# Patient Record
Sex: Female | Born: 1950
Health system: Southern US, Community
[De-identification: ages and names within clinical notes are randomized; demographics above are authoritative.]

## PROBLEM LIST (undated history)

## (undated) DIAGNOSIS — R7611 Nonspecific reaction to tuberculin skin test without active tuberculosis: Secondary | ICD-10-CM

## (undated) DIAGNOSIS — K635 Polyp of colon: Secondary | ICD-10-CM

## (undated) DIAGNOSIS — E7800 Pure hypercholesterolemia, unspecified: Secondary | ICD-10-CM

## (undated) DIAGNOSIS — M1990 Unspecified osteoarthritis, unspecified site: Secondary | ICD-10-CM

## (undated) DIAGNOSIS — J45909 Unspecified asthma, uncomplicated: Secondary | ICD-10-CM

## (undated) DIAGNOSIS — H269 Unspecified cataract: Secondary | ICD-10-CM

## (undated) DIAGNOSIS — I10 Essential (primary) hypertension: Secondary | ICD-10-CM

## (undated) HISTORY — DX: Nonspecific reaction to tuberculin skin test without active tuberculosis: R76.11

## (undated) HISTORY — DX: Unspecified cataract: H26.9

## (undated) HISTORY — DX: Unspecified osteoarthritis, unspecified site: M19.90

## (undated) HISTORY — DX: Unspecified asthma, uncomplicated: J45.909

## (undated) HISTORY — PX: KNEE SURGERY: SHX244

## (undated) HISTORY — DX: Polyp of colon: K63.5

## (undated) HISTORY — PX: TUBAL LIGATION: SHX77

---

## 1997-11-30 ENCOUNTER — Encounter: Admission: RE | Admit: 1997-11-30 | Discharge: 1997-11-30 | Payer: Self-pay | Admitting: *Deleted

## 2002-01-20 ENCOUNTER — Ambulatory Visit (HOSPITAL_COMMUNITY): Admission: RE | Admit: 2002-01-20 | Discharge: 2002-01-20 | Payer: Self-pay | Admitting: Family Medicine

## 2003-01-16 ENCOUNTER — Ambulatory Visit (HOSPITAL_COMMUNITY): Admission: RE | Admit: 2003-01-16 | Discharge: 2003-01-16 | Payer: Self-pay | Admitting: Internal Medicine

## 2003-01-16 ENCOUNTER — Encounter: Payer: Self-pay | Admitting: Internal Medicine

## 2003-02-27 ENCOUNTER — Emergency Department (HOSPITAL_COMMUNITY): Admission: EM | Admit: 2003-02-27 | Discharge: 2003-02-27 | Payer: Self-pay | Admitting: Emergency Medicine

## 2003-09-26 ENCOUNTER — Emergency Department (HOSPITAL_COMMUNITY): Admission: EM | Admit: 2003-09-26 | Discharge: 2003-09-27 | Payer: Self-pay | Admitting: Emergency Medicine

## 2004-01-03 ENCOUNTER — Ambulatory Visit: Payer: Self-pay | Admitting: *Deleted

## 2004-10-05 ENCOUNTER — Emergency Department (HOSPITAL_COMMUNITY): Admission: EM | Admit: 2004-10-05 | Discharge: 2004-10-06 | Payer: Self-pay | Admitting: Emergency Medicine

## 2004-10-14 ENCOUNTER — Emergency Department (HOSPITAL_COMMUNITY): Admission: EM | Admit: 2004-10-14 | Discharge: 2004-10-14 | Payer: Self-pay | Admitting: Emergency Medicine

## 2004-10-17 ENCOUNTER — Ambulatory Visit: Payer: Self-pay | Admitting: Nurse Practitioner

## 2004-10-24 ENCOUNTER — Ambulatory Visit: Payer: Self-pay | Admitting: Nurse Practitioner

## 2004-10-25 ENCOUNTER — Ambulatory Visit (HOSPITAL_COMMUNITY): Admission: RE | Admit: 2004-10-25 | Discharge: 2004-10-25 | Payer: Self-pay | Admitting: Internal Medicine

## 2004-11-05 ENCOUNTER — Ambulatory Visit: Payer: Self-pay | Admitting: Nurse Practitioner

## 2005-08-29 ENCOUNTER — Ambulatory Visit: Payer: Self-pay | Admitting: Internal Medicine

## 2005-09-04 ENCOUNTER — Ambulatory Visit (HOSPITAL_COMMUNITY): Admission: RE | Admit: 2005-09-04 | Discharge: 2005-09-04 | Payer: Self-pay | Admitting: Internal Medicine

## 2005-09-22 ENCOUNTER — Ambulatory Visit: Payer: Self-pay | Admitting: Nurse Practitioner

## 2005-10-13 ENCOUNTER — Ambulatory Visit: Payer: Self-pay | Admitting: Internal Medicine

## 2005-10-31 ENCOUNTER — Ambulatory Visit: Payer: Self-pay | Admitting: Nurse Practitioner

## 2005-11-01 ENCOUNTER — Emergency Department (HOSPITAL_COMMUNITY): Admission: EM | Admit: 2005-11-01 | Discharge: 2005-11-01 | Payer: Self-pay | Admitting: Emergency Medicine

## 2005-11-03 ENCOUNTER — Emergency Department (HOSPITAL_COMMUNITY): Admission: EM | Admit: 2005-11-03 | Discharge: 2005-11-03 | Payer: Self-pay | Admitting: Emergency Medicine

## 2005-11-11 ENCOUNTER — Ambulatory Visit (HOSPITAL_COMMUNITY): Admission: RE | Admit: 2005-11-11 | Discharge: 2005-11-11 | Payer: Self-pay | Admitting: Chiropractic Medicine

## 2006-01-15 ENCOUNTER — Ambulatory Visit: Payer: Self-pay | Admitting: Nurse Practitioner

## 2006-08-20 ENCOUNTER — Emergency Department (HOSPITAL_COMMUNITY): Admission: EM | Admit: 2006-08-20 | Discharge: 2006-08-20 | Payer: Self-pay | Admitting: Emergency Medicine

## 2007-04-06 ENCOUNTER — Encounter (INDEPENDENT_AMBULATORY_CARE_PROVIDER_SITE_OTHER): Payer: Self-pay | Admitting: Internal Medicine

## 2007-04-06 ENCOUNTER — Ambulatory Visit: Payer: Self-pay | Admitting: Internal Medicine

## 2007-04-06 ENCOUNTER — Ambulatory Visit (HOSPITAL_COMMUNITY): Admission: RE | Admit: 2007-04-06 | Discharge: 2007-04-06 | Payer: Self-pay | Admitting: Internal Medicine

## 2007-04-06 DIAGNOSIS — M25562 Pain in left knee: Secondary | ICD-10-CM

## 2007-04-06 DIAGNOSIS — I10 Essential (primary) hypertension: Secondary | ICD-10-CM

## 2007-04-06 DIAGNOSIS — E785 Hyperlipidemia, unspecified: Secondary | ICD-10-CM | POA: Insufficient documentation

## 2007-04-06 DIAGNOSIS — E7800 Pure hypercholesterolemia, unspecified: Secondary | ICD-10-CM

## 2007-04-06 DIAGNOSIS — M25569 Pain in unspecified knee: Secondary | ICD-10-CM

## 2007-04-06 HISTORY — DX: Pain in left knee: M25.562

## 2007-04-06 HISTORY — DX: Essential (primary) hypertension: I10

## 2007-04-06 HISTORY — DX: Pure hypercholesterolemia, unspecified: E78.00

## 2007-04-06 LAB — CONVERTED CEMR LAB
AST: 24 units/L (ref 0–37)
Albumin: 4.2 g/dL (ref 3.5–5.2)
CO2: 26 meq/L (ref 19–32)
Calcium: 9.9 mg/dL (ref 8.4–10.5)
Creatinine, Urine: 93.3 mg/dL
Glucose, Bld: 74 mg/dL (ref 70–99)
HCT: 37.6 % (ref 36.0–46.0)
Microalb, Ur: 0.33 mg/dL (ref 0.00–1.89)
Platelets: 277 10*3/uL (ref 150–400)
RBC: 4.23 M/uL (ref 3.87–5.11)
Sodium: 139 meq/L (ref 135–145)
Total CHOL/HDL Ratio: 2.7
VLDL: 9 mg/dL (ref 0–40)
Vit D, 1,25-Dihydroxy: 47 (ref 30–89)

## 2007-04-13 ENCOUNTER — Ambulatory Visit (HOSPITAL_COMMUNITY): Admission: RE | Admit: 2007-04-13 | Discharge: 2007-04-13 | Payer: Self-pay | Admitting: Internal Medicine

## 2007-05-06 ENCOUNTER — Encounter (INDEPENDENT_AMBULATORY_CARE_PROVIDER_SITE_OTHER): Payer: Self-pay | Admitting: Internal Medicine

## 2007-06-24 ENCOUNTER — Telehealth: Payer: Self-pay | Admitting: *Deleted

## 2007-07-07 LAB — CONVERTED CEMR LAB: Pap Smear: NORMAL

## 2007-07-20 ENCOUNTER — Telehealth (INDEPENDENT_AMBULATORY_CARE_PROVIDER_SITE_OTHER): Payer: Self-pay | Admitting: *Deleted

## 2007-09-03 ENCOUNTER — Ambulatory Visit: Payer: Self-pay | Admitting: Internal Medicine

## 2007-09-03 ENCOUNTER — Encounter (INDEPENDENT_AMBULATORY_CARE_PROVIDER_SITE_OTHER): Payer: Self-pay | Admitting: Internal Medicine

## 2007-09-06 LAB — CONVERTED CEMR LAB
Alkaline Phosphatase: 85 units/L (ref 39–117)
CO2: 28 meq/L (ref 19–32)
Calcium: 9.3 mg/dL (ref 8.4–10.5)
Creatinine, Ser: 1.11 mg/dL (ref 0.40–1.20)
Glucose, Bld: 87 mg/dL (ref 70–99)
HCT: 36.8 % (ref 36.0–46.0)
Hemoglobin: 12 g/dL (ref 12.0–15.0)
MCV: 89.5 fL (ref 78.0–100.0)
Platelets: 281 10*3/uL (ref 150–400)
Potassium: 4.7 meq/L (ref 3.5–5.3)
RBC: 4.11 M/uL (ref 3.87–5.11)
Sodium: 142 meq/L (ref 135–145)

## 2007-10-25 ENCOUNTER — Encounter (INDEPENDENT_AMBULATORY_CARE_PROVIDER_SITE_OTHER): Payer: Self-pay | Admitting: Internal Medicine

## 2007-11-17 ENCOUNTER — Telehealth: Payer: Self-pay | Admitting: *Deleted

## 2008-01-14 ENCOUNTER — Telehealth (INDEPENDENT_AMBULATORY_CARE_PROVIDER_SITE_OTHER): Payer: Self-pay | Admitting: Internal Medicine

## 2009-03-12 ENCOUNTER — Ambulatory Visit: Payer: Self-pay | Admitting: Vascular Surgery

## 2009-03-12 ENCOUNTER — Encounter (INDEPENDENT_AMBULATORY_CARE_PROVIDER_SITE_OTHER): Payer: Self-pay | Admitting: Emergency Medicine

## 2009-03-12 ENCOUNTER — Emergency Department (HOSPITAL_COMMUNITY): Admission: EM | Admit: 2009-03-12 | Discharge: 2009-03-12 | Payer: Self-pay | Admitting: Emergency Medicine

## 2009-04-04 ENCOUNTER — Encounter (INDEPENDENT_AMBULATORY_CARE_PROVIDER_SITE_OTHER): Payer: Self-pay | Admitting: Adult Health

## 2009-04-04 ENCOUNTER — Ambulatory Visit: Payer: Self-pay | Admitting: Internal Medicine

## 2009-04-04 LAB — CONVERTED CEMR LAB
AST: 20 units/L (ref 0–37)
BUN: 9 mg/dL (ref 6–23)
CO2: 24 meq/L (ref 19–32)
Chloride: 102 meq/L (ref 96–112)
Creatinine, Ser: 1.02 mg/dL (ref 0.40–1.20)

## 2009-04-06 ENCOUNTER — Ambulatory Visit: Payer: Self-pay | Admitting: Internal Medicine

## 2009-04-11 ENCOUNTER — Ambulatory Visit (HOSPITAL_COMMUNITY): Admission: RE | Admit: 2009-04-11 | Discharge: 2009-04-11 | Payer: Self-pay | Admitting: Internal Medicine

## 2009-04-30 ENCOUNTER — Encounter: Admission: RE | Admit: 2009-04-30 | Discharge: 2009-04-30 | Payer: Self-pay | Admitting: Infectious Diseases

## 2009-05-25 ENCOUNTER — Ambulatory Visit: Payer: Self-pay | Admitting: Internal Medicine

## 2009-07-11 ENCOUNTER — Ambulatory Visit: Payer: Self-pay | Admitting: Internal Medicine

## 2009-08-13 ENCOUNTER — Ambulatory Visit: Payer: Self-pay | Admitting: Family Medicine

## 2009-08-13 LAB — CONVERTED CEMR LAB
AST: 30 units/L (ref 0–37)
Albumin: 4.1 g/dL (ref 3.5–5.2)
BUN: 14 mg/dL (ref 6–23)
Chloride: 104 meq/L (ref 96–112)
Glucose, Bld: 87 mg/dL (ref 70–99)
Potassium: 4.7 meq/L (ref 3.5–5.3)
Total CHOL/HDL Ratio: 2.8
Total Protein: 7 g/dL (ref 6.0–8.3)
Triglycerides: 48 mg/dL (ref <150)

## 2009-11-02 ENCOUNTER — Ambulatory Visit: Payer: Self-pay | Admitting: Internal Medicine

## 2009-11-02 ENCOUNTER — Encounter (INDEPENDENT_AMBULATORY_CARE_PROVIDER_SITE_OTHER): Payer: Self-pay | Admitting: Internal Medicine

## 2009-11-02 LAB — CONVERTED CEMR LAB
BUN: 19 mg/dL (ref 6–23)
CO2: 25 meq/L (ref 19–32)
HDL: 73 mg/dL (ref >39)
Magnesium: 1.9 mg/dL (ref 1.5–2.5)
VLDL: 9 mg/dL (ref 0–40)

## 2009-11-19 ENCOUNTER — Ambulatory Visit: Payer: Self-pay | Admitting: Internal Medicine

## 2009-11-19 LAB — CONVERTED CEMR LAB
BUN: 16 mg/dL (ref 6–23)
CO2: 29 meq/L (ref 19–32)
Creatinine, Ser: 0.95 mg/dL (ref 0.40–1.20)
Glucose, Bld: 79 mg/dL (ref 70–99)
Magnesium: 1.9 mg/dL (ref 1.5–2.5)
Potassium: 3.3 meq/L — ABNORMAL LOW (ref 3.5–5.3)
Sodium: 139 meq/L (ref 135–145)

## 2010-04-14 ENCOUNTER — Encounter: Payer: Self-pay | Admitting: Obstetrics & Gynecology

## 2010-04-14 ENCOUNTER — Encounter: Payer: Self-pay | Admitting: Infectious Diseases

## 2011-04-17 ENCOUNTER — Other Ambulatory Visit: Payer: Self-pay

## 2011-04-17 ENCOUNTER — Encounter (HOSPITAL_COMMUNITY): Payer: Self-pay | Admitting: Emergency Medicine

## 2011-04-17 ENCOUNTER — Emergency Department (HOSPITAL_COMMUNITY)
Admission: EM | Admit: 2011-04-17 | Discharge: 2011-04-17 | Disposition: A | Discharge status: Home / Self Care | Payer: Self-pay | Attending: Emergency Medicine | Admitting: Emergency Medicine

## 2011-04-17 ENCOUNTER — Emergency Department (HOSPITAL_COMMUNITY): Payer: Self-pay

## 2011-04-17 DIAGNOSIS — M25512 Pain in left shoulder: Secondary | ICD-10-CM

## 2011-04-17 DIAGNOSIS — M25519 Pain in unspecified shoulder: Secondary | ICD-10-CM | POA: Insufficient documentation

## 2011-04-17 DIAGNOSIS — I10 Essential (primary) hypertension: Secondary | ICD-10-CM | POA: Insufficient documentation

## 2011-04-17 DIAGNOSIS — R51 Headache: Secondary | ICD-10-CM | POA: Insufficient documentation

## 2011-04-17 LAB — POCT I-STAT, CHEM 8
Calcium, Ion: 1.06 mmol/L — ABNORMAL LOW (ref 1.12–1.32)
Chloride: 111 mEq/L (ref 96–112)
Glucose, Bld: 87 mg/dL (ref 70–99)
HCT: 43 % (ref 36.0–46.0)
Hemoglobin: 14.6 g/dL (ref 12.0–15.0)

## 2011-04-17 MED ORDER — HYDROCHLOROTHIAZIDE 25 MG PO TABS
25.0000 mg | ORAL_TABLET | ORAL | Status: DC
  Filled 2011-04-17: qty 1

## 2011-04-17 MED ORDER — HYDROCHLOROTHIAZIDE 25 MG PO TABS
25.0000 mg | ORAL_TABLET | Freq: Every day | ORAL | Status: DC
  Administered 2011-04-17: 25 mg via ORAL

## 2011-04-17 MED ORDER — ACETAMINOPHEN 325 MG PO TABS
650.0000 mg | ORAL_TABLET | Freq: Once | ORAL | Status: AC
  Administered 2011-04-17: 650 mg via ORAL
  Filled 2011-04-17: qty 2

## 2011-04-17 MED ORDER — HYDROCHLOROTHIAZIDE 25 MG PO TABS: 37.5000 mg | ORAL_TABLET | Freq: Every day | ORAL | Status: DC

## 2011-04-17 NOTE — ED Notes (Signed)
Per EMS - patient was at [facility] to have BP checked. Patient non-compliant on BP med X 2 weeks. Left Should radiating it to low back x 2 weeks. BP 160/118 HR 110, HR 90.

## 2011-04-17 NOTE — ED Notes (Signed)
Gave pt turkey sandwich and apple juice 

## 2011-04-17 NOTE — ED Provider Notes (Signed)
History     CSN: [id number]  Arrival date & time 04/17/11  4098   First MD Initiated Contact with Patient 04/17/11 1007      Chief Complaint  Patient presents with  . Shoulder Pain    (Consider location/radiation/quality/duration/timing/severity/associated sxs/prior treatment) HPI Comments: 61yo AAF with PMH significant for HTN who presents to the ED via EMS from [facility] due to hypertension. Patient just went today for refill on her HCTZ. She moved from [place] a while ago and her regular doctor is there. She went to clinic today just for a medication refill.   Patient is a 61 y.o. female presenting with shoulder pain and hypertension. The history is provided by the patient and the EMS personnel.  Shoulder Pain This is a new problem. Associated symptoms include arthralgias (left shoulder pain worse with movement and worse when sleeping on that side) and headaches (intermittent mild headache over the last few weeks). Pertinent negatives include no abdominal pain, anorexia, chest pain, chills, congestion, coughing, diaphoresis, fatigue, fever, joint swelling, myalgias, nausea, neck pain, numbness, rash, sore throat, vertigo, visual change, vomiting or weakness.  Hypertension This is a chronic problem. Episode onset: years ago. The problem occurs constantly. The problem has been unchanged. Associated symptoms include arthralgias (left shoulder pain worse with movement and worse when sleeping on that side) and headaches (intermittent mild headache over the last few weeks). Pertinent negatives include no abdominal pain, anorexia, chest pain, chills, congestion, coughing, diaphoresis, fatigue, fever, joint swelling, myalgias, nausea, neck pain, numbness, rash, sore throat, vertigo, visual change, vomiting or weakness. The symptoms are aggravated by nothing. She has tried nothing for the symptoms.    No past medical history on file.  No past surgical history on file.  No family  history on file.  History  Substance Use Topics  . Smoking status: Not on file  . Smokeless tobacco: Not on file  . Alcohol Use: Not on file    OB History    No data available      Review of Systems  Constitutional: Negative for fever, chills, diaphoresis, activity change, appetite change and fatigue.  HENT: Negative for congestion, sore throat, rhinorrhea, neck pain and neck stiffness.   Eyes: Negative for photophobia, redness and visual disturbance.  Respiratory: Negative for cough, shortness of breath and wheezing.   Cardiovascular: Negative for chest pain, palpitations and leg swelling.  Gastrointestinal: Negative for nausea, vomiting, abdominal pain, diarrhea, constipation, blood in stool and anorexia.  Genitourinary: Negative for dysuria, urgency, hematuria and flank pain.  Musculoskeletal: Positive for arthralgias (left shoulder pain worse with movement and worse when sleeping on that side). Negative for myalgias, back pain and joint swelling.  Skin: Negative for rash and wound.  Neurological: Positive for headaches (intermittent mild headache over the last few weeks). Negative for dizziness, vertigo, seizures, syncope, facial asymmetry, speech difficulty, weakness, light-headedness and numbness.  Psychiatric/Behavioral: Negative for confusion.  All other systems reviewed and are negative.    Allergies  Review of patient's allergies indicates no known allergies.  Home Medications  No current outpatient prescriptions on file.  BP 166/93  Pulse 65  Temp(Src) 98 F (36.7 C) (Oral)  Resp 18  SpO2 100%  Physical Exam  Nursing note and vitals reviewed. Constitutional: She is oriented to person, place, and time. She appears well-developed and well-nourished.  Non-toxic appearance. No distress.  HENT:  Head: Normocephalic and atraumatic.  Mouth/Throat: Oropharynx is clear and moist.  Eyes: Conjunctivae and EOM are normal. Pupils  are equal, round, and reactive to light.  No scleral icterus.  Neck: Normal range of motion, full passive range of motion without pain and phonation normal. Neck supple. No JVD present.  Cardiovascular: Normal rate, regular rhythm, normal heart sounds and intact distal pulses.   No murmur heard. Pulmonary/Chest: Effort normal and breath sounds normal. No respiratory distress. She has no wheezes. She has no rales.  Abdominal: Soft. Bowel sounds are normal. She exhibits no distension. There is no tenderness. There is no rebound and no guarding.  Musculoskeletal: Normal range of motion.       Left shoulder: She exhibits pain (mild pain with active and passive ROM with internal rotation and overhead motions). She exhibits no tenderness, no bony tenderness, no swelling, no effusion, no crepitus, no deformity, no laceration and normal strength.       Left elbow: Normal.       Left upper arm: Normal.  Neurological: She is alert and oriented to person, place, and time. She has normal strength. No cranial nerve deficit or sensory deficit. Coordination normal. GCS eye subscore is 4. GCS verbal subscore is 5. GCS motor subscore is 6.       No pronator drift. Normal finger-to-nose.   Skin: Skin is warm and dry. No rash noted. She is not diaphoretic.  Psychiatric: She has a normal mood and affect.    ED Course  Procedures (including critical care time)  ED ECG REPORT   Date: 04/17/2011  EKG Time: 11:29 AM  Rate: 59  Rhythm: sinus bradycardia,  there are no previous tracings available for comparison, sinus bradycardia  Axis: left  Intervals:none  ST&T Change: Twave flattening in lead III only.  Narrative Interpretation: Sinus bradycardia. No acute abnormality.              Labs Reviewed  POCT I-STAT, CHEM 8 - Abnormal; Notable for the following:    Creatinine, Ser 1.20 (*)    Calcium, Ion 1.06 (*)    All other components within normal limits  I-STAT, CHEM 8   Dg Chest 2 View  04/17/2011  *RADIOLOGY REPORT*  Clinical Data:  Hypertension and weakness.  CHEST - 2 VIEW  Comparison: 04/11/2009.  Findings: Trachea is midline.  Heart size normal.  Linear scarring in the lingula.  Lungs are otherwise clear.  No pleural fluid.  IMPRESSION: No acute findings.  Original Report Authenticated By: Reyes Ivan, M.D.     1. Hypertension   2. Left shoulder pain       MDM  Recheck BP at 10:22 AM 154/88, HR 70, 02 sat 100%  61yo AAF with PMH significant for HTN who presents to the ED via EMS from [facility] due to hypertension. Patient just went today for refill on her HCTZ. She moved from [place] a while ago and her regular doctor is there. She went to clinic today just for a medication refill. Her BP was 160/118 on ROS she told them she had a mild headache and left shoulder pain. Pt states that for several weeks now she has been having intermittent mild dull headaches with no sensory or motor losses. No vision changes. No nausea or vomiting. Headaches only mild and are gradual onset. Pt states that for several weeks she has also had some mild left shoulder pain. Pain is worse with lifting objects with her left arm and worse with overhead motions. Pt neurologically intact. Repeat bp here 154 then 150. She has been out of her meds for  several months but his is not consistent with hypertensive emergency. Giving tylenol for headache and shoulder pain. Shoulder atraumatic likely rotator cuff problem vs impingement. Will check renal fxn CXR and EKG. Giving HCTZ now.   Creatinine a little elevated at 1.2 but not c/w ARF.   EKG WNL.   At 11:38 AM repeat BP 145/88. Pt on 37.5mg  HCTZ daily will d/c with script for same and instructed her to f/u with [facility].   Verne Carrow, MD 04/17/11 [phone]

## 2011-04-17 NOTE — ED Provider Notes (Signed)
I saw and evaluated the patient, reviewed the resident's note and I agree with the findings and plan.  Cyndra Numbers, MD 04/17/11 2242

## 2011-04-17 NOTE — ED Notes (Signed)
Case managment at bedside

## 2011-04-17 NOTE — ED Notes (Signed)
Called Thurston Hole (case management) at pt request for med assistance

## 2011-04-17 NOTE — ED Notes (Addendum)
Patient states she went the get re-certified for [facility] and she noticed a slight headache and left shoulder pain radiating to her lower back and SOB when walking up and down stairs. Patient states her BP ws very high at [facility] and they called EMS to transport to the ED. Patient states left shoulder pain x 2 weeks and low back pain for x 2 years. Patient denies chest pain, n/v and denies fever. 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 Patient placed on monitor and oxygen saturation of 99% on RA.

## 2011-08-24 ENCOUNTER — Ambulatory Visit (HOSPITAL_COMMUNITY)
Admission: RE | Admit: 2011-08-24 | Discharge: 2011-08-24 | Disposition: A | Discharge status: Home / Self Care | Payer: Medicare Other | Source: Ambulatory Visit | Attending: Emergency Medicine | Admitting: Emergency Medicine

## 2011-08-24 ENCOUNTER — Encounter (HOSPITAL_COMMUNITY): Payer: Self-pay | Admitting: *Deleted

## 2011-08-24 ENCOUNTER — Emergency Department (HOSPITAL_COMMUNITY)
Admission: EM | Admit: 2011-08-24 | Discharge: 2011-08-24 | Disposition: A | Discharge status: Home / Self Care | Payer: Medicare Other | Attending: Emergency Medicine | Admitting: Emergency Medicine

## 2011-08-24 DIAGNOSIS — M79609 Pain in unspecified limb: Secondary | ICD-10-CM | POA: Insufficient documentation

## 2011-08-24 DIAGNOSIS — E7800 Pure hypercholesterolemia, unspecified: Secondary | ICD-10-CM | POA: Insufficient documentation

## 2011-08-24 DIAGNOSIS — M79606 Pain in leg, unspecified: Secondary | ICD-10-CM

## 2011-08-24 DIAGNOSIS — I10 Essential (primary) hypertension: Secondary | ICD-10-CM | POA: Insufficient documentation

## 2011-08-24 HISTORY — DX: Essential (primary) hypertension: I10

## 2011-08-24 HISTORY — DX: Pure hypercholesterolemia, unspecified: E78.00

## 2011-08-24 MED ORDER — ACETAMINOPHEN-CODEINE #3 300-30 MG PO TABS
1.0000 | ORAL_TABLET | Freq: Once | ORAL | Status: AC
  Administered 2011-08-24: 1 via ORAL
  Filled 2011-08-24: qty 1

## 2011-08-24 MED ORDER — RIVAROXABAN 10 MG PO TABS
20.0000 mg | ORAL_TABLET | Freq: Every day | ORAL | Status: DC
  Administered 2011-08-24: 20 mg via ORAL
  Filled 2011-08-24: qty 2

## 2011-08-24 MED ORDER — NAPROXEN 375 MG PO TABS: 375.0000 mg | ORAL_TABLET | Freq: Two times a day (BID) | ORAL | Status: AC

## 2011-08-24 MED ORDER — ACETAMINOPHEN-CODEINE #3 300-30 MG PO TABS: 1.0000 | ORAL_TABLET | Freq: Four times a day (QID) | ORAL | Status: AC | PRN

## 2011-08-24 NOTE — ED Provider Notes (Signed)
61 year old female with a history of left total knee arthroplasty presents with right leg pain. Pain is behind the right calf and right knee, persistent, gradually getting worse over 2 months and associated with subjective swelling. The patient denies any chest pain shortness of breath fevers rashes redness or warmth to the leg.  Physical exam shows that the patient has no asymmetry of the legs, no pitting edema, no tenderness to palpation over the calf, and no Homans sign, slight decreased range of motion of the right knee with flexion secondary to pain. Normal pulses sensation and capillary refill of the right foot.  Assessment the patient is well-appearing, possible new onset deep venous thrombosis, otherwise possibly arthritis. He she will be given anticoagulation in the emergency department and sent for an ultrasound in the morning, she has accepted these discharge instructions and has expressed her understanding.  Medical screening examination/treatment/procedure(s) were conducted as a shared visit with non-physician practitioner(s) and myself.  I personally evaluated the patient during the encounter    Vida Roller, MD 08/24/11 [phone]

## 2011-08-24 NOTE — Discharge Instructions (Signed)
Follow up at the vascular lab tomorrow as directed to have a vascular duplex ultra sound to rule out a clot in your lower leg. If no clot then take naprosyn as directed for inflammation and pain using tylenol #3 for breakthrough pain but do not drive or operate machinery with tylenol #3 use. Follow up with your primary care provider for recheck of ongoing leg pain but return to ER for emergent changing or worsening of symptoms.

## 2011-08-24 NOTE — ED Notes (Signed)
PT states understanding discharge instructions

## 2011-08-24 NOTE — ED Notes (Signed)
C/o pain in the back of her leg (from her knee to her ankle) for several months.

## 2011-08-24 NOTE — ED Provider Notes (Signed)
History     CSN: [id number]  Arrival date & time 08/24/11  0347   First MD Initiated Contact with Patient 08/24/11 0357      Chief Complaint  Patient presents with  . Leg Pain    (Consider location/radiation/quality/duration/timing/severity/associated sxs/prior treatment) HPI  Patient presents to ER complaining of a 2 month of of pain in posterior right calf from knee down to heel that she states is aggravated by prolonged standing and walking but also aggravated if she sits still for a long period of time and then gets up to walk. Patient has not been taking anything at home for pain and has not had leg evaluated by PCP prior to arrival. [facility]. Denies extremity swelling, skin changes, redness or break in skin. Denies CP or SOB.   Past Medical History  Diagnosis Date  . Hypertension   . High cholesterol     Past Surgical History  Procedure Date  . Knee surgery     No family history on file.  History  Substance Use Topics  . Smoking status: Never Smoker   . Smokeless tobacco: Not on file  . Alcohol Use: No    OB History    Grav Para Term Preterm Abortions TAB SAB Ect Mult Living                  Review of Systems  All other systems reviewed and are negative.    Allergies  Review of patient's allergies indicates no known allergies.  Home Medications  No current outpatient prescriptions on file.  BP 135/67  Pulse 59  Temp 97.9 F (36.6 C)  Resp 20  SpO2 98%  Physical Exam  Nursing note and vitals reviewed. Constitutional: She is oriented to person, place, and time. She appears well-developed and well-nourished. No distress.  HENT:  Head: Normocephalic and atraumatic.  Eyes: Conjunctivae are normal.  Neck: Normal range of motion. Neck supple.  Cardiovascular: Normal rate, regular rhythm, normal heart sounds and intact distal pulses.  Exam reveals no gallop and no friction rub.   No murmur  heard. Pulmonary/Chest: Effort normal and breath sounds normal. No respiratory distress. She has no wheezes. She has no rales. She exhibits no tenderness.  Abdominal: Soft. Bowel sounds are normal. She exhibits no distension and no mass. There is no tenderness. There is no rebound and no guarding.  Musculoskeletal: Normal range of motion. She exhibits tenderness. She exhibits no edema.       Mild TTP of right calf but no skin changes, edema or erythema. FROM of right knee and ankle with little to no pain. Good pedal pulse and cap refill bilaterally with normal sensation.   Neurological: She is alert and oriented to person, place, and time.  Skin: Skin is warm and dry. No rash noted. She is not diaphoretic. No erythema.  Psychiatric: She has a normal mood and affect.    ED Course  Procedures (including critical care time)  PO xarelto and tylenol #3  Labs Reviewed - No data to display No results found.   1. Lower extremity pain       MDM  Will have DVT ruled out at vascular lab tomorrow but question MSK pain. No signs or symptoms of cellulitis or infection. RLE is neurovasc intact with normal sensation and pulses bilaterally.         [place], [place] 08/24/11 [phone]

## 2011-08-25 DIAGNOSIS — M79609 Pain in unspecified limb: Secondary | ICD-10-CM

## 2011-08-25 NOTE — Progress Notes (Signed)
VASCULAR LAB PRELIMINARY  PRELIMINARY  PRELIMINARY  PRELIMINARY  Right lower extremity venous duplex completed.    Preliminary report:  Right:  No evidence of DVT, superficial thrombosis, or Baker's cyst.  Cynthia Montgomery D, RVS 08/25/2011, 9:43 AM

## 2013-07-19 DIAGNOSIS — M1711 Unilateral primary osteoarthritis, right knee: Secondary | ICD-10-CM | POA: Insufficient documentation

## 2013-07-19 DIAGNOSIS — M179 Osteoarthritis of knee, unspecified: Secondary | ICD-10-CM | POA: Insufficient documentation

## 2013-07-19 HISTORY — DX: Unilateral primary osteoarthritis, right knee: M17.11

## 2013-09-26 DIAGNOSIS — R609 Edema, unspecified: Secondary | ICD-10-CM

## 2013-09-26 DIAGNOSIS — E876 Hypokalemia: Secondary | ICD-10-CM

## 2013-09-26 DIAGNOSIS — M5136 Other intervertebral disc degeneration, lumbar region: Secondary | ICD-10-CM

## 2013-09-26 DIAGNOSIS — M51369 Other intervertebral disc degeneration, lumbar region without mention of lumbar back pain or lower extremity pain: Secondary | ICD-10-CM

## 2013-09-26 DIAGNOSIS — M25569 Pain in unspecified knee: Secondary | ICD-10-CM

## 2013-09-26 DIAGNOSIS — E669 Obesity, unspecified: Secondary | ICD-10-CM | POA: Insufficient documentation

## 2013-09-26 DIAGNOSIS — K219 Gastro-esophageal reflux disease without esophagitis: Secondary | ICD-10-CM | POA: Insufficient documentation

## 2013-09-26 DIAGNOSIS — M1990 Unspecified osteoarthritis, unspecified site: Secondary | ICD-10-CM

## 2013-09-26 HISTORY — DX: Unspecified osteoarthritis, unspecified site: M19.90

## 2013-09-26 HISTORY — DX: Pain in unspecified knee: M25.569

## 2013-09-26 HISTORY — DX: Morbid (severe) obesity due to excess calories: E66.01

## 2013-09-26 HISTORY — DX: Edema, unspecified: R60.9

## 2013-09-26 HISTORY — DX: Obesity, unspecified: E66.9

## 2013-09-26 HISTORY — DX: Other intervertebral disc degeneration, lumbar region: M51.36

## 2013-09-26 HISTORY — DX: Other intervertebral disc degeneration, lumbar region without mention of lumbar back pain or lower extremity pain: M51.369

## 2013-09-26 HISTORY — DX: Gastro-esophageal reflux disease without esophagitis: K21.9

## 2013-09-26 HISTORY — DX: Hypokalemia: E87.6

## 2013-10-11 DIAGNOSIS — M24661 Ankylosis, right knee: Secondary | ICD-10-CM

## 2013-10-11 HISTORY — DX: Ankylosis, right knee: M24.661

## 2013-12-15 DIAGNOSIS — Z96651 Presence of right artificial knee joint: Secondary | ICD-10-CM

## 2013-12-15 HISTORY — DX: Presence of right artificial knee joint: Z96.651

## 2014-07-13 DIAGNOSIS — H2512 Age-related nuclear cataract, left eye: Secondary | ICD-10-CM | POA: Insufficient documentation

## 2014-07-13 DIAGNOSIS — R7611 Nonspecific reaction to tuberculin skin test without active tuberculosis: Secondary | ICD-10-CM | POA: Insufficient documentation

## 2014-07-13 HISTORY — DX: Age-related nuclear cataract, left eye: H25.12

## 2015-06-15 DIAGNOSIS — M47817 Spondylosis without myelopathy or radiculopathy, lumbosacral region: Secondary | ICD-10-CM | POA: Insufficient documentation

## 2015-06-15 HISTORY — DX: Spondylosis without myelopathy or radiculopathy, lumbosacral region: M47.817

## 2016-02-27 DIAGNOSIS — R011 Cardiac murmur, unspecified: Secondary | ICD-10-CM

## 2016-02-27 DIAGNOSIS — F17211 Nicotine dependence, cigarettes, in remission: Secondary | ICD-10-CM | POA: Insufficient documentation

## 2016-02-27 HISTORY — DX: Nicotine dependence, cigarettes, in remission: F17.211

## 2016-02-27 HISTORY — DX: Cardiac murmur, unspecified: R01.1

## 2016-03-24 HISTORY — PX: CATARACT EXTRACTION, BILATERAL: SHX1313

## 2016-08-26 DIAGNOSIS — T8484XA Pain due to internal orthopedic prosthetic devices, implants and grafts, initial encounter: Secondary | ICD-10-CM | POA: Insufficient documentation

## 2016-08-26 HISTORY — DX: Pain due to internal orthopedic prosthetic devices, implants and grafts, initial encounter: T84.84XA

## 2017-01-24 DIAGNOSIS — J45909 Unspecified asthma, uncomplicated: Secondary | ICD-10-CM | POA: Insufficient documentation

## 2017-01-24 DIAGNOSIS — N179 Acute kidney failure, unspecified: Secondary | ICD-10-CM | POA: Insufficient documentation

## 2017-01-24 DIAGNOSIS — R7401 Elevation of levels of liver transaminase levels: Secondary | ICD-10-CM | POA: Insufficient documentation

## 2017-01-24 DIAGNOSIS — R55 Syncope and collapse: Secondary | ICD-10-CM

## 2017-01-24 HISTORY — DX: Elevation of levels of liver transaminase levels: R74.01

## 2017-01-24 HISTORY — DX: Syncope and collapse: R55

## 2017-01-24 HISTORY — DX: Acute kidney failure, unspecified: N17.9

## 2017-07-27 ENCOUNTER — Encounter: Payer: Self-pay | Admitting: Family Medicine

## 2017-07-27 ENCOUNTER — Ambulatory Visit (INDEPENDENT_AMBULATORY_CARE_PROVIDER_SITE_OTHER): Payer: Medicare HMO | Admitting: Family Medicine

## 2017-07-27 VITALS — BP 128/86 | HR 70 | Temp 98.1°F | Ht 62.0 in | Wt 192.4 lb

## 2017-07-27 DIAGNOSIS — R519 Headache, unspecified: Secondary | ICD-10-CM

## 2017-07-27 DIAGNOSIS — R51 Headache: Secondary | ICD-10-CM

## 2017-07-27 HISTORY — DX: Headache, unspecified: R51.9

## 2017-07-27 NOTE — Patient Instructions (Signed)
Come fasting to your next appointment.  If you change your mind about a sleep study, let us know.  Keep the diet clean.  Consider investing in a new pillow.   EXERCISES RANGE OF MOTION (ROM) AND STRETCHING EXERCISES  These exercises may help you when beginning to rehabilitate your issue. In order to successfully resolve your symptoms, you must improve your posture. These exercises are designed to help reduce the forward-head and rounded-shoulder posture which contributes to this condition. Your symptoms may resolve with or without further involvement from your physician, physical therapist or athletic trainer. While completing these exercises, remember:   Restoring tissue flexibility helps normal motion to return to the joints. This allows healthier, less painful movement and activity.  An effective stretch should be held for at least 20 seconds, although you may need to begin with shorter hold times for comfort.  A stretch should never be painful. You should only feel a gentle lengthening or release in the stretched tissue.  Do not do any stretch or exercise that you cannot tolerate.  STRETCH- Axial Extensors  Lie on your back on the floor. You may bend your knees for comfort. Place a rolled-up hand towel or dish towel, about 2 inches in diameter, under the part of your head that makes contact with the floor.  Gently tuck your chin, as if trying to make a "double chin," until you feel a gentle stretch at the base of your head.  Hold 15-20 seconds. Repeat 2-3 times. Complete this exercise 1 time per day.   STRETCH - Axial Extension   Stand or sit on a firm surface. Assume a good posture: chest up, shoulders drawn back, abdominal muscles slightly tense, knees unlocked (if standing) and feet hip width apart.  Slowly retract your chin so your head slides back and your chin slightly lowers. Continue to look straight ahead.  You should feel a gentle stretch in the back of your head. Be  certain not to feel an aggressive stretch since this can cause headaches later.  Hold for 15-20 seconds. Repeat 2-3 times. Complete this exercise 1 time per day.  STRETCH - Cervical Side Bend   Stand or sit on a firm surface. Assume a good posture: chest up, shoulders drawn back, abdominal muscles slightly tense, knees unlocked (if standing) and feet hip width apart.  Without letting your nose or shoulders move, slowly tip your right / left ear to your shoulder until your feel a gentle stretch in the muscles on the opposite side of your neck.  Hold 15-20 seconds. Repeat 2-3 times. Complete this exercise 1-2 times per day.  STRETCH - Cervical Rotators   Stand or sit on a firm surface. Assume a good posture: chest up, shoulders drawn back, abdominal muscles slightly tense, knees unlocked (if standing) and feet hip width apart.  Keeping your eyes level with the ground, slowly turn your head until you feel a gentle stretch along the back and opposite side of your neck.  Hold 15-20 seconds. Repeat 2-3 times. Complete this exercise 1-2 times per day.  RANGE OF MOTION - Neck Circles   Stand or sit on a firm surface. Assume a good posture: chest up, shoulders drawn back, abdominal muscles slightly tense, knees unlocked (if standing) and feet hip width apart.  Gently roll your head down and around from the back of one shoulder to the back of the other. The motion should never be forced or painful.  Repeat the motion 10-20 times, or until  you feel the neck muscles relax and loosen. Repeat 2-3 times. Complete the exercise 1-2 times per day. STRENGTHENING EXERCISES - Cervical Strain and Sprain These exercises may help you when beginning to rehabilitate your injury. They may resolve your symptoms with or without further involvement from your physician, physical therapist, or athletic trainer. While completing these exercises, remember:   Muscles can gain both the endurance and the strength  needed for everyday activities through controlled exercises.  Complete these exercises as instructed by your physician, physical therapist, or athletic trainer. Progress the resistance and repetitions only as guided.  You may experience muscle soreness or fatigue, but the pain or discomfort you are trying to eliminate should never worsen during these exercises. If this pain does worsen, stop and make certain you are following the directions exactly. If the pain is still present after adjustments, discontinue the exercise until you can discuss the trouble with your clinician.  STRENGTH - Cervical Flexors, Isometric  Face a wall, standing about 6 inches away. Place a small pillow, a ball about 6-8 inches in diameter, or a folded towel between your forehead and the wall.  Slightly tuck your chin and gently push your forehead into the soft object. Push only with mild to moderate intensity, building up tension gradually. Keep your jaw and forehead relaxed.  Hold 10 to 20 seconds. Keep your breathing relaxed.  Release the tension slowly. Relax your neck muscles completely before you start the next repetition. Repeat 2-3 times. Complete this exercise 1 time per day.  STRENGTH- Cervical Lateral Flexors, Isometric   Stand about 6 inches away from a wall. Place a small pillow, a ball about 6-8 inches in diameter, or a folded towel between the side of your head and the wall.  Slightly tuck your chin and gently tilt your head into the soft object. Push only with mild to moderate intensity, building up tension gradually. Keep your jaw and forehead relaxed.  Hold 10 to 20 seconds. Keep your breathing relaxed.  Release the tension slowly. Relax your neck muscles completely before you start the next repetition. Repeat 2-3 times. Complete this exercise 1 time per day.  STRENGTH - Cervical Extensors, Isometric   Stand about 6 inches away from a wall. Place a small pillow, a ball about 6-8 inches in  diameter, or a folded towel between the back of your head and the wall.  Slightly tuck your chin and gently tilt your head back into the soft object. Push only with mild to moderate intensity, building up tension gradually. Keep your jaw and forehead relaxed.  Hold 10 to 20 seconds. Keep your breathing relaxed.  Release the tension slowly. Relax your neck muscles completely before you start the next repetition. Repeat 2-3 times. Complete this exercise 1 time per day.  POSTURE AND BODY MECHANICS CONSIDERATIONS Keeping correct posture when sitting, standing or completing your activities will reduce the stress put on different body tissues, allowing injured tissues a chance to heal and limiting painful experiences. The following are general guidelines for improved posture. Your physician or physical therapist will provide you with any instructions specific to your needs. While reading these guidelines, remember:  The exercises prescribed by your provider will help you have the flexibility and strength to maintain correct postures.  The correct posture provides the optimal environment for your joints to work. All of your joints have less wear and tear when properly supported by a spine with good posture. This means you will experience a healthier, less  painful body.  Correct posture must be practiced with all of your activities, especially prolonged sitting and standing. Correct posture is as important when doing repetitive low-stress activities (typing) as it is when doing a single heavy-load activity (lifting).  PROLONGED STANDING WHILE SLIGHTLY LEANING FORWARD When completing a task that requires you to lean forward while standing in one place for a long time, place either foot up on a stationary 2- to 4-inch high object to help maintain the best posture. When both feet are on the ground, the low back tends to lose its slight inward curve. If this curve flattens (or becomes too large), then the  back and your other joints will experience too much stress, fatigue more quickly, and can cause pain.   RESTING POSITIONS Consider which positions are most painful for you when choosing a resting position. If you have pain with flexion-based activities (sitting, bending, stooping, squatting), choose a position that allows you to rest in a less flexed posture. You would want to avoid curling into a fetal position on your side. If your pain worsens with extension-based activities (prolonged standing, working overhead), avoid resting in an extended position such as sleeping on your stomach. Most people will find more comfort when they rest with their spine in a more neutral position, neither too rounded nor too arched. Lying on a non-sagging bed on your side with a pillow between your knees, or on your back with a pillow under your knees will often provide some relief. Keep in mind, being in any one position for a prolonged period of time, no matter how correct your posture, can still lead to stiffness.  WALKING Walk with an upright posture. Your ears, shoulders, and hips should all line up. OFFICE WORK When working at a desk, create an environment that supports good, upright posture. Without extra support, muscles fatigue and lead to excessive strain on joints and other tissues.  CHAIR:  A chair should be able to slide under your desk when your back makes contact with the back of the chair. This allows you to work closely.  The chair's height should allow your eyes to be level with the upper part of your monitor and your hands to be slightly lower than your elbows.  Body position: ? Your feet should make contact with the floor. If this is not possible, use a foot rest. ? Keep your ears over your shoulders. This will reduce stress on your neck and low back.

## 2017-07-27 NOTE — Progress Notes (Signed)
Chief Complaint  Patient presents with  . Establish Care       New Patient Visit SUBJECTIVE: HPI: Cynthia Montgomery is an 67 y.o.female who is being seen for establishing care.  The patient was previously seen at [facility].  Has been having headaches off and on for approx 2 years. She will get around 10/month. It lasts around 4 hrs when she wakes up. She does snore and report of nighttime awakenings. She has never had a sleep study. Pillow is OK, denies consistent neck pain. No neurologic s/s's.   No Known Allergies  Past Medical History:  Diagnosis Date  . Arthritis   . Cataract   . Colon polyps   . High cholesterol   . Hypertension   . Positive TB test    Past Surgical History:  Procedure Laterality Date  . KNEE SURGERY    . TUBAL LIGATION     Family History  Problem Relation Age of Onset  . Arthritis Mother   . Hypertension Mother   . Cancer Father   . Diabetes Sister   . Hypertension Sister   . Arthritis Daughter   . Asthma Daughter   . Kidney disease Daughter    No Known Allergies  Current Outpatient Medications:  .  amLODipine (NORVASC) 5 MG tablet, Take 5 mg by mouth daily., Disp: , Rfl:  .  aspirin EC 81 MG tablet, Take 81 mg by mouth daily., Disp: , Rfl:  .  baclofen (LIORESAL) 10 MG tablet, Take 10 mg by mouth 2 (two) times daily., Disp: , Rfl:  .  losartan (COZAAR) 25 MG tablet, Take 25 mg by mouth daily., Disp: , Rfl:  .  lovastatin (MEVACOR) 40 MG tablet, Take 40 mg by mouth at bedtime., Disp: , Rfl:  .  Multiple Vitamin (MULTIVITAMIN) tablet, Take 1 tablet by mouth daily., Disp: , Rfl:    ROS Cardio: Denies chest pain  Neuro:  As noted in HPI   OBJECTIVE: BP 128/86 (BP Location: Left Arm, Patient Position: Sitting, Cuff Size: Large)   Pulse 70   Temp 98.1 F (36.7 C) (Oral)   Ht  (1.575 m)   Wt 192 lb 6 oz (87.3 kg)   SpO2 99%   BMI 35.19 kg/m   Constitutional: -  VS reviewed -  Well developed, well nourished, appears stated age -  No  apparent distress  Psychiatric: -  Oriented to person, place, and time -  Memory intact -  Affect and mood normal -  Fluent conversation, good eye contact -  Judgment and insight age appropriate  Eye: -  Conjunctivae clear, no discharge -  Pupils symmetric, round, reactive to light  ENMT: -  MMM    Pharynx moist, no exudate, no erythema  Neck: -  No gross swelling, no palpable masses -  Thyroid midline, not enlarged, mobile, no palpable masses  Cardiovascular: -  RRR -  No LE edema  Respiratory: -  Normal respiratory effort, no accessory muscle use, no retraction -  Breath sounds equal, no wheezes, no ronchi, no crackles  Neurological:  -  CN II - XII grossly intact -  No cerebellar signs -DTRs equal and symmetric throughout  Musculoskeletal: -  No clubbing, no cyanosis -  5/5 strength throughout -  Gait normal   ASSESSMENT/PLAN: Nonintractable episodic headache, unspecified headache type  Patient instructed to sign release of records form from her previous PCP. I believe her morning headaches are secondary to obstructive sleep apnea.  She currently  does not wish to have a sleep study done.  If she changes her mind, she will let us know.  In the meantime, counseled on diet and exercise and elevate head of bed.  Ibuprofen/Tylenol appropriate for use for headaches. Patient should return in 3 months for a physical. The patient voiced understanding and agreement to the plan.   Jilda Roche Leland, DO 07/27/17  12:06 PM

## 2017-08-04 ENCOUNTER — Other Ambulatory Visit: Payer: Self-pay | Admitting: Family Medicine

## 2017-08-04 MED ORDER — LOVASTATIN 40 MG PO TABS: 40.0000 mg | ORAL_TABLET | Freq: Every day | ORAL | 5 refills | Status: DC

## 2017-08-04 MED ORDER — AMLODIPINE BESYLATE 5 MG PO TABS: 5.0000 mg | ORAL_TABLET | Freq: Every day | ORAL | 5 refills | Status: DC

## 2017-08-04 MED ORDER — LOSARTAN POTASSIUM 25 MG PO TABS: 25.0000 mg | ORAL_TABLET | Freq: Every day | ORAL | 5 refills | Status: DC

## 2017-08-04 NOTE — Telephone Encounter (Signed)
Copied from CRM #[id number]. Topic: Inquiry °>> Aug 04, 2017 10:13 AM Robinson, Andra M wrote: °Reason for CRM: Patient called requesting the following three medication refills: AmLODipine (NORVASC) 5 MG tablet, Losartan (COZAAR) 25 MG tablet and Lovastatin (MEVACOR) 40 MG tablet. Patient's preferred pharmacy is [id number] - [place], °[place] - [address], [address] [phone] (Phone)  [phone] (Fax).       Thank You!!! ° ° ° °  ° ° ° ° ° ° °

## 2017-08-04 NOTE — Telephone Encounter (Signed)
Medication filled on 08/04/17 

## 2017-08-04 NOTE — Telephone Encounter (Signed)
Copied from CRM [id number]. Topic: Inquiry >> Aug 04, 2017 10:13 AM Yvonna Alanis wrote: Reason for CRM: Patient called requesting the following three medication refills: AmLODipine (NORVASC) 5 MG tablet, Losartan (COZAAR) 25 MG tablet and Lovastatin (MEVACOR) 40 MG tablet. Patient's preferred pharmacy is [facility] [address], [place] - [address], [address] [phone] (Phone)  [phone] (Fax).       Thank You!!!

## 2017-09-14 ENCOUNTER — Emergency Department (HOSPITAL_BASED_OUTPATIENT_CLINIC_OR_DEPARTMENT_OTHER)
Admission: EM | Admit: 2017-09-14 | Discharge: 2017-09-14 | Disposition: A | Discharge status: Home / Self Care | Payer: Medicare HMO | Attending: Emergency Medicine | Admitting: Emergency Medicine

## 2017-09-14 ENCOUNTER — Encounter (HOSPITAL_BASED_OUTPATIENT_CLINIC_OR_DEPARTMENT_OTHER): Payer: Self-pay

## 2017-09-14 DIAGNOSIS — Z79899 Other long term (current) drug therapy: Secondary | ICD-10-CM | POA: Diagnosis not present

## 2017-09-14 DIAGNOSIS — I10 Essential (primary) hypertension: Secondary | ICD-10-CM | POA: Insufficient documentation

## 2017-09-14 DIAGNOSIS — J029 Acute pharyngitis, unspecified: Secondary | ICD-10-CM | POA: Diagnosis not present

## 2017-09-14 DIAGNOSIS — R51 Headache: Secondary | ICD-10-CM | POA: Diagnosis not present

## 2017-09-14 DIAGNOSIS — R519 Headache, unspecified: Secondary | ICD-10-CM

## 2017-09-14 LAB — RAPID STREP SCREEN (MED CTR MEBANE ONLY): Streptococcus, Group A Screen (Direct): NEGATIVE

## 2017-09-14 MED ORDER — ACETAMINOPHEN 500 MG PO TABS
1000.0000 mg | ORAL_TABLET | Freq: Once | ORAL | Status: AC
Start: 2017-09-14 — End: 2017-09-14
  Administered 2017-09-14: 1000 mg via ORAL
  Filled 2017-09-14: qty 2

## 2017-09-14 NOTE — ED Provider Notes (Signed)
[facility] EMERGENCY DEPARTMENT Provider Note   CSN: [id number] Arrival date & time: 09/14/17  [id number]     History   Chief Complaint Chief Complaint  Patient presents with  . Sore Throat    HPI Cynthia Montgomery is a 67 y.o. female.  Patient c/o sore throat in past 1-2 days. Bilateral, dull, moderate, worse w swallowing. Is able to swallow without difficulty. No trouble breathing. No cough, no nasal congestion/sinus pain. Mild dull, frontal headache, gradual onset. No neck pain/stiffness. No known ill contacts. No cp or sob.   The history is provided by the patient.  Sore Throat  Associated symptoms include headaches. Pertinent negatives include no chest pain and no shortness of breath.    Past Medical History:  Diagnosis Date  . Arthritis   . Cataract   . Colon polyps   . High cholesterol   . Hypertension   . Positive TB test     Patient Active Problem List   Diagnosis Date Noted  . Nonintractable episodic headache 07/27/2017  . HYPERLIPIDEMIA 04/06/2007  . ESSENTIAL HYPERTENSION 04/06/2007  . KNEE PAIN, LEFT 04/06/2007    Past Surgical History:  Procedure Laterality Date  . CATARACT EXTRACTION, BILATERAL  2018  . KNEE SURGERY    . TUBAL LIGATION       OB History   None      Home Medications    Prior to Admission medications   Medication Sig Start Date End Date Taking? Authorizing Provider  amLODipine (NORVASC) 5 MG tablet Take 1 tablet (5 mg total) by mouth daily. 08/04/17   Sharlene DoryWendling, Nicholas Paul, DO  aspirin EC 81 MG tablet Take 81 mg by mouth daily.    [provider]  baclofen (LIORESAL) 10 MG tablet Take 10 mg by mouth 2 (two) times daily.    [provider]  losartan (COZAAR) 25 MG tablet Take 1 tablet (25 mg total) by mouth daily. 08/04/17   Sharlene DoryWendling, Nicholas Paul, DO  lovastatin (MEVACOR) 40 MG tablet Take 1 tablet (40 mg total) by mouth at bedtime. 08/04/17   Sharlene DoryWendling, Nicholas Paul, DO  Multiple Vitamin (MULTIVITAMIN)  tablet Take 1 tablet by mouth daily.    [provider]    Family History Family History  Problem Relation Age of Onset  . Arthritis Mother   . Hypertension Mother   . Cancer Father   . Diabetes Sister   . Hypertension Sister   . Arthritis Daughter   . Asthma Daughter   . Kidney disease Daughter     Social History Social History   Tobacco Use  . Smoking status: Never Smoker  . Smokeless tobacco: Never Used  Substance Use Topics  . Alcohol use: No  . Drug use: No     Allergies   Patient has no known allergies.   Review of Systems Review of Systems  Constitutional: Negative for fatigue.  HENT: Positive for sore throat.   Respiratory: Negative for cough and shortness of breath.   Cardiovascular: Negative for chest pain and leg swelling.  Gastrointestinal: Negative for nausea and vomiting.  Musculoskeletal: Negative for neck pain and neck stiffness.  Skin: Negative for rash.  Neurological: Positive for headaches.     Physical Exam Updated Vital Signs BP (!) 143/73 (BP Location: Right Arm)   Pulse 85   Temp 98.5 F (36.9 C) (Oral)   Resp 18   SpO2 96%   Physical Exam  Constitutional: She appears well-developed and well-nourished.  HENT:  Pharynx with  mild erythema. No asymmetric swelling or abscess. No sinus or temporal tenderness. tms normal.   Eyes: Pupils are equal, round, and reactive to light. Conjunctivae are normal. No scleral icterus.  Neck: Neck supple. No tracheal deviation present. No thyromegaly present.  No stiffness or rigidity. No meningeal signs.   Cardiovascular: Normal rate, regular rhythm, normal heart sounds and intact distal pulses. Exam reveals no gallop and no friction rub.  No murmur heard. Pulmonary/Chest: Effort normal and breath sounds normal. No respiratory distress.  Abdominal: Normal appearance. She exhibits no distension. There is no tenderness.  No hsm.   Genitourinary:  Genitourinary Comments: No cva tenderness    Musculoskeletal: She exhibits no edema.  Neurological: She is alert.  Speech normal/fluent. Ambulates w steady gait.   Skin: Skin is warm and dry. No rash noted.  Psychiatric: She has a normal mood and affect.  Nursing note and vitals reviewed.    ED Treatments / Results  Labs (all labs ordered are listed, but only abnormal results are displayed) Results for orders placed or performed during the hospital encounter of 09/14/17  Rapid Strep Screen ([facility] ONLY)  Result Value Ref Range   Streptococcus, Group A Screen (Direct) NEGATIVE NEGATIVE    EKG None  Radiology No results found.  Procedures Procedures (including critical care time)  Medications Ordered in ED Medications  acetaminophen (TYLENOL) tablet 1,000 mg (has no administration in time range)     Initial Impression / Assessment and Plan / ED Course  I have reviewed the triage vital signs and the nursing notes.  Pertinent labs & imaging results that were available during my care of the patient were reviewed by me and considered in my medical decision making (see chart for details).  Pt has not taken any meds today. Acetaminophen po. Po fluids.  Strep screen sent.  Reviewed nursing notes and prior charts for additional history.   Labs reviewed - strep neg.   Discussed results w pt.  Symptoms/exam felt most c/w viral pharyngitis.   Return precautions provided.     Final Clinical Impressions(s) / ED Diagnoses   Final diagnoses:  None    ED Discharge Orders    None       Cathren Laine, MD 09/14/17 [phone]

## 2017-09-14 NOTE — ED Triage Notes (Signed)
Pt c/o sore throat, neck pain and headache since Saturday

## 2017-09-14 NOTE — Discharge Instructions (Addendum)
It was our pleasure to provide your ER care today - we hope that you feel better.  Your strep test is good/normal.   Drink plenty of fluids. Use throat lozenges as need for symptom relief.  Take acetaminophen and/or ibuprofen as need for pain.   Follow up with primary care doctor in 1 week if symptoms fail to improve/resolve.  Return to ER if worse, new symptoms, new or severe pain, unable to swallow, vomiting, other concern.

## 2017-09-17 LAB — CULTURE, GROUP A STREP (THRC)

## 2017-09-25 ENCOUNTER — Ambulatory Visit (INDEPENDENT_AMBULATORY_CARE_PROVIDER_SITE_OTHER): Payer: Medicare HMO | Admitting: Medical

## 2017-09-25 ENCOUNTER — Inpatient Hospital Stay (HOSPITAL_BASED_OUTPATIENT_CLINIC_OR_DEPARTMENT_OTHER)
Admission: EM | Admit: 2017-09-25 | Discharge: 2017-09-28 | DRG: 153 | Disposition: A | Discharge status: Home / Self Care | Payer: Medicare HMO | Attending: Internal Medicine | Admitting: Internal Medicine

## 2017-09-25 ENCOUNTER — Emergency Department (HOSPITAL_BASED_OUTPATIENT_CLINIC_OR_DEPARTMENT_OTHER): Payer: Medicare HMO

## 2017-09-25 ENCOUNTER — Encounter: Payer: Self-pay | Admitting: Medical

## 2017-09-25 ENCOUNTER — Ambulatory Visit (HOSPITAL_BASED_OUTPATIENT_CLINIC_OR_DEPARTMENT_OTHER)
Admission: RE | Admit: 2017-09-25 | Discharge: 2017-09-25 | Disposition: A | Discharge status: Home / Self Care | Payer: Medicare HMO | Source: Ambulatory Visit | Attending: Medical | Admitting: Medical

## 2017-09-25 ENCOUNTER — Encounter (HOSPITAL_BASED_OUTPATIENT_CLINIC_OR_DEPARTMENT_OTHER): Payer: Self-pay | Admitting: *Deleted

## 2017-09-25 ENCOUNTER — Other Ambulatory Visit: Payer: Self-pay

## 2017-09-25 VITALS — BP 143/69 | HR 88 | Temp 98.2°F | Resp 16 | Ht 62.0 in | Wt 190.8 lb

## 2017-09-25 DIAGNOSIS — Z9851 Tubal ligation status: Secondary | ICD-10-CM | POA: Diagnosis not present

## 2017-09-25 DIAGNOSIS — I10 Essential (primary) hypertension: Secondary | ICD-10-CM | POA: Diagnosis present

## 2017-09-25 DIAGNOSIS — R591 Generalized enlarged lymph nodes: Secondary | ICD-10-CM | POA: Diagnosis not present

## 2017-09-25 DIAGNOSIS — Z8249 Family history of ischemic heart disease and other diseases of the circulatory system: Secondary | ICD-10-CM

## 2017-09-25 DIAGNOSIS — N179 Acute kidney failure, unspecified: Secondary | ICD-10-CM | POA: Diagnosis present

## 2017-09-25 DIAGNOSIS — R5383 Other fatigue: Secondary | ICD-10-CM | POA: Diagnosis not present

## 2017-09-25 DIAGNOSIS — G8929 Other chronic pain: Secondary | ICD-10-CM | POA: Diagnosis present

## 2017-09-25 DIAGNOSIS — J020 Streptococcal pharyngitis: Principal | ICD-10-CM | POA: Diagnosis present

## 2017-09-25 DIAGNOSIS — E042 Nontoxic multinodular goiter: Secondary | ICD-10-CM | POA: Diagnosis present

## 2017-09-25 DIAGNOSIS — Z7982 Long term (current) use of aspirin: Secondary | ICD-10-CM

## 2017-09-25 DIAGNOSIS — Z9841 Cataract extraction status, right eye: Secondary | ICD-10-CM | POA: Diagnosis not present

## 2017-09-25 DIAGNOSIS — E785 Hyperlipidemia, unspecified: Secondary | ICD-10-CM | POA: Diagnosis present

## 2017-09-25 DIAGNOSIS — E041 Nontoxic single thyroid nodule: Secondary | ICD-10-CM

## 2017-09-25 DIAGNOSIS — K122 Cellulitis and abscess of mouth: Secondary | ICD-10-CM

## 2017-09-25 DIAGNOSIS — Z9842 Cataract extraction status, left eye: Secondary | ICD-10-CM

## 2017-09-25 DIAGNOSIS — J028 Acute pharyngitis due to other specified organisms: Secondary | ICD-10-CM | POA: Diagnosis not present

## 2017-09-25 DIAGNOSIS — B2790 Infectious mononucleosis, unspecified without complication: Secondary | ICD-10-CM | POA: Diagnosis present

## 2017-09-25 DIAGNOSIS — J029 Acute pharyngitis, unspecified: Secondary | ICD-10-CM | POA: Diagnosis present

## 2017-09-25 DIAGNOSIS — E7800 Pure hypercholesterolemia, unspecified: Secondary | ICD-10-CM

## 2017-09-25 DIAGNOSIS — R221 Localized swelling, mass and lump, neck: Secondary | ICD-10-CM | POA: Diagnosis not present

## 2017-09-25 DIAGNOSIS — B2799 Infectious mononucleosis, unspecified with other complication: Secondary | ICD-10-CM

## 2017-09-25 HISTORY — DX: Cellulitis and abscess of mouth: K12.2

## 2017-09-25 LAB — CBC WITH DIFFERENTIAL/PLATELET
Basophils Absolute: 0 10*3/uL (ref 0.0–0.1)
Basophils Relative: 0 %
EOS PCT: 0 %
Eosinophils Absolute: 0 10*3/uL (ref 0.0–0.7)
HCT: 37.3 % (ref 36.0–46.0)
Hemoglobin: 12.9 g/dL (ref 12.0–15.0)
LYMPHS ABS: 1.1 10*3/uL (ref 0.7–4.0)
LYMPHS PCT: 5 %
MCH: 30.2 pg (ref 26.0–34.0)
MCHC: 34.6 g/dL (ref 30.0–36.0)
MCV: 87.4 fL (ref 78.0–100.0)
MONO ABS: 1.4 10*3/uL — AB (ref 0.1–1.0)
Monocytes Relative: 6 %
Neutro Abs: 22.3 10*3/uL — ABNORMAL HIGH (ref 1.7–7.7)
Neutrophils Relative %: 89 %
PLATELETS: 262 10*3/uL (ref 150–400)
RBC: 4.27 MIL/uL (ref 3.87–5.11)
RDW: 14.4 % (ref 11.5–15.5)
WBC: 24.9 10*3/uL — ABNORMAL HIGH (ref 4.0–10.5)

## 2017-09-25 LAB — COMPREHENSIVE METABOLIC PANEL
ALT: 134 U/L — AB (ref 0–44)
ANION GAP: 10 (ref 5–15)
AST: 180 U/L — ABNORMAL HIGH (ref 15–41)
Albumin: 3.5 g/dL (ref 3.5–5.0)
Alkaline Phosphatase: 86 U/L (ref 38–126)
BUN: 14 mg/dL (ref 8–23)
CHLORIDE: 103 mmol/L (ref 98–111)
CO2: 24 mmol/L (ref 22–32)
CREATININE: 1.16 mg/dL — AB (ref 0.44–1.00)
Calcium: 8.6 mg/dL — ABNORMAL LOW (ref 8.9–10.3)
GFR, EST AFRICAN AMERICAN: 55 mL/min — AB (ref >60)
GFR, EST NON AFRICAN AMERICAN: 48 mL/min — AB (ref >60)
Glucose, Bld: 119 mg/dL — ABNORMAL HIGH (ref 70–99)
POTASSIUM: 4.2 mmol/L (ref 3.5–5.1)
Sodium: 137 mmol/L (ref 135–145)
Total Bilirubin: 1 mg/dL (ref 0.3–1.2)
Total Protein: 7.8 g/dL (ref 6.5–8.1)

## 2017-09-25 LAB — TSH: TSH: 0.58 u[IU]/mL (ref 0.35–4.50)

## 2017-09-25 LAB — T4, FREE: Free T4: 0.95 ng/dL (ref 0.60–1.60)

## 2017-09-25 LAB — I-STAT CG4 LACTIC ACID, ED: Lactic Acid, Venous: 0.69 mmol/L (ref 0.5–1.9)

## 2017-09-25 MED ORDER — SODIUM CHLORIDE 0.9 % IV SOLN
Freq: Once | INTRAVENOUS | Status: AC
  Administered 2017-09-25: 22:00:00 via INTRAVENOUS

## 2017-09-25 MED ORDER — KETOROLAC TROMETHAMINE 30 MG/ML IJ SOLN
30.0000 mg | Freq: Once | INTRAMUSCULAR | Status: AC
  Administered 2017-09-25: 30 mg via INTRAMUSCULAR

## 2017-09-25 MED ORDER — SODIUM CHLORIDE 0.9 % IJ SOLN
5.00 | INTRAMUSCULAR | Status: DC
Start: ? — End: 2017-09-25

## 2017-09-25 MED ORDER — MELOXICAM 7.5 MG PO TABS: 7.5000 mg | ORAL_TABLET | Freq: Every day | ORAL | 0 refills | Status: DC

## 2017-09-25 MED ORDER — SODIUM CHLORIDE 0.9 % IV BOLUS
1000.0000 mL | Freq: Once | INTRAVENOUS | Status: AC
  Administered 2017-09-25: 1000 mL via INTRAVENOUS

## 2017-09-25 MED ORDER — GENERIC EXTERNAL MEDICATION
100.00 | Status: DC
Start: ? — End: 2017-09-25

## 2017-09-25 MED ORDER — ONDANSETRON HCL 4 MG/2ML IJ SOLN
4.0000 mg | Freq: Once | INTRAMUSCULAR | Status: AC
  Administered 2017-09-25: 4 mg via INTRAVENOUS
  Filled 2017-09-25: qty 2

## 2017-09-25 MED ORDER — CLINDAMYCIN PHOSPHATE 600 MG/50ML IV SOLN
600.0000 mg | Freq: Once | INTRAVENOUS | Status: AC
  Administered 2017-09-25: 600 mg via INTRAVENOUS
  Filled 2017-09-25: qty 50

## 2017-09-25 MED ORDER — HYDROCODONE-ACETAMINOPHEN 7.5-325 MG/15ML PO SOLN
10.0000 mL | Freq: Once | ORAL | Status: AC
  Administered 2017-09-25: 10 mL via ORAL
  Filled 2017-09-25: qty 15

## 2017-09-25 NOTE — ED Provider Notes (Signed)
[facility] EMERGENCY DEPARTMENT Provider Note   CSN: [id number] Arrival date & time: 09/25/17  1914     History   Chief Complaint Chief Complaint  Patient presents with  . Sore Throat    HPI Cynthia Montgomery is a 67 y.o. female.  HPI  Patient is a 67 year old female with a history of hypertension, hypercholesterolemia, and arthritis presenting for sore throat and right-sided neck pain.  Patient reports that her symptoms have occurred for the past week, however going back a couple weeks she had a sore throat this diagnosis a viral infection.  Patient reports yesterday she presented to [facility] and was diagnosed with mononucleosis, given Decadron, and follow-up with PCP today.  Patient was seen by PCP today who ordered thyroid ultrasound.  Patient denies having recorded fever, but does report chills.  She does report that she has had significant pain with swallowing and she feels that is becoming more difficult to breathe particular when she lies down.  Patient reports nonproductive cough.  Patient denies any abdominal pain, nausea, or vomiting.  Past Medical History:  Diagnosis Date  . Arthritis   . Cataract   . Colon polyps   . High cholesterol   . Hypertension   . Positive TB test     Patient Active Problem List   Diagnosis Date Noted  . Nonintractable episodic headache 07/27/2017  . HYPERLIPIDEMIA 04/06/2007  . ESSENTIAL HYPERTENSION 04/06/2007  . KNEE PAIN, LEFT 04/06/2007    Past Surgical History:  Procedure Laterality Date  . CATARACT EXTRACTION, BILATERAL  2018  . KNEE SURGERY    . TUBAL LIGATION       OB History   None      Home Medications    Prior to Admission medications   Medication Sig Start Date End Date Taking? Authorizing Provider  amLODipine (NORVASC) 5 MG tablet Take 1 tablet (5 mg total) by mouth daily. 08/04/17   Sharlene Dory, DO  aspirin EC 81 MG tablet Take 81 mg by mouth daily.    [provider]  baclofen (LIORESAL) 10 MG tablet Take 10 mg by mouth 2 (two) times daily.    [provider]  losartan (COZAAR) 25 MG tablet Take 1 tablet (25 mg total) by mouth daily. 08/04/17   Sharlene Dory, DO  lovastatin (MEVACOR) 40 MG tablet Take 1 tablet (40 mg total) by mouth at bedtime. 08/04/17   Sharlene Dory, DO  meloxicam (MOBIC) 7.5 MG tablet Take 1 tablet (7.5 mg total) by mouth daily. 09/25/17   Saguier, Ramon Dredge, PA-C  Multiple Vitamin (MULTIVITAMIN) tablet Take 1 tablet by mouth daily.    [provider]    Family History Family History  Problem Relation Age of Onset  . Arthritis Mother   . Hypertension Mother   . Cancer Father   . Diabetes Sister   . Hypertension Sister   . Arthritis Daughter   . Asthma Daughter   . Kidney disease Daughter     Social History Social History   Tobacco Use  . Smoking status: Never Smoker  . Smokeless tobacco: Never Used  Substance Use Topics  . Alcohol use: No  . Drug use: No     Allergies   Patient has no known allergies.   Review of Systems Review of Systems  Constitutional: Positive for chills. Negative for fever.  HENT: Positive for sore throat, trouble swallowing and voice change. Negative for congestion, dental problem, sinus pressure and  sinus pain.   Respiratory: Positive for cough and shortness of breath. Negative for stridor.   Gastrointestinal: Negative for abdominal pain, nausea and vomiting.  All other systems reviewed and are negative.    Physical Exam Updated Vital Signs BP (!) 168/77   Pulse 95   Temp 98.9 F (37.2 C)   Resp 16   Ht 5\' 2"  (1.575 m)   Wt 86.2 kg (190 lb)   SpO2 100%   BMI 34.75 kg/m   Physical Exam  Constitutional: She appears well-developed and well-nourished. No distress.  HENT:  Head: Normocephalic and atraumatic.  Mouth/Throat: Tonsils are 3+ on the right. Tonsils are 2+ on the left.  Normal phonation. Mild muffled quality to voice.  Patient swallows secretions without difficulty. Dentition normal. No lesions of tongue or buccal mucosa. Uvula midline.  Right tonsillar region is significantly more swollen left tonsillar region, however the anterior tonsillar pillar is visible and nomedematous.  Erythema of posterior pharynx. No tonsillar exuduate. No lingual swelling. No induration inferior to tongue. Right sided submandibular TTP.  Tissues of the neck indurated on right side. + anterior and posterior cervical lymphadenopathy. Right TM without erythema or effusion; left TM without erythema or effusion.   Eyes: Pupils are equal, round, and reactive to light. Conjunctivae and EOM are normal.  Neck: Normal range of motion. Neck supple.  Cardiovascular: Normal rate, regular rhythm, S1 normal and S2 normal.  No murmur heard. Pulmonary/Chest: Effort normal and breath sounds normal. She has no wheezes. She has no rales.  Abdominal: Soft. She exhibits no distension. There is no tenderness. There is no guarding.  Musculoskeletal: Normal range of motion. She exhibits no edema or deformity.  Lymphadenopathy:    She has no cervical adenopathy.  Neurological: She is alert.  Cranial nerves grossly intact. Patient moves extremities symmetrically and with good coordination.  Skin: Skin is warm and dry. No rash noted. No erythema.  Psychiatric: She has a normal mood and affect. Her behavior is normal. Judgment and thought content normal.  Nursing note and vitals reviewed.    ED Treatments / Results  Labs (all labs ordered are listed, but only abnormal results are displayed) Labs Reviewed  CBC WITH DIFFERENTIAL/PLATELET  COMPREHENSIVE METABOLIC PANEL    EKG None  Radiology No results found.  Procedures Procedures (including critical care time)  Medications Ordered in ED Medications  sodium chloride 0.9 % bolus 1,000 mL (has no administration in time range)  clindamycin (CLEOCIN) IVPB 600 mg (has no administration in time  range)  HYDROcodone-acetaminophen (HYCET) 7.5-325 mg/15 ml solution 10 mL (has no administration in time range)     Initial Impression / Assessment and Plan / ED Course  I have reviewed the triage vital signs and the nursing notes.  Pertinent labs & imaging results that were available during my care of the patient were reviewed by me and considered in my medical decision making (see chart for details).  Clinical Course as of Jul 06 0001  Fri Sep 25, 2017  2106 ALT(!): 134 [AM]  2106 Elevated transaminases likely 2/2 mononucleosis.  AST(!): 180 [AM]  2106 Leukocytosis likely 2/2 infectious process. Unlikely to be directly related to Decadron.  WBC(!): 24.9 [AM]  2222 Spoke with Dr. Annalee Genta of ENT, who recommends continuing clindamycin and admitting of clinical picture suggests that she needs IV antibiotics.  Appreciate Dr. Thurmon Fair input in the care of this patient.   [AM]  2247 Spoke with Dr. Mikeal Hawthorne of [facility] hospitalist who will admit the  patient to medicine.  Appreciate [facility] hospitalist involvement in the care of this patient.   [AM]    Clinical Course User Index [AM] Elisha PonderMurray, Amaru Burroughs B, PA-C    Patient uncomfortable appearing, but maintaining intact airway.  Afebrile at this time.  No signs of SIRS or sepsis at this time.  Patient does have concerning examination findings for   Deep space infection of the head and neck.   CT scan reviewed from yesterday, and demonstrates severe swelling of the retropharyngeal region.   Work-up demonstrates significant leukocytosis of 24.9.  This may be related partially due to steroids, however do not feel that the leukocytosis is entirely representative steroids.  Additionally patient exhibits a transaminitis, consistent with her diagnosis of mononucleosis.  Per ENT consult, patient to be admitted medically and continue clindamycin.  No abscess requiring daily drainage at this time.    This is a shared visit with Dr. Arby BarretteMarcy Pfeiffer. Patient was  independently evaluated by this attending physician. Attending physician consulted in evaluation and admission management.  Final Clinical Impressions(s) / ED Diagnoses   Final diagnoses:  Neck swelling  Infectious mononucleosis, with other complication, infectious mononucleosis due to unspecified organism    ED Discharge Orders    None       Delia ChimesMurray, Elza Sortor B, PA-C 09/26/17 0002    Arby BarrettePfeiffer, Marcy, MD 09/29/17 1217

## 2017-09-25 NOTE — ED Notes (Signed)
Family at bedside. 

## 2017-09-25 NOTE — ED Notes (Signed)
Report given to [facility]. 

## 2017-09-25 NOTE — ED Notes (Signed)
Pt given water for PO challenge 

## 2017-09-25 NOTE — Addendum Note (Signed)
Addended by: Orlene OchRENCE, Lesha Jager N on: 09/25/2017 01:28 PM   Modules accepted: Orders

## 2017-09-25 NOTE — ED Triage Notes (Signed)
Pt c/o sore throat , [facility] DX mono and PMD today with US done awaiting results.

## 2017-09-25 NOTE — Progress Notes (Signed)
Subjective:    Patient ID: Cynthia Montgomery, female    DOB: 04-04-1950, 67 y.o.   MRN: [id number]  HPI  Pt in states her throat has been sore for about 2 weeks. She told viral pharyngitis on September 16, 2017. Work up in ED did not show strep. She was told viral pharyngitis.   Pt states she took antihistamine and her symptoms felt better briefly but then yesterday pain level increase dand she went back to ED. The did a CT scan. She states told had mono. She did have positive. She reports some mild fatigue. Nothing severe.   Her cbc did not show increased wbc. Her metabolic pane looked good except sugar was 100. Only mild elevation.  CT scan showed.  . Right-sided acute pharyngitis.. Severe diffuse inflammation in the surrounding right-sided deep cervical compartments. No discrete rim enhancing abscess identified. 2. Right cervical adenopathy, likely reactive. No lymph node necrosis or suppuration. 3. 18 mm nodule in the left lobe of the thyroid gland. Thyroid ultrasound is recommended on a nonemergent basis. 4. Cervical spondylosis with multilevel disc protrusions. Prominent C3-4 disc protrusion likely impinges the cord with moderate canal Stenosis.    Review of Systems  Constitutional: Positive for fatigue. Negative for chills and fever.       Temp yesterday 99. Chills yesterday.  HENT: Positive for sore throat. Negative for congestion, dental problem, postnasal drip, sneezing, tinnitus and voice change.        Pain swallowing.  Respiratory: Negative for apnea, choking, chest tightness and wheezing.   Cardiovascular: Negative for chest pain and palpitations.  Gastrointestinal: Negative for abdominal pain, nausea, rectal pain and vomiting.  Musculoskeletal: Negative for back pain.  Skin: Negative for rash.  Neurological: Negative for dizziness and numbness.  Hematological: Positive for adenopathy.  Psychiatric/Behavioral: Negative for behavioral problems and confusion.    Past  Medical History:  Diagnosis Date  . Arthritis   . Cataract   . Colon polyps   . High cholesterol   . Hypertension   . Positive TB test      Social History   Socioeconomic History  . Marital status: Legally Separated    Spouse name: Not on file  . Number of children: Not on file  . Years of education: Not on file  . Highest education level: Not on file  Occupational History  . Not on file  Social Needs  . Financial resource strain: Not on file  . Food insecurity:    Worry: Not on file    Inability: Not on file  . Transportation needs:    Medical: Not on file    Non-medical: Not on file  Tobacco Use  . Smoking status: Never Smoker  . Smokeless tobacco: Never Used  Substance and Sexual Activity  . Alcohol use: No  . Drug use: No  . Sexual activity: Not on file  Lifestyle  . Physical activity:    Days per week: Not on file    Minutes per session: Not on file  . Stress: Not on file  Relationships  . Social connections:    Talks on phone: Not on file    Gets together: Not on file    Attends religious service: Not on file    Active member of club or organization: Not on file    Attends meetings of clubs or organizations: Not on file    Relationship status: Not on file  . Intimate partner violence:    Fear of current or ex  partner: Not on file    Emotionally abused: Not on file    Physically abused: Not on file    Forced sexual activity: Not on file  Other Topics Concern  . Not on file  Social History Narrative  . Not on file    Past Surgical History:  Procedure Laterality Date  . CATARACT EXTRACTION, BILATERAL  2018  . KNEE SURGERY    . TUBAL LIGATION      Family History  Problem Relation Age of Onset  . Arthritis Mother   . Hypertension Mother   . Cancer Father   . Diabetes Sister   . Hypertension Sister   . Arthritis Daughter   . Asthma Daughter   . Kidney disease Daughter     No Known Allergies  Current Outpatient Medications on File Prior  to Visit  Medication Sig Dispense Refill  . amLODipine (NORVASC) 5 MG tablet Take 1 tablet (5 mg total) by mouth daily. 30 tablet 5  . aspirin EC 81 MG tablet Take 81 mg by mouth daily.    . baclofen (LIORESAL) 10 MG tablet Take 10 mg by mouth 2 (two) times daily.    Marland Kitchen. losartan (COZAAR) 25 MG tablet Take 1 tablet (25 mg total) by mouth daily. 30 tablet 5  . lovastatin (MEVACOR) 40 MG tablet Take 1 tablet (40 mg total) by mouth at bedtime. 30 tablet 5  . Multiple Vitamin (MULTIVITAMIN) tablet Take 1 tablet by mouth daily.     No current facility-administered medications on file prior to visit.     BP (!) 143/69   Pulse 88   Temp 98.2 F (36.8 C) (Oral)   Resp 16   Ht 5\' 2"  (1.575 m)   Wt 190 lb 12.8 oz (86.5 kg)   SpO2 99%   BMI 34.90 kg/m       Objective:   Physical Exam  General  Mental Status - Alert. General Appearance - Well groomed. Not in acute distress.  Skin Rashes- No Rashes.  HEENT Head- Normal. Ear Auditory Canal - Left- Normal. Right - Normal.Tympanic Membrane- Left- Normal. Right- Normal. Eye Sclera/Conjunctiva- Left- Normal. Right- Normal. Nose & Sinuses Nasal Mucosa- Left-  Boggy and Congested. Right-  Boggy and  Congested.Bilateral maxillary and frontal sinus pressure. Mouth & Throat Lips: Upper Lip- Normal: no dryness, cracking, pallor, cyanosis, or vesicular eruption. Lower Lip-Normal: no dryness, cracking, pallor, cyanosis or vesicular eruption. Buccal Mucosa- Bilateral- No Aphthous ulcers. Oropharynx- No Discharge or Erythema. Tonsils: Characteristics- Bilateral- mild  Erythema or Congestion. Size/Enlargement- Bilateral- No enlargement. Discharge- bilateral-None.  Neck Neck- Supple. No Masses. Rt submandibulare node region feels full mild tender.  Thyroid feels mild large. No neck stiffness.   Chest and Lung Exam Auscultation: Breath Sounds:-Clear even and unlabored.  Cardiovascular Auscultation:Rythm- Regular, rate and rhythm. Murmurs &  Other Heart Sounds:Ausculatation of the heart reveal- No Murmurs.  Lymphatic Head & Neck General Head & Neck Lymphatics: Bilateral: Description- see neck exam.  Abdomen-soft, nontender, nondistended. No organomegaly.        Assessment & Plan:  Your mono screen test was positive yesterday.  You have some mild fatigue but typically symptoms should resolve relatively quickly.  On exam did not feel any splenomegaly.  Still would be cautious and try to avoid any trauma to your abdomen.  Your throat was moderately red today and we did a rapid strep test which was negative.  Send out culture being done as well in the event that you have strep.  That can occur rarely with mono.  Will make sure that is not your case presently.   For your thyroid nodule, I did place thyroid ultrasound ordered to be done today at 6:30.  You need to register for the study at the emergency department.  However please explain to them that you saw me earlier today and the study is already ordered.  You are simply there just to get registered for the ultrasound.  Please get TSH and T4 study today.  You do have some moderate to severe pain on swallowing today.  We gave you Toradol 30 mg IM injection.  Starting tomorrow you can use meloxicam 7.5 mg daily.  But not to use any other over-the-counter NSAIDs.  Follow-up in 7 days or as needed.  Esperanza Richters, PA-C

## 2017-09-25 NOTE — Patient Instructions (Addendum)
Your mono screen test was positive yesterday.  You have some mild fatigue but typically symptoms should resolve relatively quickly.  On exam did not feel any splenomegaly.  Still would be cautious and try to avoid any trauma to your abdomen.  Your throat was moderately red today and we did a rapid strep test which was negative.  Send out culture being done as well in the event that you have strep.  That can occur rarely with mono.  Will make sure that is not your case presently.   For your thyroid nodule, I did place thyroid ultrasound ordered to be done today at 6:30.  You need to register for the study at the emergency department.  However please explain to them that you saw me earlier today and the study is already ordered.  You are simply there just to get registered for the ultrasound.  Please get TSH and T4 study today.  You do have some moderate to severe pain on swallowing today.  We gave you Toradol 30 mg IM injection.  Starting tomorrow you can use meloxicam 7.5 mg daily.  But not to use any other over-the-counter NSAIDs.  Follow-up in 7 days or as needed.

## 2017-09-26 ENCOUNTER — Encounter (HOSPITAL_COMMUNITY): Payer: Self-pay | Admitting: Internal Medicine

## 2017-09-26 DIAGNOSIS — J029 Acute pharyngitis, unspecified: Secondary | ICD-10-CM | POA: Insufficient documentation

## 2017-09-26 DIAGNOSIS — I10 Essential (primary) hypertension: Secondary | ICD-10-CM

## 2017-09-26 HISTORY — DX: Acute pharyngitis, unspecified: J02.9

## 2017-09-26 LAB — COMPREHENSIVE METABOLIC PANEL
ALBUMIN: 3 g/dL — AB (ref 3.5–5.0)
ALK PHOS: 80 U/L (ref 38–126)
ALT: 114 U/L — ABNORMAL HIGH (ref 0–44)
ANION GAP: 8 (ref 5–15)
AST: 88 U/L — ABNORMAL HIGH (ref 15–41)
BUN: 10 mg/dL (ref 8–23)
CHLORIDE: 109 mmol/L (ref 98–111)
CO2: 25 mmol/L (ref 22–32)
Calcium: 8.5 mg/dL — ABNORMAL LOW (ref 8.9–10.3)
Creatinine, Ser: 0.88 mg/dL (ref 0.44–1.00)
GFR calc non Af Amer: >60 mL/min (ref >60)
GLUCOSE: 114 mg/dL — AB (ref 70–99)
POTASSIUM: 3.8 mmol/L (ref 3.5–5.1)
SODIUM: 142 mmol/L (ref 135–145)
Total Bilirubin: 0.9 mg/dL (ref 0.3–1.2)
Total Protein: 7 g/dL (ref 6.5–8.1)

## 2017-09-26 LAB — CBC WITH DIFFERENTIAL/PLATELET
Basophils Absolute: 0 10*3/uL (ref 0.0–0.1)
Basophils Relative: 0 %
EOS PCT: 0 %
Eosinophils Absolute: 0 10*3/uL (ref 0.0–0.7)
HCT: 35.1 % — ABNORMAL LOW (ref 36.0–46.0)
Hemoglobin: 11.6 g/dL — ABNORMAL LOW (ref 12.0–15.0)
Lymphocytes Relative: 4 %
Lymphs Abs: 1.1 10*3/uL (ref 0.7–4.0)
MCH: 29.7 pg (ref 26.0–34.0)
MCHC: 33 g/dL (ref 30.0–36.0)
MCV: 89.8 fL (ref 78.0–100.0)
MONO ABS: 1.4 10*3/uL — AB (ref 0.1–1.0)
MONOS PCT: 5 %
Neutro Abs: 25.6 10*3/uL — ABNORMAL HIGH (ref 1.7–7.7)
Neutrophils Relative %: 91 %
PLATELETS: 286 10*3/uL (ref 150–400)
RBC: 3.91 MIL/uL (ref 3.87–5.11)
RDW: 15.1 % (ref 11.5–15.5)
WBC: 28.1 10*3/uL — AB (ref 4.0–10.5)

## 2017-09-26 LAB — HIV ANTIBODY (ROUTINE TESTING W REFLEX): HIV SCREEN 4TH GENERATION: NONREACTIVE

## 2017-09-26 LAB — TSH: TSH: 0.866 u[IU]/mL (ref 0.350–4.500)

## 2017-09-26 MED ORDER — ONDANSETRON HCL 4 MG PO TABS: 4.0000 mg | ORAL_TABLET | Freq: Four times a day (QID) | ORAL | Status: DC | PRN

## 2017-09-26 MED ORDER — ACETAMINOPHEN 650 MG RE SUPP: 650.0000 mg | Freq: Four times a day (QID) | RECTAL | Status: DC | PRN

## 2017-09-26 MED ORDER — AMLODIPINE BESYLATE 5 MG PO TABS
5.0000 mg | ORAL_TABLET | Freq: Every day | ORAL | Status: DC
  Administered 2017-09-26 – 2017-09-28 (×3): 5 mg via ORAL
  Filled 2017-09-26 (×3): qty 1

## 2017-09-26 MED ORDER — BACLOFEN 10 MG PO TABS
10.0000 mg | ORAL_TABLET | Freq: Two times a day (BID) | ORAL | Status: DC
  Administered 2017-09-26 – 2017-09-28 (×6): 10 mg via ORAL
  Filled 2017-09-26 (×6): qty 1

## 2017-09-26 MED ORDER — SODIUM CHLORIDE 0.9 % IV SOLN
INTRAVENOUS | Status: DC
  Administered 2017-09-26: 03:00:00 via INTRAVENOUS

## 2017-09-26 MED ORDER — HYDROCODONE-ACETAMINOPHEN 7.5-325 MG/15ML PO SOLN
10.0000 mL | Freq: Four times a day (QID) | ORAL | Status: DC | PRN
Start: 2017-09-26 — End: 2017-09-28
  Administered 2017-09-27: 10 mL via ORAL
  Filled 2017-09-26: qty 15

## 2017-09-26 MED ORDER — PRAVASTATIN SODIUM 40 MG PO TABS
40.0000 mg | ORAL_TABLET | Freq: Every day | ORAL | Status: DC
  Administered 2017-09-26 – 2017-09-27 (×2): 40 mg via ORAL
  Filled 2017-09-26 (×2): qty 1

## 2017-09-26 MED ORDER — ONDANSETRON HCL 4 MG/2ML IJ SOLN
4.0000 mg | Freq: Four times a day (QID) | INTRAMUSCULAR | Status: DC | PRN
  Administered 2017-09-26 – 2017-09-27 (×2): 4 mg via INTRAVENOUS
  Filled 2017-09-26 (×3): qty 2

## 2017-09-26 MED ORDER — ACETAMINOPHEN 325 MG PO TABS
650.0000 mg | ORAL_TABLET | Freq: Four times a day (QID) | ORAL | Status: DC | PRN
  Administered 2017-09-26 – 2017-09-28 (×4): 650 mg via ORAL
  Filled 2017-09-26 (×4): qty 2

## 2017-09-26 MED ORDER — CLINDAMYCIN PHOSPHATE 600 MG/50ML IV SOLN
600.0000 mg | Freq: Three times a day (TID) | INTRAVENOUS | Status: DC
  Administered 2017-09-26 – 2017-09-28 (×7): 600 mg via INTRAVENOUS
  Filled 2017-09-26 (×8): qty 50

## 2017-09-26 NOTE — Progress Notes (Signed)
[facility] No changes during transit. Pt complains of neck and head pain, but comfortable and in NAD. Given pt packet upon admission.

## 2017-09-26 NOTE — H&P (Signed)
History and Physical    Cynthia Montgomery:[id number] DOB: 1950-08-06 DOA: 09/25/2017  PCP: Sharlene Dory, DO  Patient coming from: Home.  Chief Complaint: Right-sided neck pain and throat pain.  HPI: Cynthia Montgomery is a 67 y.o. female with history of hypertension, hyperlipidemia presents to the ER with complaints of worsening pain in the right side of the throat.  Patient symptoms started 2 weeks ago which has been acutely worsening over the last 3 to 4 days.  On July 4 patient had gone to the ER at [facility] and had CT soft neck tissue which shows acute pharyngitis with cervical lymphadenopathy.  Patient at the time was given steroid shot and discharged home.  Reviewing patient's notes during that ER visit it is stated that patient strep throat was recently negative.  Patient comes back to the ER with worsening pain and difficult to swallow.  ED Course: In the ER patient had labs drawn and on-call ENT physician Dr. Jeananne Rama was consulted by ER physician at this time Dr. Jeananne Rama advised antibiotics and reconsult ENT if there is further worsening.  Patient was started on clindamycin.  Labs revealed leukocytosis.  On my exam patient is noted in distress but has difficulty opening mouth fully.  Tenderness on the right jugulodigastric area.  No wheezing or stridor.  Review of Systems: As per HPI, rest all negative.   Past Medical History:  Diagnosis Date  . Arthritis   . Cataract   . Colon polyps   . High cholesterol   . Hypertension   . Positive TB test     Past Surgical History:  Procedure Laterality Date  . CATARACT EXTRACTION, BILATERAL  2018  . KNEE SURGERY    . TUBAL LIGATION       reports that she has never smoked. She has never used smokeless tobacco. She reports that she does not drink alcohol or use drugs.  No Known Allergies  Family History  Problem Relation Age of Onset  . Arthritis Mother   . Hypertension Mother   . Cancer Father   . Diabetes  Sister   . Hypertension Sister   . Arthritis Daughter   . Asthma Daughter   . Kidney disease Daughter     Prior to Admission medications   Medication Sig Start Date End Date Taking? Authorizing Provider  amLODipine (NORVASC) 5 MG tablet Take 1 tablet (5 mg total) by mouth daily. 08/04/17  Yes Sharlene Dory, DO  aspirin EC 81 MG tablet Take 81 mg by mouth daily.   Yes [provider]  baclofen (LIORESAL) 10 MG tablet Take 10 mg by mouth 2 (two) times daily.   Yes [provider]  cholecalciferol (VITAMIN D) 400 units TABS tablet Take 400 Units by mouth daily.   Yes [provider]  losartan (COZAAR) 25 MG tablet Take 1 tablet (25 mg total) by mouth daily. 08/04/17  Yes Sharlene Dory, DO  lovastatin (MEVACOR) 40 MG tablet Take 1 tablet (40 mg total) by mouth at bedtime. 08/04/17  Yes Sharlene Dory, DO  Multiple Vitamin (MULTIVITAMIN) tablet Take 1 tablet by mouth daily.   Yes [provider]  meloxicam (MOBIC) 7.5 MG tablet Take 1 tablet (7.5 mg total) by mouth daily. Patient not taking: Reported on 09/26/2017 09/25/17   Esperanza Richters, PA-C    Physical Exam: Vitals:   09/25/17 2114 09/25/17 2301 09/25/17 2315 09/26/17 0042  BP: (!) 153/79 (!) 147/80  (!) 169/85  Pulse: 97  92 93  Resp: 18   19  Temp:    98.4 F (36.9 C)  TempSrc:    Oral  SpO2: 97%  100% 99%  Weight:      Height:          Constitutional: Moderately built and nourished. Vitals:   09/25/17 2114 09/25/17 2301 09/25/17 2315 09/26/17 0042  BP: (!) 153/79 (!) 147/80  (!) 169/85  Pulse: 97  92 93  Resp: 18   19  Temp:    98.4 F (36.9 C)  TempSrc:    Oral  SpO2: 97%  100% 99%  Weight:      Height:       Eyes: Anicteric no pallor. ENMT: Right-sided jugulodigastric area is swollen.  Tender to touch. Neck: Right jugulodigastric area is tender to touch. Respiratory: No rhonchi or crepitations. Cardiovascular: S1-S2 heard no murmurs  appreciated. Abdomen: Soft nontender bowel sounds present. Musculoskeletal: No edema.  No joint effusion. Skin: No rash. Neurologic: Alert awake oriented to time place and person.  Moves all extremities 5 x 5. Psychiatric: Appears normal.  Normal affect.   Labs on Admission: I have personally reviewed following labs and imaging studies  CBC: Recent Labs  Lab 09/25/17 2014  WBC 24.9*  NEUTROABS 22.3*  HGB 12.9  HCT 37.3  MCV 87.4  PLT 262   Basic Metabolic Panel: Recent Labs  Lab 09/25/17 2014  NA 137  K 4.2  CL 103  CO2 24  GLUCOSE 119*  BUN 14  CREATININE 1.16*  CALCIUM 8.6*   GFR: Estimated Creatinine Clearance: 47.9 mL/min (A) (by C-G formula based on SCr of 1.16 mg/dL (H)). Liver Function Tests: Recent Labs  Lab 09/25/17 2014  AST 180*  ALT 134*  ALKPHOS 86  BILITOT 1.0  PROT 7.8  ALBUMIN 3.5   No results for input(s): LIPASE, AMYLASE in the last 168 hours. No results for input(s): AMMONIA in the last 168 hours. Coagulation Profile: No results for input(s): INR, PROTIME in the last 168 hours. Cardiac Enzymes: No results for input(s): CKTOTAL, CKMB, CKMBINDEX, TROPONINI in the last 168 hours. BNP (last 3 results) No results for input(s): PROBNP in the last 8760 hours. HbA1C: No results for input(s): HGBA1C in the last 72 hours. CBG: No results for input(s): GLUCAP in the last 168 hours. Lipid Profile: No results for input(s): CHOL, HDL, LDLCALC, TRIG, CHOLHDL, LDLDIRECT in the last 72 hours. Thyroid Function Tests: Recent Labs    09/25/17 1121  TSH 0.58  FREET4 0.95   Anemia Panel: No results for input(s): VITAMINB12, FOLATE, FERRITIN, TIBC, IRON, RETICCTPCT in the last 72 hours. Urine analysis: No results found for: COLORURINE, APPEARANCEUR, LABSPEC, PHURINE, GLUCOSEU, HGBUR, BILIRUBINUR, KETONESUR, PROTEINUR, UROBILINOGEN, NITRITE, LEUKOCYTESUR Sepsis Labs: @LABRCNTIP (procalcitonin:4,lacticidven:4) )No results found for this or any  previous visit (from the past 240 hour(s)).   Radiological Exams on Admission: Dg Chest 2 View  Result Date: 09/25/2017 CLINICAL DATA:  Sore throat for 10 days with fever EXAM: CHEST - 2 VIEW COMPARISON:  04/17/2011 FINDINGS: The heart size and mediastinal contours are within normal limits. Both lungs are clear. Mild degenerative changes of the spine. IMPRESSION: No active cardiopulmonary disease. Electronically Signed   By: Jasmine Pang M.D.   On: 09/25/2017 20:52     Assessment/Plan Principal Problem:   Acute pharyngitis Active Problems:   Essential hypertension   Ludwig's angina    1. Acute severe pharyngitis -patient has been placed on clindamycin.  Closely monitor clinically.  For now kept  on clear liquid.  Reconsult ENT if patient clinically deteriorates.  2. Hypertension -in addition to amlodipine will keep patient on PRN IV hydralazine hold Cozaar for now. 3. Acute renal failure likely from poor oral intake.  Hold Cozaar.  Gently hydrate. 4. Thyroid nodule seen in the recent CAT scan of the soft neck tissue.  Thyroid ultrasound was done results are pending.  Check TSH. 5. Hyperlipidemia on statins. 6. Chronic pain on baclofen.   DVT prophylaxis: SCDs. Code Status: Full code. Family Communication: Discussed with patient. Disposition Plan: Home. Consults called: ER physician discussed with the ENT surgeon. Admission status: Inpatient.   Eduard ClosArshad N Kelin Borum MD [facility] Pager [phone].  If 7PM-7AM, please contact night-coverage [email or web address] Password [email or web address]  09/26/2017, 2:49 AM

## 2017-09-26 NOTE — Progress Notes (Signed)
  PROGRESS NOTE  Patient admitted earlier this morning. See H&P. Cynthia Montgomery is a 67 y.o. female with history of hypertension, hyperlipidemia presents to the ER with complaints of worsening pain in the right side of the throat.  Patient symptoms started 2 weeks ago which has been acutely worsening over the last 3 to 4 days.  On July 4 [facility] and [facility] CT soft neck tissue which shows acute pharyngitis with cervical lymphadenopathy.  Patient at the time was given steroid shot and discharged home.  Reviewing patient's notes during that ER visit it is stated that patient strep throat was recently negative.  Patient comes back to the ER with worsening pain and difficult to swallow. EDP spoke with ENT who recommended antibiotics and to re-consult if clinical worsening. Patient was admitted for acute severe pharyngitis.   Patient states pain has improved and overall feeling better, but still has dysphagia with drinking fluids. Continue clindamycin, IVF, clear liquid diet.    Noralee StainJennifer Dahlia Nifong, DO [facility] [email or web address] Password [email or web address] 09/26/2017, 9:07 AM

## 2017-09-27 ENCOUNTER — Telehealth: Payer: Self-pay | Admitting: Medical

## 2017-09-27 DIAGNOSIS — E041 Nontoxic single thyroid nodule: Secondary | ICD-10-CM

## 2017-09-27 DIAGNOSIS — E049 Nontoxic goiter, unspecified: Secondary | ICD-10-CM

## 2017-09-27 LAB — BASIC METABOLIC PANEL
Anion gap: 7 (ref 5–15)
BUN: 9 mg/dL (ref 8–23)
CO2: 22 mmol/L (ref 22–32)
Calcium: 9.1 mg/dL (ref 8.9–10.3)
Chloride: 112 mmol/L — ABNORMAL HIGH (ref 98–111)
Creatinine, Ser: 1 mg/dL (ref 0.44–1.00)
GFR calc Af Amer: >60 mL/min (ref >60)
GFR, EST NON AFRICAN AMERICAN: 57 mL/min — AB (ref >60)
GLUCOSE: 87 mg/dL (ref 70–99)
POTASSIUM: 3.8 mmol/L (ref 3.5–5.1)
Sodium: 141 mmol/L (ref 135–145)

## 2017-09-27 LAB — CULTURE, GROUP A STREP
MICRO NUMBER: 90799025
SPECIMEN QUALITY:: ADEQUATE

## 2017-09-27 LAB — CBC
HCT: 35.4 % — ABNORMAL LOW (ref 36.0–46.0)
Hemoglobin: 11.5 g/dL — ABNORMAL LOW (ref 12.0–15.0)
MCH: 29.6 pg (ref 26.0–34.0)
MCHC: 32.5 g/dL (ref 30.0–36.0)
MCV: 91 fL (ref 78.0–100.0)
PLATELETS: 286 10*3/uL (ref 150–400)
RBC: 3.89 MIL/uL (ref 3.87–5.11)
RDW: 15.4 % (ref 11.5–15.5)
WBC: 21.7 10*3/uL — ABNORMAL HIGH (ref 4.0–10.5)

## 2017-09-27 MED ORDER — FAMOTIDINE 20 MG PO TABS
20.0000 mg | ORAL_TABLET | Freq: Two times a day (BID) | ORAL | Status: DC
Start: 2017-09-27 — End: 2017-09-28
  Administered 2017-09-27 – 2017-09-28 (×2): 20 mg via ORAL
  Filled 2017-09-27 (×2): qty 1

## 2017-09-27 MED ORDER — HYDRALAZINE HCL 20 MG/ML IJ SOLN
10.0000 mg | Freq: Four times a day (QID) | INTRAMUSCULAR | Status: DC | PRN
  Administered 2017-09-27: 10 mg via INTRAVENOUS
  Filled 2017-09-27: qty 1

## 2017-09-27 NOTE — Progress Notes (Addendum)
PROGRESS NOTE    Chasta Deshpande  ZOX:[id number] DOB: [birth year] DOA: 09/25/2017 PCP: Sharlene Dory, DO     Brief Narrative:  Teniola Tseng a 67 y.o.femalewithhistory of hypertension, hyperlipidemia presents to the ER with complaints of worsening pain in the right side of the throat. Patient symptoms started 2 weeks ago which has been acutely worsening over the last 3 to 4 days. On July 4 patient had gone to the ER at [facility] and had CT soft neck tissue which shows acute pharyngitis with cervical lymphadenopathy. Patient at the time was given steroid shot and discharged home. Reviewing patient's notes during that ER visit it is stated that patient strep throat was recently negative. Patient comes back to the ER with worsening pain and difficult to swallow. EDP spoke with ENT who recommended antibiotics and to re-consult if clinical worsening. Patient was admitted for acute severe pharyngitis.   New events last 24 hours / Subjective: Patient is able to tolerate clear liquid diet.  She states that she has some improvement in pain and swallowing.  Had a fever overnight 100.4.  Assessment & Plan:   Principal Problem:   Acute pharyngitis Active Problems:   Essential hypertension   Ludwig's angina  Acute severe pharyngitis Continue IV clindamycin given fever overnight and leukocytosis  Pain control Advance to full liquid diet today  Essential hypertension Continue Norvasc  Hyperlipidemia Continue Pravachol   DVT prophylaxis: SCD Code Status: Full Family Communication: No family at bedside Disposition Plan: Pending improvement, likely home 7/8 if remains afebrile and able to advance diet    Consultants:   ENT over the phone by EDP  Procedures:   None   Antimicrobials:  Anti-infectives (From admission, onward)   Start     Dose/Rate Route Frequency Ordered Stop   09/26/17 0600  clindamycin (CLEOCIN) IVPB 600 mg     600 mg 100 mL/hr over 30 Minutes  Intravenous Every 8 hours 09/26/17 0248     09/25/17 2015  clindamycin (CLEOCIN) IVPB 600 mg     600 mg 100 mL/hr over 30 Minutes Intravenous  Once 09/25/17 2001 09/25/17 2120        Objective: Vitals:   09/26/17 0523 09/26/17 1419 09/26/17 2059 09/27/17 0546  BP: (!) 145/75 (!) 152/75 (!) 166/81 (!) 150/78  Pulse: 100 99 (!) 114 94  Resp: 18 20 19 18   Temp: 98.1 F (36.7 C) 98.7 F (37.1 C) (!) 100.4 F (38 C) 98.3 F (36.8 C)  TempSrc: Oral Oral Oral Oral  SpO2: 94% 98% 99% 99%  Weight:      Height:        Intake/Output Summary (Last 24 hours) at 09/27/2017 1040 Last data filed at 09/27/2017 0900 Gross per 24 hour  Intake 3055 ml  Output 302 ml  Net 2753 ml   Filed Weights   09/25/17 1903  Weight: 86.2 kg (190 lb)    Examination:  General exam: Appears calm and comfortable  ENT: No TTP neck, swelling has improved from yesterday  Respiratory system: Clear to auscultation. Respiratory effort normal. Cardiovascular system: S1 & S2 heard, RRR. No JVD, murmurs, rubs, gallops or clicks. No pedal edema. Gastrointestinal system: Abdomen is nondistended, soft and nontender. No organomegaly or masses felt. Normal bowel sounds heard. Central nervous system: Alert and oriented. No focal neurological deficits. Extremities: Symmetric 5 x 5 power. Skin: No rashes, lesions or ulcers Psychiatry: Judgement and insight appear normal. Mood & affect appropriate.   Data Reviewed: I have personally reviewed  following labs and imaging studies  CBC: Recent Labs  Lab 09/25/17 2014 09/26/17 0511 09/27/17 0509  WBC 24.9* 28.1* 21.7*  NEUTROABS 22.3* 25.6*  --   HGB 12.9 11.6* 11.5*  HCT 37.3 35.1* 35.4*  MCV 87.4 89.8 91.0  PLT 262 286 286   Basic Metabolic Panel: Recent Labs  Lab 09/25/17 2014 09/26/17 0511 09/27/17 0509  NA 137 142 141  K 4.2 3.8 3.8  CL 103 109 112*  CO2 24 25 22   GLUCOSE 119* 114* 87  BUN 14 10 9   CREATININE 1.16* 0.88 1.00  CALCIUM 8.6* 8.5* 9.1    GFR: Estimated Creatinine Clearance: 55.6 mL/min (by C-G formula based on SCr of 1 mg/dL). Liver Function Tests: Recent Labs  Lab 09/25/17 2014 09/26/17 0511  AST 180* 88*  ALT 134* 114*  ALKPHOS 86 80  BILITOT 1.0 0.9  PROT 7.8 7.0  ALBUMIN 3.5 3.0*   No results for input(s): LIPASE, AMYLASE in the last 168 hours. No results for input(s): AMMONIA in the last 168 hours. Coagulation Profile: No results for input(s): INR, PROTIME in the last 168 hours. Cardiac Enzymes: No results for input(s): CKTOTAL, CKMB, CKMBINDEX, TROPONINI in the last 168 hours. BNP (last 3 results) No results for input(s): PROBNP in the last 8760 hours. HbA1C: No results for input(s): HGBA1C in the last 72 hours. CBG: No results for input(s): GLUCAP in the last 168 hours. Lipid Profile: No results for input(s): CHOL, HDL, LDLCALC, TRIG, CHOLHDL, LDLDIRECT in the last 72 hours. Thyroid Function Tests: Recent Labs    09/25/17 1121 09/26/17 0646  TSH 0.58 0.866  FREET4 0.95  --    Anemia Panel: No results for input(s): VITAMINB12, FOLATE, FERRITIN, TIBC, IRON, RETICCTPCT in the last 72 hours. Sepsis Labs: Recent Labs  Lab 09/25/17 2245  LATICACIDVEN 0.69    No results found for this or any previous visit (from the past 240 hour(s)).     Radiology Studies: Dg Chest 2 View  Result Date: 09/25/2017 CLINICAL DATA:  Sore throat for 10 days with fever EXAM: CHEST - 2 VIEW COMPARISON:  04/17/2011 FINDINGS: The heart size and mediastinal contours are within normal limits. Both lungs are clear. Mild degenerative changes of the spine. IMPRESSION: No active cardiopulmonary disease. Electronically Signed   By: Jasmine PangKim  Fujinaga M.D.   On: 09/25/2017 20:52   [place] Thyroid  Result Date: 09/26/2017 CLINICAL DATA:  Incidental on CT. Left-sided thyroid nodule seen on preceding neck CT EXAM: THYROID ULTRASOUND TECHNIQUE: Ultrasound examination of the thyroid gland and adjacent soft tissues was performed.  COMPARISON:  Neck CT-09/24/2017 FINDINGS: Parenchymal Echotexture: Moderately heterogenous Isthmus: Normal in size measures 0.5 cm in diameter Right lobe: Normal in size measuring 4.1 x 1.9 x 1.6 cm Left lobe: Normal in size measuring 4.5 x 2.0 x 1.8 cm _________________________________________________________ Estimated total number of nodules >/= 1 cm: 3 Number of spongiform nodules >/=  2 cm not described below (TR1): 0 Number of mixed cystic and solid nodules >/= 1.5 cm not described below (TR2): 0 _________________________________________________________ Nodule # 1: Location: Left; Superior Maximum size: 1.1 cm; Other 2 dimensions: 0.7 x 0.7 cm Composition: solid/almost completely solid (2) Echogenicity: hypoechoic (2) Shape: not taller-than-wide (0) Margins: ill-defined (0) Echogenic foci: none (0) ACR TI-RADS total points: 4. ACR TI-RADS risk category: TR4 (4-6 points). ACR TI-RADS recommendations: *Given size (>/= 1 - 1.4 cm) and appearance, a follow-up ultrasound in 1 year should be considered based on TI-RADS criteria. _________________________________________________________ Nodule # 2: Location:  Left; Mid Maximum size: 1.1 cm; Other 2 dimensions: 1.0 x 0.9 cm Composition: solid/almost completely solid (2) Echogenicity: isoechoic (1) Shape: not taller-than-wide (0) Margins: ill-defined (0) Echogenic foci: none (0) ACR TI-RADS total points: 4. ACR TI-RADS risk category: TR3 (3 points). ACR TI-RADS recommendations: Given size (<1.4 cm) and appearance, this nodule does NOT meet TI-RADS criteria for biopsy or dedicated follow-up. _________________________________________________________ There is an approximately 0.9 cm spongiform/benign appearing nodule within the inferior pole the left lobe of the thyroid (labeled 3) which does not meet imaging criteria to recommend percutaneous sampling or continued dedicated follow-up. _________________________________________________________ Nodule # 4: Location: Left;  Inferior - this nodule correlates with the nodule seen on preceding neck CT Maximum size: 1.8 cm; Other 2 dimensions: 1.4 x 1.3 cm Composition: solid/almost completely solid (2) Echogenicity: hypoechoic (2) Shape: not taller-than-wide (0) Margins: smooth (0) Echogenic foci: none (0) ACR TI-RADS total points: 4. ACR TI-RADS risk category: TR4 (4-6 points). ACR TI-RADS recommendations: **Given size (>/= 1.5 cm) and appearance, fine needle aspiration of this moderately suspicious nodule should be considered based on TI-RADS criteria. _________________________________________________________ There is an approximately 0.4 cm anechoic cyst within the left side of the thyroid isthmus which contains an eccentric echogenic foci with ring down artifact compatible with benign colloid. This cyst does not meet imaging criteria to recommend percutaneous sampling or continued dedicated follow-up. There are several scattered punctate (sub 5 mm) anechoic cysts within the right lobe of the thyroid, none of which meet imaging criteria to recommend percutaneous sampling or continued dedicated follow-up. A small amount of fluid is seen within the right aryepiglottic fold (images 25 through 28) as demonstrated on preceding neck CT. IMPRESSION: 1. Findings suggestive multinodular goiter. 2. Nodule #4 within the posterior inferior pole of the left lobe of the thyroid, correlating with the nodule seen on preceding neck CT, meets imaging criteria to recommend percutaneous sampling as clinically indicated. 3. Nodule #1 meets imaging criteria to recommend a 1 year follow-up as clinically indicated. The above is in keeping with the ACR TI-RADS recommendations - J Am Coll Radiol 2017;14:587-595. Electronically Signed   By: Simonne Come M.D.   On: 09/26/2017 08:36      Scheduled Meds: . amLODipine  5 mg Oral Daily  . baclofen  10 mg Oral BID  . pravastatin  40 mg Oral q1800   Continuous Infusions: . clindamycin (CLEOCIN) IV Stopped  (09/27/17 0636)     LOS: 2 days    Time spent: 20 minutes   Noralee Stain, DO [facility] [email or web address] Password [email or web address] 09/27/2017, 10:40 AM

## 2017-09-27 NOTE — Telephone Encounter (Signed)
Referral to endocrinologist

## 2017-09-27 NOTE — Telephone Encounter (Signed)
Referred to ENT and Endocrinoloigst.

## 2017-09-28 DIAGNOSIS — J020 Streptococcal pharyngitis: Principal | ICD-10-CM

## 2017-09-28 LAB — CBC
HEMATOCRIT: 36 % (ref 36.0–46.0)
Hemoglobin: 12 g/dL (ref 12.0–15.0)
MCH: 29.7 pg (ref 26.0–34.0)
MCHC: 33.3 g/dL (ref 30.0–36.0)
MCV: 89.1 fL (ref 78.0–100.0)
Platelets: 331 10*3/uL (ref 150–400)
RBC: 4.04 MIL/uL (ref 3.87–5.11)
RDW: 15.2 % (ref 11.5–15.5)
WBC: 14.3 10*3/uL — AB (ref 4.0–10.5)

## 2017-09-28 LAB — BASIC METABOLIC PANEL
ANION GAP: 9 (ref 5–15)
BUN: 8 mg/dL (ref 8–23)
CALCIUM: 9.2 mg/dL (ref 8.9–10.3)
CHLORIDE: 107 mmol/L (ref 98–111)
CO2: 25 mmol/L (ref 22–32)
Creatinine, Ser: 0.88 mg/dL (ref 0.44–1.00)
GFR calc non Af Amer: >60 mL/min (ref >60)
GLUCOSE: 101 mg/dL — AB (ref 70–99)
Potassium: 3.9 mmol/L (ref 3.5–5.1)
Sodium: 141 mmol/L (ref 135–145)

## 2017-09-28 MED ORDER — CLINDAMYCIN HCL 300 MG PO CAPS: 300.0000 mg | ORAL_CAPSULE | Freq: Three times a day (TID) | ORAL | 0 refills | Status: AC

## 2017-09-28 MED ORDER — LOSARTAN POTASSIUM 50 MG PO TABS
25.0000 mg | ORAL_TABLET | Freq: Every day | ORAL | Status: DC
  Administered 2017-09-28: 25 mg via ORAL
  Filled 2017-09-28: qty 1

## 2017-09-28 MED ORDER — CLINDAMYCIN HCL 300 MG PO CAPS
600.0000 mg | ORAL_CAPSULE | Freq: Three times a day (TID) | ORAL | Status: DC
  Administered 2017-09-28: 600 mg via ORAL
  Filled 2017-09-28: qty 2

## 2017-09-28 NOTE — Discharge Instructions (Signed)

## 2017-09-28 NOTE — Discharge Summary (Addendum)
Physician Discharge Summary  Cynthia Montgomery ONG:[id number] DOB: 1950/08/04 DOA: 09/25/2017  PCP: Sharlene Dory, DO  Admit date: 09/25/2017 Discharge date: 09/28/2017  Admitted From: Home Disposition:  Home  Recommendations for Outpatient Follow-up:  1. Follow up with PCP in 1 week 2. Please obtain CBC in 1 week to ensure resolution of leukocytosis, and LFT in 1 week to ensure continued improvement in LFT 3. Please follow up on the following pending results: final blood culture result, negative growth to date on discharge 4. Thyroid US obtained as outpatient on 7/6 found multinodular goiter with nodules. This should be worked up as outpatient.   Discharge Condition: Stable, improved CODE STATUS: Full  Diet recommendation: Heart healthy   Brief/Interim Summary: Cynthia Montgomery a 67 y.o.femalewithhistory of hypertension, hyperlipidemia presents to the ER with complaints of worsening pain in the right side of the throat. Patient symptoms started 2 weeks ago which has been acutely worsening over the last 3 to 4 days. On July 4 patient had gone to the ER at [facility] and had CT soft neck tissue which shows acute pharyngitis with cervical lymphadenopathy. Patient at the time was given steroid shot and discharged home. Reviewing patient's notes during that ER visit it is stated that patient strep throat was recently negative. Patient comes back to the ER with worsening pain and difficult to swallow.EDP spoke with ENT who recommended antibiotics and to re-consult if clinical worsening. Patient was admitted for acute severe pharyngitis.   Patient was treated with IV clindamycin.  She continued to improve clinically, diet was advanced slowly.  Throat culture obtained as an outpatient on 7/5 returned with strep pyogenes.  On day of discharge, she remained afebrile, leukocytosis improving, tolerating full liquid diet without any issues. Pain continued to improve. She will be discharged  home to complete total 10-day course of clindamycin, last day 7/14.  Discharge Diagnoses:  Principal Problem:   Acute pharyngitis Active Problems:   HLD (hyperlipidemia)   Essential hypertension  Acute strep pyogenes pharyngitis Treated with IV clindamycin, improved. Will discharge home on PO clindamycin 300mg  TID for total 10 day course (last day tx 7/14)  Throat culture obtained 7/5 as outpatient positive for strep pyogenes  Advance to soft diet and continue to advance as tolerated   Essential hypertension Continue Norvasc, cozaar   Hyperlipidemia Continue Pravachol    Discharge Instructions  Discharge Instructions    Call MD for:   Complete by:  As directed    Difficulty swallowing food or liquid   Call MD for:  difficulty breathing, headache or visual disturbances   Complete by:  As directed    Call MD for:  extreme fatigue   Complete by:  As directed    Call MD for:  hives   Complete by:  As directed    Call MD for:  persistant dizziness or light-headedness   Complete by:  As directed    Call MD for:  persistant nausea and vomiting   Complete by:  As directed    Call MD for:  severe uncontrolled pain   Complete by:  As directed    Call MD for:  temperature >100.4   Complete by:  As directed    Diet - low sodium heart healthy   Complete by:  As directed    Discharge instructions   Complete by:  As directed    You were cared for by a hospitalist during your hospital stay. If you have any questions about your discharge medications  or the care you received while you were in the hospital after you are discharged, you can call the unit and ask to speak with the hospitalist on call if the hospitalist that took care of you is not available. Once you are discharged, your primary care physician will handle any further medical issues. Please note that NO REFILLS for any discharge medications will be authorized once you are discharged, as it is imperative that you return to  your primary care physician (or establish a relationship with a primary care physician if you do not have one) for your aftercare needs so that they can reassess your need for medications and monitor your lab values.   Increase activity slowly   Complete by:  As directed      Allergies as of 09/28/2017   No Known Allergies     Medication List    STOP taking these medications   meloxicam 7.5 MG tablet Commonly known as:  MOBIC     TAKE these medications   amLODipine 5 MG tablet Commonly known as:  NORVASC Take 1 tablet (5 mg total) by mouth daily.   aspirin EC 81 MG tablet Take 81 mg by mouth daily.   baclofen 10 MG tablet Commonly known as:  LIORESAL Take 10 mg by mouth 2 (two) times daily.   cholecalciferol 400 units Tabs tablet Commonly known as:  VITAMIN D Take 400 Units by mouth daily.   clindamycin 300 MG capsule Commonly known as:  CLEOCIN Take 1 capsule (300 mg total) by mouth 3 (three) times daily for 6 days.   losartan 25 MG tablet Commonly known as:  COZAAR Take 1 tablet (25 mg total) by mouth daily.   lovastatin 40 MG tablet Commonly known as:  MEVACOR Take 1 tablet (40 mg total) by mouth at bedtime.   multivitamin tablet Take 1 tablet by mouth daily.      Follow-up Information    Sharlene Dory, DO. Schedule an appointment as soon as possible for a visit in 1 week(s).   Specialty:  Family Medicine Contact information: [address] [place] [address] [phone]          No Known Allergies  Consultations:  ENT over the phone    Procedures/Studies: Dg Chest 2 View  Result Date: 09/25/2017 CLINICAL DATA:  Sore throat for 10 days with fever EXAM: CHEST - 2 VIEW COMPARISON:  04/17/2011 FINDINGS: The heart size and mediastinal contours are within normal limits. Both lungs are clear. Mild degenerative changes of the spine. IMPRESSION: No active cardiopulmonary disease. Electronically Signed   By: Jasmine Pang  M.D.   On: 09/25/2017 20:52   US Thyroid  Result Date: 09/26/2017 CLINICAL DATA:  Incidental on CT. Left-sided thyroid nodule seen on preceding neck CT EXAM: THYROID ULTRASOUND TECHNIQUE: Ultrasound examination of the thyroid gland and adjacent soft tissues was performed. COMPARISON:  Neck CT-09/24/2017 FINDINGS: Parenchymal Echotexture: Moderately heterogenous Isthmus: Normal in size measures 0.5 cm in diameter Right lobe: Normal in size measuring 4.1 x 1.9 x 1.6 cm Left lobe: Normal in size measuring 4.5 x 2.0 x 1.8 cm _________________________________________________________ Estimated total number of nodules >/= 1 cm: 3 Number of spongiform nodules >/=  2 cm not described below (TR1): 0 Number of mixed cystic and solid nodules >/= 1.5 cm not described below (TR2): 0 _________________________________________________________ Nodule # 1: Location: Left; Superior Maximum size: 1.1 cm; Other 2 dimensions: 0.7 x 0.7 cm Composition: solid/almost completely solid (2) Echogenicity:  hypoechoic (2) Shape: not taller-than-wide (0) Margins: ill-defined (0) Echogenic foci: none (0) ACR TI-RADS total points: 4. ACR TI-RADS risk category: TR4 (4-6 points). ACR TI-RADS recommendations: *Given size (>/= 1 - 1.4 cm) and appearance, a follow-up ultrasound in 1 year should be considered based on TI-RADS criteria. _________________________________________________________ Nodule # 2: Location: Left; Mid Maximum size: 1.1 cm; Other 2 dimensions: 1.0 x 0.9 cm Composition: solid/almost completely solid (2) Echogenicity: isoechoic (1) Shape: not taller-than-wide (0) Margins: ill-defined (0) Echogenic foci: none (0) ACR TI-RADS total points: 4. ACR TI-RADS risk category: TR3 (3 points). ACR TI-RADS recommendations: Given size (<1.4 cm) and appearance, this nodule does NOT meet TI-RADS criteria for biopsy or dedicated follow-up. _________________________________________________________ There is an approximately 0.9 cm spongiform/benign  appearing nodule within the inferior pole the left lobe of the thyroid (labeled 3) which does not meet imaging criteria to recommend percutaneous sampling or continued dedicated follow-up. _________________________________________________________ Nodule # 4: Location: Left; Inferior - this nodule correlates with the nodule seen on preceding neck CT Maximum size: 1.8 cm; Other 2 dimensions: 1.4 x 1.3 cm Composition: solid/almost completely solid (2) Echogenicity: hypoechoic (2) Shape: not taller-than-wide (0) Margins: smooth (0) Echogenic foci: none (0) ACR TI-RADS total points: 4. ACR TI-RADS risk category: TR4 (4-6 points). ACR TI-RADS recommendations: **Given size (>/= 1.5 cm) and appearance, fine needle aspiration of this moderately suspicious nodule should be considered based on TI-RADS criteria. _________________________________________________________ There is an approximately 0.4 cm anechoic cyst within the left side of the thyroid isthmus which contains an eccentric echogenic foci with ring down artifact compatible with benign colloid. This cyst does not meet imaging criteria to recommend percutaneous sampling or continued dedicated follow-up. There are several scattered punctate (sub 5 mm) anechoic cysts within the right lobe of the thyroid, none of which meet imaging criteria to recommend percutaneous sampling or continued dedicated follow-up. A small amount of fluid is seen within the right aryepiglottic fold (images 25 through 28) as demonstrated on preceding neck CT. IMPRESSION: 1. Findings suggestive multinodular goiter. 2. Nodule #4 within the posterior inferior pole of the left lobe of the thyroid, correlating with the nodule seen on preceding neck CT, meets imaging criteria to recommend percutaneous sampling as clinically indicated. 3. Nodule #1 meets imaging criteria to recommend a 1 year follow-up as clinically indicated. The above is in keeping with the ACR TI-RADS recommendations - J Am Coll  Radiol 2017;14:587-595. Electronically Signed   By: Simonne Come M.D.   On: 09/26/2017 08:36      Discharge Exam: Vitals:   09/28/17 0003 09/28/17 0525  BP: (!) 147/81 (!) 162/85  Pulse: (!) 113 96  Resp:  16  Temp:  98.6 F (37 C)  SpO2:  96%    General: Pt is alert, awake, not in acute distress ENT: Able to open mouth widely, anterior neck no longer TTP  Cardiovascular: RRR, S1/S2 +, no rubs, no gallops Respiratory: CTA bilaterally, no wheezing, no rhonchi Abdominal: Soft, NT, ND, bowel sounds + Extremities: no edema, no cyanosis    The results of significant diagnostics from this hospitalization (including imaging, microbiology, ancillary and laboratory) are listed below for reference.     Microbiology: Recent Results (from the past 240 hour(s))  Culture, Group A Strep     Status: Abnormal   Collection Time: 09/25/17 11:21 AM  Result Value Ref Range Status   MICRO NUMBER: [id number]  Final   SPECIMEN QUALITY: ADEQUATE  Final   SOURCE: NOT GIVEN  Final  STATUS: FINAL  Final   ISOLATE 1: Streptococcus pyogenes (A)  Final    Comment: Group A Streptococcus isolated Beta-hemolytic Streptococci are predictably susceptible to penicillin and other beta-lactams. Susceptibility testing not routinely performed.  Culture, blood (routine x 2)     Status: None (Preliminary result)   Collection Time: 09/25/17 10:20 PM  Result Value Ref Range Status   Specimen Description   Final    BLOOD RIGHT HAND Performed at [facility], [address] [place], [place] [address]    Special Requests   Final    BOTTLES DRAWN AEROBIC AND ANAEROBIC Blood Culture adequate volume Performed at [facility], [address], [place], [place] [address]    Culture   Final    NO GROWTH 1 DAY Performed at [facility], [address] [place], [place] [address]    Report Status PENDING  Incomplete  Culture, blood (routine x 2)     Status: None (Preliminary result)    Collection Time: 09/25/17 10:30 PM  Result Value Ref Range Status   Specimen Description   Final    BLOOD LEFT HAND Performed at [facility], [address] [place], [place] [address]    Special Requests   Final    BOTTLES DRAWN AEROBIC AND ANAEROBIC Blood Culture adequate volume Performed at [facility], [address] [place], [place] [address]    Culture   Final    NO GROWTH 1 DAY Performed at [facility], [address], [place], [place] [address]    Report Status PENDING  Incomplete     Labs: BNP (last 3 results) No results for input(s): BNP in the last 8760 hours. Basic Metabolic Panel: Recent Labs  Lab 09/25/17 2014 09/26/17 0511 09/27/17 0509 09/28/17 0531  NA 137 142 141 141  K 4.2 3.8 3.8 3.9  CL 103 109 112* 107  CO2 24 25 22 25   GLUCOSE 119* 114* 87 101*  BUN 14 10 9 8   CREATININE 1.16* 0.88 1.00 0.88  CALCIUM 8.6* 8.5* 9.1 9.2   Liver Function Tests: Recent Labs  Lab 09/25/17 2014 09/26/17 0511  AST 180* 88*  ALT 134* 114*  ALKPHOS 86 80  BILITOT 1.0 0.9  PROT 7.8 7.0  ALBUMIN 3.5 3.0*   No results for input(s): LIPASE, AMYLASE in the last 168 hours. No results for input(s): AMMONIA in the last 168 hours. CBC: Recent Labs  Lab 09/25/17 2014 09/26/17 0511 09/27/17 0509 09/28/17 0531  WBC 24.9* 28.1* 21.7* 14.3*  NEUTROABS 22.3* 25.6*  --   --   HGB 12.9 11.6* 11.5* 12.0  HCT 37.3 35.1* 35.4* 36.0  MCV 87.4 89.8 91.0 89.1  PLT 262 286 286 331   Cardiac Enzymes: No results for input(s): CKTOTAL, CKMB, CKMBINDEX, TROPONINI in the last 168 hours. BNP: Invalid input(s): POCBNP CBG: No results for input(s): GLUCAP in the last 168 hours. D-Dimer No results for input(s): DDIMER in the last 72 hours. Hgb A1c No results for input(s): HGBA1C in the last 72 hours. Lipid Profile No results for input(s): CHOL, HDL, LDLCALC, TRIG, CHOLHDL, LDLDIRECT in the last 72 hours. Thyroid function studies Recent Labs     09/26/17 0646  TSH 0.866   Anemia work up No results for input(s): VITAMINB12, FOLATE, FERRITIN, TIBC, IRON, RETICCTPCT in the last 72 hours. Urinalysis No results found for: COLORURINE, APPEARANCEUR, LABSPEC, PHURINE, GLUCOSEU, HGBUR, BILIRUBINUR, KETONESUR, PROTEINUR, UROBILINOGEN, NITRITE,  LEUKOCYTESUR Sepsis Labs Invalid input(s): PROCALCITONIN,  WBC,  LACTICIDVEN Microbiology Recent Results (from the past 240 hour(s))  Culture, Group A Strep     Status: Abnormal   Collection Time: 09/25/17 11:21 AM  Result Value Ref Range Status   MICRO NUMBER: [id number]  Final   SPECIMEN QUALITY: ADEQUATE  Final   SOURCE: NOT GIVEN  Final   STATUS: FINAL  Final   ISOLATE 1: Streptococcus pyogenes (A)  Final    Comment: Group A Streptococcus isolated Beta-hemolytic Streptococci are predictably susceptible to penicillin and other beta-lactams. Susceptibility testing not routinely performed.  Culture, blood (routine x 2)     Status: None (Preliminary result)   Collection Time: 09/25/17 10:20 PM  Result Value Ref Range Status   Specimen Description   Final    BLOOD RIGHT HAND Performed at [facility], [address] [place], [place] [address]    Special Requests   Final    BOTTLES DRAWN AEROBIC AND ANAEROBIC Blood Culture adequate volume Performed at [facility], [address] [place], [place] [address]    Culture   Final    NO GROWTH 1 DAY Performed at [facility], [address] [place], [place] [address]    Report Status PENDING  Incomplete  Culture, blood (routine x 2)     Status: None (Preliminary result)   Collection Time: 09/25/17 10:30 PM  Result Value Ref Range Status   Specimen Description   Final    BLOOD LEFT HAND Performed at [facility] [place], [address] [place], [place] [address]    Special Requests   Final    BOTTLES DRAWN AEROBIC AND ANAEROBIC Blood Culture adequate volume Performed at [facility] [place], [address] [place], [place] [address]    Culture   Final    NO GROWTH 1 DAY Performed at [facility], [address] [place], [place] [address]    Report Status PENDING  Incomplete     Patient was seen and examined on the day of discharge and was found to be in stable condition. Time coordinating discharge: 25 minutes including assessment and coordination of care, as well as examination of the patient.   SIGNED:  Noralee StainJennifer Myrle Dues, DO [facility] Pager [phone]  If 7PM-7AM, please contact night-coverage [email or web address] Password [email or web address] 09/28/2017, 10:42 AM

## 2017-09-29 ENCOUNTER — Telehealth: Payer: Self-pay

## 2017-09-29 NOTE — Telephone Encounter (Signed)
09/29/17   Transition Care Management Follow-up Telephone Call  ADMISSION DATE: 09/25/2017  DISCHARGE DATE: 09/28/2017  PATIENT NEEDS CBC DRAWN TO ENSURE RESOLUTION OF LEUKOCYTOSIS  How have you been since you were released from the hospital? Feeling much better per patient. Do you understand why you were in the hospital? Yes, just unsure of the cause.  Do you understand the discharge instrcutions? Yes    Items Reviewed:  Medications reviewed: Yes  Allergies reviewed: NKDA   Dietary changes reviewed: Low Sodium Heart Healthy  Referrals reviewed:Appointment scheduled for Hospital follow up visit  Functional Questionnaire:  Activities of Daily Living (ADLs): Patient can perform all independently.  Any patient concerns? None mailed copy of results of Thyroid US. May need to review during appointment.   Confirmed importance and date/time of follow-up visits scheduled:Yes    Confirmed with patient if condition begins to worsen call PCP or go to the ER. Yes     Patient was given the office number and encouragred to call back with questions or concerns. Yes

## 2017-10-01 ENCOUNTER — Encounter: Payer: Self-pay | Admitting: Endocrinology

## 2017-10-01 LAB — CULTURE, BLOOD (ROUTINE X 2)
CULTURE: NO GROWTH
Culture: NO GROWTH
SPECIAL REQUESTS: ADEQUATE
Special Requests: ADEQUATE

## 2017-10-05 ENCOUNTER — Ambulatory Visit (INDEPENDENT_AMBULATORY_CARE_PROVIDER_SITE_OTHER): Payer: Medicare HMO | Admitting: Family Medicine

## 2017-10-05 ENCOUNTER — Encounter: Payer: Self-pay | Admitting: Family Medicine

## 2017-10-05 VITALS — BP 140/80 | HR 80 | Temp 98.1°F | Ht 62.0 in | Wt 188.1 lb

## 2017-10-05 DIAGNOSIS — J020 Streptococcal pharyngitis: Secondary | ICD-10-CM | POA: Diagnosis not present

## 2017-10-05 LAB — COMPREHENSIVE METABOLIC PANEL
ALBUMIN: 3.4 g/dL — AB (ref 3.5–5.2)
ALT: 34 U/L (ref 0–35)
AST: 17 U/L (ref 0–37)
Alkaline Phosphatase: 98 U/L (ref 39–117)
BUN: 19 mg/dL (ref 6–23)
CHLORIDE: 105 meq/L (ref 96–112)
CO2: 28 meq/L (ref 19–32)
CREATININE: 0.94 mg/dL (ref 0.40–1.20)
Calcium: 10.1 mg/dL (ref 8.4–10.5)
GFR: 76.34 mL/min (ref >60.00)
Glucose, Bld: 91 mg/dL (ref 70–99)
Potassium: 4.1 mEq/L (ref 3.5–5.1)
Sodium: 141 mEq/L (ref 135–145)
Total Bilirubin: 0.2 mg/dL (ref 0.2–1.2)
Total Protein: 7.9 g/dL (ref 6.0–8.3)

## 2017-10-05 LAB — CBC
HEMATOCRIT: 34.9 % — AB (ref 36.0–46.0)
Hemoglobin: 11.9 g/dL — ABNORMAL LOW (ref 12.0–15.0)
MCHC: 34 g/dL (ref 30.0–36.0)
MCV: 88.8 fl (ref 78.0–100.0)
Platelets: 503 10*3/uL — ABNORMAL HIGH (ref 150.0–400.0)
RBC: 3.93 Mil/uL (ref 3.87–5.11)
RDW: 14.9 % (ref 11.5–15.5)
WBC: 5 10*3/uL (ref 4.0–10.5)

## 2017-10-05 NOTE — Patient Instructions (Addendum)
It was good seeing you today.  Let us know if you need anything.

## 2017-10-05 NOTE — Progress Notes (Signed)
Chief Complaint  Patient presents with  . Hospitalization Follow-up    HPI Cynthia Montgomery is a 67 y.o. y.o. female who presents for a transition of care visit.  Pt was discharged from [facility] on 09/28/17.  Within 48 business hours of discharge our office contacted him via telephone to coordinate his care and needs.   Pt was admitted to hospital for pharyngitis. Started on Clinda and monitored.  She subsequently improved and was discharged home 1 week ago.  She finished her last dose of outpatient oral antibiotic yesterday.  She is feeling much better overall.  Her blood cultures showed no growth to date by day 5.  Of note, she did have an ultrasound that showed a multinodular goiter.  She was subsequently referred to ENT and endocrinology.  Past Medical History:  Diagnosis Date  . Arthritis   . Cataract   . Colon polyps   . High cholesterol   . Hypertension   . Positive TB test    Family History  Problem Relation Age of Onset  . Arthritis Mother   . Hypertension Mother   . Cancer Father   . Diabetes Sister   . Hypertension Sister   . Arthritis Daughter   . Asthma Daughter   . Kidney disease Daughter    Allergies as of 10/05/2017   No Known Allergies     Medication List        Accurate as of 10/05/17 11:37 AM. Always use your most recent med list.          amLODipine 5 MG tablet Commonly known as:  NORVASC Take 1 tablet (5 mg total) by mouth daily.   baclofen 10 MG tablet Commonly known as:  LIORESAL Take 10 mg by mouth 2 (two) times daily.   cholecalciferol 400 units Tabs tablet Commonly known as:  VITAMIN D Take 400 Units by mouth daily.   losartan 25 MG tablet Commonly known as:  COZAAR Take 1 tablet (25 mg total) by mouth daily.   lovastatin 40 MG tablet Commonly known as:  MEVACOR Take 1 tablet (40 mg total) by mouth at bedtime.   multivitamin tablet Take 1 tablet by mouth daily.       ROS:  Constitutional: No fevers or chills, no weight loss HEENT:  No headaches, hearing loss, or runny nose, no sore throat Heart: No chest pain Lungs: No SOB, no cough Abd: No bowel changes, no pain, no N/V GU: No urinary complaints Neuro: No numbness, tingling or weakness Msk: No joint or muscle pain  Objective BP 140/80 (BP Location: Left Arm, Patient Position: Sitting, Cuff Size: Normal)   Pulse 80   Temp 98.1 F (36.7 C) (Oral)   Ht 5\' 2"  (1.575 m)   Wt 188 lb 2 oz (85.3 kg)   SpO2 97%   BMI 34.41 kg/m  General Appearance:  awake, alert, oriented, in no acute distress and well developed, well nourished Skin:  there are no suspicious lesions or rashes of concern Head/face:  NCAT Eyes:  EOMI, PERRLA Ears:  canals and TMs NI Nose/Sinuses:  negative Mouth/Throat:  Mucosa moist, no lesions; pharynx without erythema, edema or exudate. Neck:  neck- supple, no mass, non-tender and no jvd Lungs: Clear to auscultation.  No rales, rhonchi, or wheezing. Normal effort, no accessory muscle use. Heart:  Heart sounds are normal.  Regular rate and rhythm without murmur, gallop or rub. No bruits. Abdomen:  BS+, soft, NT, ND, no masses or organomegaly Musculoskeletal:  No muscle group  atrophy or asymmetry, gait normal Neurologic:  Alert and oriented x 3, gait normal., reflexes normal and symmetric, strength and  sensation grossly normal Psych exam: Nml mood and affect, age appropriate judgment and insight  Acute streptococcal pharyngitis - Plan: Comprehensive metabolic panel, CBC  Discharge summary and medication list have been reviewed/reconciled.  Labs pending at the time of discharge have been reviewed or are still pending at the time of this visit.  Follow-up labs and appointments have been ordered and/or coordinated appropriately.  I think she should keep the ENT appointment, she can hold off on the endocrinology appt for now. Educational materials regarding the patient's admitting diagnosis provided.  TRANSITIONAL CARE MANAGEMENT CERTIFICATION:  I  certify the following are true:   1. Communication with the patient/care giver was made within 2 business days of discharge.  2. Complexity of Medical decision making is moderate.  3. Face to face visit occurred within 14 days of discharge.   F/u as originally scheduled. The patient voiced understanding and agreement to the plan.  Jilda Rocheicholas Paul WinfieldWendling, DO 10/05/17 11:37 AM

## 2017-10-05 NOTE — Progress Notes (Signed)
Pre visit review using our clinic review tool, if applicable. No additional management support is needed unless otherwise documented below in the visit note. 

## 2017-10-12 ENCOUNTER — Ambulatory Visit: Payer: Self-pay | Admitting: Family Medicine

## 2017-10-12 ENCOUNTER — Telehealth: Payer: Self-pay | Admitting: Family Medicine

## 2017-10-12 NOTE — Telephone Encounter (Signed)
Before calling the pt I noticed she has sent in a request to Dr. Carmelia RollerWendling requesting a referral to another ENT this morning due to wanting to be seen sooner for her throat.  I contacted the flow coordinator [facility] to touch base with them prior to calling the pt due to her request to see another ENT due to her throat feeling tight.   She is requesting to be seen sooner by an ENT.  The flow coordinator said Dr. Carmelia RollerWendling is pretty good at responding to his messages pretty quickly.    "I will keep an eye on it and follow up with the pt".   "She just called in a few minutes ago".    I did not triage the pt based on this above information.

## 2017-10-12 NOTE — Telephone Encounter (Signed)
OK if we can find someone who can get her in sooner. TY.

## 2017-10-12 NOTE — Telephone Encounter (Signed)
Copied from CRM [phone]#[id number]. Topic: Referral - Question >> Oct 12, 2017  8:42 AM Luanna Coleawoud, Jessica L wrote: CRM for notification. See Telephone encounter for: 10/12/17.pt is wanting to know if she can be referred to a different ENT that can get her in asap. Her next appointment is 8/7 and wants to be seen asap. Please advise

## 2017-10-20 NOTE — Telephone Encounter (Signed)
Pt has appt on 8/7 pt can contact office directly to get on cancellation list

## 2017-10-20 NOTE — Telephone Encounter (Signed)
Please let pt know. At this point, unlikely another group could get her in sooner. TY.

## 2017-10-26 ENCOUNTER — Encounter: Payer: Medicare HMO | Admitting: Family Medicine

## 2017-10-28 DIAGNOSIS — E041 Nontoxic single thyroid nodule: Secondary | ICD-10-CM

## 2017-10-28 DIAGNOSIS — K117 Disturbances of salivary secretion: Secondary | ICD-10-CM

## 2017-10-28 HISTORY — DX: Disturbances of salivary secretion: K11.7

## 2017-10-28 HISTORY — DX: Nontoxic single thyroid nodule: E04.1

## 2017-10-29 ENCOUNTER — Encounter: Payer: Medicare HMO | Admitting: Family Medicine

## 2017-12-11 NOTE — Progress Notes (Signed)
Subjective:   Cynthia Montgomery is a 67 y.o. female who presents for an Initial Medicare Annual Wellness Visit.  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 About 12-15 hrs per week.   Review of Systems   No ROS.  Medicare Wellness Visit. Additional risk factors are reflected in the social history. Cardiac Risk Factors include: advanced age (>5355men, 79>65 women);dyslipidemia;hypertension;obesity (BMI >30kg/m2) Sleep patterns: no issues.  Home Safety/Smoke Alarms: Feels safe in home. Smoke alarms in place. Lives with grandson in 1 story home.    Female:   Pap- pt states she will schedule.      Mammo-ordered    Dexa scan- 02/25/16        CCS- last 2012 per care everywhere. Next due 09/15/20 Eye- [facility] yearly. utd per pt    Objective:    Today's Vitals   12/14/17 1322  BP: (!) 142/75  Pulse: 65  Temp: 98.1 F (36.7 C)  SpO2: 96%  Weight: 189 lb (85.7 kg)  Height: 5\' 2"  (1.575 m)   Body mass index is 34.57 kg/m.  Advanced Directives 12/14/2017 09/26/2017  Does Patient Have a Medical Advance Directive? No No  Would patient like information on creating a medical advance directive? No - Patient declined No - Patient declined    Current Medications (verified) Outpatient Encounter Medications as of 12/14/2017  Medication Sig  . amLODipine (NORVASC) 5 MG tablet Take 1 tablet (5 mg total) by mouth daily.  . baclofen (LIORESAL) 10 MG tablet Take 10 mg by mouth 2 (two) times daily.  . cholecalciferol (VITAMIN D) 400 units TABS tablet Take 400 Units by mouth daily.  Marland Kitchen. losartan (COZAAR) 25 MG tablet Take 1 tablet (25 mg total) by mouth daily.  Marland Kitchen. lovastatin (MEVACOR) 40 MG tablet Take 1 tablet (40 mg total) by mouth at bedtime.  . Multiple Vitamin (MULTIVITAMIN) tablet Take 1 tablet by mouth daily.   No facility-administered encounter medications on file as of 12/14/2017.     Allergies (verified) Patient has no known allergies.   History: Past Medical History:    Diagnosis Date  . Arthritis   . Cataract   . Colon polyps   . High cholesterol   . Hypertension   . Positive TB test    Past Surgical History:  Procedure Laterality Date  . CATARACT EXTRACTION, BILATERAL  2018  . KNEE SURGERY    . TUBAL LIGATION     Family History  Problem Relation Age of Onset  . Arthritis Mother   . Hypertension Mother   . Cancer Father   . Diabetes Sister   . Hypertension Sister   . Arthritis Daughter   . Asthma Daughter   . Kidney disease Daughter    Social History   Socioeconomic History  . Marital status: Legally Separated    Spouse name: Not on file  . Number of children: Not on file  . Years of education: Not on file  . Highest education level: Not on file  Occupational History  . Not on file  Social Needs  . Financial resource strain: Not on file  . Food insecurity:    Worry: Not on file    Inability: Not on file  . Transportation needs:    Medical: Not on file    Non-medical: Not on file  Tobacco Use  . Smoking status: Never Smoker  . Smokeless tobacco: Never Used  Substance and Sexual Activity  . Alcohol use: No  . Drug use: No  .  Sexual activity: Yes  Lifestyle  . Physical activity:    Days per week: Not on file    Minutes per session: Not on file  . Stress: Not on file  Relationships  . Social connections:    Talks on phone: Not on file    Gets together: Not on file    Attends religious service: Not on file    Active member of club or organization: Not on file    Attends meetings of clubs or organizations: Not on file    Relationship status: Not on file  Other Topics Concern  . Not on file  Social History Narrative  . Not on file    Tobacco Counseling Counseling given: Not Answered   Clinical Intake: Pain : No/denies pain   Activities of Daily Living In your present state of health, do you have any difficulty performing the following activities: 12/14/2017 09/26/2017  Hearing? N N  Vision? N N  Difficulty  concentrating or making decisions? N N  Walking or climbing stairs? N N  Dressing or bathing? N N  Doing errands, shopping? N N  Preparing Food and eating ? N -  Using the Toilet? N -  In the past six months, have you accidently leaked urine? N -  Do you have problems with loss of bowel control? N -  Managing your Medications? N -  Managing your Finances? N -  Housekeeping or managing your Housekeeping? N -  Some recent data might be hidden     Immunizations and Health Maintenance  There is no immunization history on file for this patient. Health Maintenance Due  Topic Date Due  . Hepatitis C Screening  1950-08-28  . TETANUS/TDAP  07/28/1969  . COLONOSCOPY  07/28/2000  . MAMMOGRAM  05/01/2011  . DEXA SCAN  07/29/2015  . PNA vac Low Risk Adult (1 of 2 - PCV13) 07/29/2015  . INFLUENZA VACCINE  10/22/2017    Patient Care Team: Sharlene Dory, DO as PCP - General (Family Medicine)  Indicate any recent Medical Services you may have received from other than [facility] providers in the past year (date may be approximate).     Assessment:   This is a routine wellness examination for [place].Physical assessment deferred to PCP.  Hearing/Vision screen  Visual Acuity Screening   Right eye Left eye Both eyes  Without correction: 20/50 20/40 20/32   With correction:     Hearing Screening Comments: Able to hear conversational tones w/o difficulty. No issues reported.    Dietary issues and exercise activities discussed: Current Exercise Habits: The patient does not participate in regular exercise at present, Exercise limited by: None identified Diet (meal preparation, eat out, water intake, caffeinated beverages, dairy products, fruits and vegetables): well balanced  Goals    . Increase physical activity      Depression Screen PHQ 2/9 Scores 12/14/2017  PHQ - 2 Score 0    Fall Risk Fall Risk  12/14/2017  Falls in the past year? No    Cognitive Function: Ad8 score reviewed  for issues:  Issues making decisions:no  Less interest in hobbies / activities:no  Repeats questions, stories (family complaining):no  Trouble using ordinary gadgets (microwave, computer, phone):no  Forgets the month or year: no  Mismanaging finances: no  Remembering appts:no  Daily problems with thinking and/or memory:no Ad8 score is=0        Screening Tests Health Maintenance  Topic Date Due  . Hepatitis C Screening  [birth year]  . TETANUS/TDAP  07/28/1969  . COLONOSCOPY  07/28/2000  . MAMMOGRAM  05/01/2011  . DEXA SCAN  07/29/2015  . PNA vac Low Risk Adult (1 of 2 - PCV13) 07/29/2015  . INFLUENZA VACCINE  10/22/2017     Plan:    Please schedule your next medicare wellness visit with me in 1 yr.  I have ordered your mammogram. Please schedule.  Continue to eat heart healthy diet (full of fruits, vegetables, whole grains, lean protein, water--limit salt, fat, and sugar intake) and increase physical activity as tolerated.    I have personally reviewed and noted the following in the patient's chart:   . Medical and social history . Use of alcohol, tobacco or illicit drugs  . Current medications and supplements . Functional ability and status . Nutritional status . Physical activity . Advanced directives . List of other physicians . Hospitalizations, surgeries, and ER visits in previous 12 months . Vitals . Screenings to include cognitive, depression, and falls . Referrals and appointments  In addition, I have reviewed and discussed with patient certain preventive protocols, quality metrics, and best practice recommendations. A written personalized care plan for preventive services as well as general preventive health recommendations were provided to patient.     Mady Haagensen [place], [place]   12/14/2017

## 2017-12-14 ENCOUNTER — Encounter: Payer: Self-pay | Admitting: *Deleted

## 2017-12-14 ENCOUNTER — Ambulatory Visit (INDEPENDENT_AMBULATORY_CARE_PROVIDER_SITE_OTHER): Payer: Medicare HMO | Admitting: *Deleted

## 2017-12-14 VITALS — BP 142/75 | HR 65 | Temp 98.1°F | Ht 62.0 in | Wt 189.0 lb

## 2017-12-14 DIAGNOSIS — Z1239 Encounter for other screening for malignant neoplasm of breast: Secondary | ICD-10-CM

## 2017-12-14 DIAGNOSIS — Z23 Encounter for immunization: Secondary | ICD-10-CM

## 2017-12-14 DIAGNOSIS — Z0000 Encounter for general adult medical examination without abnormal findings: Secondary | ICD-10-CM | POA: Diagnosis not present

## 2017-12-14 NOTE — Progress Notes (Signed)
Noted. Agree with above.  Jilda Rocheicholas Paul KenhorstWendling, DO 12/14/17 1:41 PM

## 2017-12-14 NOTE — Patient Instructions (Signed)
Please schedule your next medicare wellness visit with me in 1 yr.  I have ordered your mammogram. Please schedule.  Continue to eat heart healthy diet (full of fruits, vegetables, whole grains, lean protein, water--limit salt, fat, and sugar intake) and increase physical activity as tolerated.   Cynthia Montgomery , Thank you for taking time to come for your Medicare Wellness Visit. I appreciate your ongoing commitment to your health goals. Please review the following plan we discussed and let me know if I can assist you in the future.   These are the goals we discussed: Goals    . Increase physical activity       This is a list of the screening recommended for you and due dates:  Health Maintenance  Topic Date Due  .  Hepatitis C: One time screening is recommended by Center for Disease Control  (CDC) for  adults born from [birth year] through 1965.   [birth year]  . Tetanus Vaccine  07/28/1969  . Colon Cancer Screening  07/28/2000  . Mammogram  05/01/2011  . DEXA scan (bone density measurement)  07/29/2015  . Pneumonia vaccines (1 of 2 - PCV13) 07/29/2015  . Flu Shot  10/22/2017     Health Maintenance for Postmenopausal Women Menopause is a normal process in which your reproductive ability comes to an end. This process happens gradually over a span of months to years, usually between the ages of 52 and 42. Menopause is complete when you have missed 12 consecutive menstrual periods. It is important to talk with your health care provider about some of the most common conditions that affect postmenopausal women, such as heart disease, cancer, and bone loss (osteoporosis). Adopting a healthy lifestyle and getting preventive care can help to promote your health and wellness. Those actions can also lower your chances of developing some of these common conditions. What should I know about menopause? During menopause, you may experience a number of symptoms, such as:  Moderate-to-severe hot  flashes.  Night sweats.  Decrease in sex drive.  Mood swings.  Headaches.  Tiredness.  Irritability.  Memory problems.  Insomnia.  Choosing to treat or not to treat menopausal changes is an individual decision that you make with your health care provider. What should I know about hormone replacement therapy and supplements? Hormone therapy products are effective for treating symptoms that are associated with menopause, such as hot flashes and night sweats. Hormone replacement carries certain risks, especially as you become older. If you are thinking about using estrogen or estrogen with progestin treatments, discuss the benefits and risks with your health care provider. What should I know about heart disease and stroke? Heart disease, heart attack, and stroke become more likely as you age. This may be due, in part, to the hormonal changes that your body experiences during menopause. These can affect how your body processes dietary fats, triglycerides, and cholesterol. Heart attack and stroke are both medical emergencies. There are many things that you can do to help prevent heart disease and stroke:  Have your blood pressure checked at least every 1-2 years. High blood pressure causes heart disease and increases the risk of stroke.  If you are 2-88 years old, ask your health care provider if you should take aspirin to prevent a heart attack or a stroke.  Do not use any tobacco products, including cigarettes, chewing tobacco, or electronic cigarettes. If you need help quitting, ask your health care provider.  It is important to eat a healthy diet and  maintain a healthy weight. ? Be sure to include plenty of vegetables, fruits, low-fat dairy products, and lean protein. ? Avoid eating foods that are high in solid fats, added sugars, or salt (sodium).  Get regular exercise. This is one of the most important things that you can do for your health. ? Try to exercise for at least 150  minutes each week. The type of exercise that you do should increase your heart rate and make you sweat. This is known as moderate-intensity exercise. ? Try to do strengthening exercises at least twice each week. Do these in addition to the moderate-intensity exercise.  Know your numbers.Ask your health care provider to check your cholesterol and your blood glucose. Continue to have your blood tested as directed by your health care provider.  What should I know about cancer screening? There are several types of cancer. Take the following steps to reduce your risk and to catch any cancer development as early as possible. Breast Cancer  Practice breast self-awareness. ? This means understanding how your breasts normally appear and feel. ? It also means doing regular breast self-exams. Let your health care provider know about any changes, no matter how small.  If you are 81 or older, have a clinician do a breast exam (clinical breast exam or CBE) every year. Depending on your age, family history, and medical history, it may be recommended that you also have a yearly breast X-ray (mammogram).  If you have a family history of breast cancer, talk with your health care provider about genetic screening.  If you are at high risk for breast cancer, talk with your health care provider about having an MRI and a mammogram every year.  Breast cancer (BRCA) gene test is recommended for women who have family members with BRCA-related cancers. Results of the assessment will determine the need for genetic counseling and BRCA1 and for BRCA2 testing. BRCA-related cancers include these types: ? Breast. This occurs in males or females. ? Ovarian. ? Tubal. This may also be called fallopian tube cancer. ? Cancer of the abdominal or pelvic lining (peritoneal cancer). ? Prostate. ? Pancreatic.  Cervical, Uterine, and Ovarian Cancer Your health care provider may recommend that you be screened regularly for cancer  of the pelvic organs. These include your ovaries, uterus, and vagina. This screening involves a pelvic exam, which includes checking for microscopic changes to the surface of your cervix (Pap test).  For women ages 21-65, health care providers may recommend a pelvic exam and a Pap test every three years. For women ages 52-65, they may recommend the Pap test and pelvic exam, combined with testing for human papilloma virus (HPV), every five years. Some types of HPV increase your risk of cervical cancer. Testing for HPV may also be done on women of any age who have unclear Pap test results.  Other health care providers may not recommend any screening for nonpregnant women who are considered low risk for pelvic cancer and have no symptoms. Ask your health care provider if a screening pelvic exam is right for you.  If you have had past treatment for cervical cancer or a condition that could lead to cancer, you need Pap tests and screening for cancer for at least 20 years after your treatment. If Pap tests have been discontinued for you, your risk factors (such as having a new sexual partner) need to be reassessed to determine if you should start having screenings again. Some women have medical problems that increase the  chance of getting cervical cancer. In these cases, your health care provider may recommend that you have screening and Pap tests more often.  If you have a family history of uterine cancer or ovarian cancer, talk with your health care provider about genetic screening.  If you have vaginal bleeding after reaching menopause, tell your health care provider.  There are currently no reliable tests available to screen for ovarian cancer.  Lung Cancer Lung cancer screening is recommended for adults 73-60 years old who are at high risk for lung cancer because of a history of smoking. A yearly low-dose CT scan of the lungs is recommended if you:  Currently smoke.  Have a history of at least 30  pack-years of smoking and you currently smoke or have quit within the past 15 years. A pack-year is smoking an average of one pack of cigarettes per day for one year.  Yearly screening should:  Continue until it has been 15 years since you quit.  Stop if you develop a health problem that would prevent you from having lung cancer treatment.  Colorectal Cancer  This type of cancer can be detected and can often be prevented.  Routine colorectal cancer screening usually begins at age 24 and continues through age 52.  If you have risk factors for colon cancer, your health care provider may recommend that you be screened at an earlier age.  If you have a family history of colorectal cancer, talk with your health care provider about genetic screening.  Your health care provider may also recommend using home test kits to check for hidden blood in your stool.  A small camera at the end of a tube can be used to examine your colon directly (sigmoidoscopy or colonoscopy). This is done to check for the earliest forms of colorectal cancer.  Direct examination of the colon should be repeated every 5-10 years until age 94. However, if early forms of precancerous polyps or small growths are found or if you have a family history or genetic risk for colorectal cancer, you may need to be screened more often.  Skin Cancer  Check your skin from head to toe regularly.  Monitor any moles. Be sure to tell your health care provider: ? About any new moles or changes in moles, especially if there is a change in a mole's shape or color. ? If you have a mole that is larger than the size of a pencil eraser.  If any of your family members has a history of skin cancer, especially at a young age, talk with your health care provider about genetic screening.  Always use sunscreen. Apply sunscreen liberally and repeatedly throughout the day.  Whenever you are outside, protect yourself by wearing long sleeves, pants,  a wide-brimmed hat, and sunglasses.  What should I know about osteoporosis? Osteoporosis is a condition in which bone destruction happens more quickly than new bone creation. After menopause, you may be at an increased risk for osteoporosis. To help prevent osteoporosis or the bone fractures that can happen because of osteoporosis, the following is recommended:  If you are 30-57 years old, get at least 1,000 mg of calcium and at least 600 mg of vitamin D per day.  If you are older than age 35 but younger than age 43, get at least 1,200 mg of calcium and at least 600 mg of vitamin D per day.  If you are older than age 43, get at least 1,200 mg of calcium and at  least 800 mg of vitamin D per day.  Smoking and excessive alcohol intake increase the risk of osteoporosis. Eat foods that are rich in calcium and vitamin D, and do weight-bearing exercises several times each week as directed by your health care provider. What should I know about how menopause affects my mental health? Depression may occur at any age, but it is more common as you become older. Common symptoms of depression include:  Low or sad mood.  Changes in sleep patterns.  Changes in appetite or eating patterns.  Feeling an overall lack of motivation or enjoyment of activities that you previously enjoyed.  Frequent crying spells.  Talk with your health care provider if you think that you are experiencing depression. What should I know about immunizations? It is important that you get and maintain your immunizations. These include:  Tetanus, diphtheria, and pertussis (Tdap) booster vaccine.  Influenza every year before the flu season begins.  Pneumonia vaccine.  Shingles vaccine.  Your health care provider may also recommend other immunizations. This information is not intended to replace advice given to you by your health care provider. Make sure you discuss any questions you have with your health care  provider. Document Released: 05/02/2005 Document Revised: 09/28/2015 Document Reviewed: 12/12/2014 Elsevier Interactive Patient Education  2018 [employer].

## 2017-12-28 ENCOUNTER — Encounter (HOSPITAL_BASED_OUTPATIENT_CLINIC_OR_DEPARTMENT_OTHER): Payer: Self-pay

## 2017-12-28 ENCOUNTER — Ambulatory Visit (HOSPITAL_BASED_OUTPATIENT_CLINIC_OR_DEPARTMENT_OTHER)
Admission: RE | Admit: 2017-12-28 | Discharge: 2017-12-28 | Disposition: A | Discharge status: Home / Self Care | Payer: Medicare HMO | Source: Ambulatory Visit | Attending: Family Medicine | Admitting: Family Medicine

## 2017-12-28 DIAGNOSIS — Z1239 Encounter for other screening for malignant neoplasm of breast: Secondary | ICD-10-CM

## 2017-12-28 DIAGNOSIS — Z1231 Encounter for screening mammogram for malignant neoplasm of breast: Secondary | ICD-10-CM | POA: Diagnosis present

## 2018-01-11 ENCOUNTER — Other Ambulatory Visit (HOSPITAL_COMMUNITY)
Admission: RE | Admit: 2018-01-11 | Discharge: 2018-01-11 | Disposition: A | Discharge status: Home / Self Care | Payer: Medicare HMO | Source: Ambulatory Visit | Attending: Obstetrics & Gynecology | Admitting: Obstetrics & Gynecology

## 2018-01-11 ENCOUNTER — Ambulatory Visit (INDEPENDENT_AMBULATORY_CARE_PROVIDER_SITE_OTHER): Payer: Medicare HMO | Admitting: Obstetrics & Gynecology

## 2018-01-11 ENCOUNTER — Ambulatory Visit: Payer: Medicare HMO

## 2018-01-11 ENCOUNTER — Encounter: Payer: Self-pay | Admitting: Obstetrics & Gynecology

## 2018-01-11 VITALS — BP 165/74 | HR 70 | Ht 62.0 in | Wt 189.1 lb

## 2018-01-11 DIAGNOSIS — N3281 Overactive bladder: Secondary | ICD-10-CM

## 2018-01-11 DIAGNOSIS — Z01419 Encounter for gynecological examination (general) (routine) without abnormal findings: Secondary | ICD-10-CM | POA: Insufficient documentation

## 2018-01-11 NOTE — Progress Notes (Signed)
Subjective:     Cynthia Montgomery is a 67 y.o. female here for a routine exam. G2P2 SVD x 2.  LMP >20 years prev.  Current complaints: pt did not take her meds because she thought she was supposed to be fasting. Pt reports that she 'goes the bathroom frequently' she drinks a large cup of coffee every day. She denies leakage of urine. She has had her flu shot.    Gynecologic History No LMP recorded. Patient is postmenopausal. Contraception: post menopausal status Last Pap: maybe 2 years prev. Pt denies h/o abnormal PAP  Last mammogram: 12/28/2017. Results were: normal  Obstetric History OB History  Gravida Para Term Preterm AB Living  2 2 2     2   SAB TAB Ectopic Multiple Live Births          2    # Outcome Date GA Lbr Len/2nd Weight Sex Delivery Anes PTL Lv  2 Term 1972 [redacted]w[redacted]d   F Vag-Spont EPI  LIV  1 Term 27 [redacted]w[redacted]d   F Vag-Spont EPI  LIV   The following portions of the patient's history were reviewed and updated as appropriate: allergies, current medications, past family history, past medical history, past social history, past surgical history and problem list.  Review of Systems Pertinent items are noted in HPI.    Objective:  BP (!) 165/74   Pulse 70   Ht 5\' 2"  (1.575 m)   Wt 189 lb 1.3 oz (85.8 kg)   BMI 34.58 kg/m   General Appearance:    Alert, cooperative, no distress, appears stated age  Head:    Normocephalic, without obvious abnormality, atraumatic  Eyes:    conjunctiva/corneas clear, EOM's intact, both eyes  Ears:    Normal external ear canals, both ears  Nose:   Nares normal, septum midline, mucosa normal, no drainage    or sinus tenderness  Throat:   Lips, mucosa, and tongue normal; teeth and gums normal  Neck:   Supple, symmetrical, trachea midline, no adenopathy;    thyroid:  no enlargement/tenderness/nodules  Back:     Symmetric, no curvature, ROM normal, no CVA tenderness  Lungs:     Clear to auscultation bilaterally, respirations unlabored  Chest Wall:    No  tenderness or deformity   Heart:    Regular rate and rhythm, S1 and S2 normal, no murmur, rub   or gallop  Breast Exam:    No tenderness, masses, or nipple abnormality  Abdomen:     Soft, non-tender, bowel sounds active all four quadrants,    no masses, no organomegaly  Genitalia:    Normal female without lesion, discharge or tenderness     Extremities:   Extremities normal, atraumatic, no cyanosis or edema  Pulses:   2+ and symmetric all extremities  Skin:   Skin color, texture, turgor normal, no rashes or lesions    Assessment:    Healthy female exam.   Overactive bladder- pt drinks large cup of coffee in the am Elevated BP- h/o chronic HTN did not take meds this am      Plan:    Follow up in: 1 year.   for annual  F/u in 31mo to recheck bladder sx Pt will stop all caffeine and monitor sx.  F/u PAP and hrHPV Pt to go home now and take BP meds  Nelia Rogoff L. Harraway-Smith, M.D., [place]

## 2018-01-11 NOTE — Addendum Note (Signed)
Addended by: Lorelle Gibbs L on: 01/11/2018 10:04 AM   Modules accepted: Orders

## 2018-01-11 NOTE — Patient Instructions (Signed)

## 2018-01-13 LAB — CYTOLOGY - PAP
DIAGNOSIS: NEGATIVE
HPV (WINDOPATH): DETECTED — AB

## 2018-01-19 ENCOUNTER — Telehealth: Payer: Self-pay

## 2018-01-19 NOTE — Telephone Encounter (Signed)
-----   Message from Willodean Rosenthal, MD sent at 01/19/2018  9:51 AM EDT ----- Pleaae call pt. 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   Thx, Cl[facility]-S

## 2018-01-19 NOTE — Telephone Encounter (Signed)
Called pt with results. Discussed HPV and the need for a repeat pap in 1 year. Understanding was voiced.

## 2018-03-05 ENCOUNTER — Other Ambulatory Visit: Payer: Self-pay | Admitting: Family Medicine

## 2018-03-05 MED ORDER — AMLODIPINE BESYLATE 5 MG PO TABS: 5.0000 mg | ORAL_TABLET | Freq: Every day | ORAL | 0 refills | Status: DC

## 2018-03-05 MED ORDER — LOSARTAN POTASSIUM 25 MG PO TABS: 25.0000 mg | ORAL_TABLET | Freq: Every day | ORAL | 0 refills | Status: DC

## 2018-03-05 NOTE — Telephone Encounter (Signed)
30 day courtesy refill given. Pt is due for f/u appt

## 2018-03-05 NOTE — Telephone Encounter (Signed)
Copied from CRM [id number]. Topic: Quick Communication - Rx Refill/Question >> Mar 05, 2018 11:18 AM Maia Pettiesrtiz, Kristie S wrote: Medication: baclofen (LIORESAL) 10 MG tablet takes 2/day - pt out amLODipine (NORVASC) 5 MG tablet takes 1/day - pt out losartan (COZAAR) 25 MG tablet takes 1/day - pt out lovastatin (MEVACOR) 40 MG tablet takes 1 at bedtime - pt out  Has the patient contacted their pharmacy? No - advised to call pharmacy to request in the future even when she has no refills Preferred Pharmacy (with phone number or street name): [facility] [address] - [place], [place] - [address], [address] [phone] (Phone) [phone] (Fax)

## 2018-03-05 NOTE — Telephone Encounter (Signed)
Requested medication (s) are due for refill today: yes  Requested medication (s) are on the active medication list: yes  Last refill:  Baclofen last filled by historical provider  Future visit scheduled: No  Notes to clinic:  Unable to refill per protocol. No recent lipid panel noted since 2011    Requested Prescriptions  Pending Prescriptions Disp Refills   baclofen (LIORESAL) 10 MG tablet 30 each 0    Sig: Take 1 tablet (10 mg total) by mouth 2 (two) times daily.     Not Delegated - Analgesics:  Muscle Relaxants Failed - 03/05/2018 11:41 AM      Failed - This refill cannot be delegated      Passed - Valid encounter within last 6 months    Recent Outpatient Visits          5 months ago Acute streptococcal pharyngitis   Holiday representative at [facility] [place], [place], [place]   5 months ago Infectious mononucleosis without complication, infectious mononucleosis due to unspecified organism   Holiday representative at [facility], [place], PA-C   7 months ago Nonintractable episodic headache, unspecified headache type   Holiday representative at [facility], [place], [place]            lovastatin (MEVACOR) 40 MG tablet 30 tablet 5    Sig: Take 1 tablet (40 mg total) by mouth at bedtime.     Cardiovascular:  Antilipid - Statins Failed - 03/05/2018 11:41 AM      Failed - Total Cholesterol in normal range and within 360 days    Cholesterol  Date Value Ref Range Status  11/02/2009 226 (H) 0 - 200 mg/dL Final    Comment:    See lab report for associated comment(s)         Failed - LDL in normal range and within 360 days    LDL Cholesterol  Date Value Ref Range Status  11/02/2009 144 (H) 0 - 99 mg/dL Final    Comment:    See lab report for associated comment(s)         Failed - HDL in normal range and within 360 days    HDL  Date Value Ref Range Status  11/02/2009 73 >39 mg/dL Final        Failed - Triglycerides in normal range and within 360 days    Triglycerides  Date Value Ref Range Status  11/02/2009 45 <150 mg/dL Final         Passed - Patient is not pregnant      Passed - Valid encounter within last 12 months    Recent Outpatient Visits          5 months ago Acute streptococcal pharyngitis   Holiday representative at [facility], [place], [place]   5 months ago Infectious mononucleosis without complication, infectious mononucleosis due to unspecified organism   Holiday representative at [employer] [facility], [place], PA-C   7 months ago Nonintractable episodic headache, unspecified headache type   Holiday representative at [facility], [place], [place]           Signed Prescriptions Disp Refills   amLODipine (NORVASC) 5 MG tablet 30 tablet 0    Sig: Take 1 tablet (5 mg total) by mouth daily.     Cardiovascular:  Calcium Channel Blockers Failed - 03/05/2018 11:41 AM  Failed - Last BP in normal range    BP Readings from Last 1 Encounters:  01/11/18 (!) 165/74         Passed - Valid encounter within last 6 months    Recent Outpatient Visits          5 months ago [facility] ph[facility]itis   [facility] [place] [place]   5 months ago Infectious mononucleosis without complication, infectious mononucleosis due to unspecified organism   [facility] [place] [place], [place]   7 months ago Nonintractable episodic headache, unspecified headache type   [facility] [place], [place], [place]            losartan (COZAAR) 25 MG tablet 30 tablet 0    Sig: Take 1 tablet (25 mg total) by mouth daily.     Cardiovascular:  Angiotensin Receptor Blockers Failed - 03/05/2018 11:41 AM      Failed - Last BP in normal range    BP Readings from Last 1  Encounters:  01/11/18 (!) 165/74         Passed - Cr in normal range and within 180 days    Creatinine, Ser  Date Value Ref Range Status  10/05/2017 0.94 0.40 - 1.20 mg/dL Final         Passed - K in normal range and within 180 days    Potassium  Date Value Ref Range Status  10/05/2017 4.1 3.5 - 5.1 mEq/L Final         Passed - Patient is not pregnant      Passed - Valid encounter within last 6 months    Recent Outpatient Visits          5 months ago Acute streptococcal pharyngitis   [facility] [place], [place], [place]   5 months ago Infectious mononucleosis without complication, infectious mononucleosis due to unspecified organism   [facility] [place], [place], [place]   7 months ago Nonintractable episodic headache, unspecified headache type   [facility] [place], [place], [place]

## 2018-03-08 MED ORDER — BACLOFEN 10 MG PO TABS: 10.0000 mg | ORAL_TABLET | Freq: Two times a day (BID) | ORAL | 0 refills | Status: DC

## 2018-03-08 MED ORDER — LOVASTATIN 40 MG PO TABS: 40.0000 mg | ORAL_TABLET | Freq: Every day | ORAL | 5 refills | Status: DC

## 2018-04-07 ENCOUNTER — Encounter: Payer: Self-pay | Admitting: Medical

## 2018-04-07 ENCOUNTER — Ambulatory Visit (INDEPENDENT_AMBULATORY_CARE_PROVIDER_SITE_OTHER): Payer: Medicare HMO | Admitting: Medical

## 2018-04-07 VITALS — BP 156/65 | HR 66 | Temp 98.3°F | Resp 16 | Ht 60.0 in | Wt 188.6 lb

## 2018-04-07 DIAGNOSIS — J40 Bronchitis, not specified as acute or chronic: Secondary | ICD-10-CM | POA: Diagnosis not present

## 2018-04-07 DIAGNOSIS — R05 Cough: Secondary | ICD-10-CM

## 2018-04-07 DIAGNOSIS — R059 Cough, unspecified: Secondary | ICD-10-CM

## 2018-04-07 MED ORDER — BENZONATATE 100 MG PO CAPS: 100.0000 mg | ORAL_CAPSULE | Freq: Three times a day (TID) | ORAL | 0 refills | Status: DC | PRN

## 2018-04-07 MED ORDER — DOXYCYCLINE HYCLATE 100 MG PO TABS: 100.0000 mg | ORAL_TABLET | Freq: Two times a day (BID) | ORAL | 0 refills | Status: DC

## 2018-04-07 MED ORDER — ALBUTEROL SULFATE HFA 108 (90 BASE) MCG/ACT IN AERS: 2.0000 | INHALATION_SPRAY | Freq: Four times a day (QID) | RESPIRATORY_TRACT | 2 refills | Status: DC | PRN

## 2018-04-07 MED ORDER — FLUTICASONE PROPIONATE 50 MCG/ACT NA SUSP: 2.0000 | Freq: Every day | NASAL | 2 refills | Status: DC

## 2018-04-07 NOTE — Patient Instructions (Addendum)
You appear to have bronchitis and sinusitis. Rest hydrate and tylenol for fever. I am prescribing cough medicine benzonatate, and doxycycline antibiotic. For your nasal congestion rx flonase.   You should gradually get better. If not then notify us and would recommend a chest xray.  For wheezing making albuterol available to use if needed  Follow up in 7-10 days or as needed

## 2018-04-07 NOTE — Progress Notes (Signed)
Subjective:    Patient ID: Cynthia Montgomery, female    DOB: 1951/03/07, 68 y.o.   MRN: [id number]  HPI  Pt in for for recent chest congestion for 2 days. She states feels slight rattle in upper chest but can't cough up. She does feel fatigued. Decreased appetite. Mild chills but no fever.   Pt clarifies that will cough up mucus intermittently.  No body aches. Pt had flu vaccine. Pt husband sick with some chest congestion. He has not gotten evaluated yet.  Prior smoker stopped 30 years ago.  Review of Systems  Constitutional: Positive for chills and fatigue. Negative for diaphoresis and fever.  Respiratory: Positive for cough and wheezing. Negative for chest tightness and shortness of breath.        States mild brief wheeze at night when lying supine.  Cardiovascular: Negative for chest pain and palpitations.  Gastrointestinal: Negative for abdominal distention and abdominal pain.  Musculoskeletal: Negative for myalgias.  Skin: Negative for rash.  Neurological: Negative for dizziness and headaches.  Hematological: Negative for adenopathy. Does not bruise/bleed easily.  Psychiatric/Behavioral: Negative for behavioral problems and confusion.    Past Medical History:  Diagnosis Date  . Arthritis   . Cataract   . Colon polyps   . High cholesterol   . Hypertension   . Positive TB test      Social History   Socioeconomic History  . Marital status: Legally Separated    Spouse name: Not on file  . Number of children: Not on file  . Years of education: Not on file  . Highest education level: Not on file  Occupational History  . Not on file  Social Needs  . Financial resource strain: Not on file  . Food insecurity:    Worry: Not on file    Inability: Not on file  . Transportation needs:    Medical: Not on file    Non-medical: Not on file  Tobacco Use  . Smoking status: Never Smoker  . Smokeless tobacco: Never Used  Substance and Sexual Activity  . Alcohol use: No  .  Drug use: No  . Sexual activity: Yes    Birth control/protection: None  Lifestyle  . Physical activity:    Days per week: Not on file    Minutes per session: Not on file  . Stress: Not on file  Relationships  . Social connections:    Talks on phone: Not on file    Gets together: Not on file    Attends religious service: Not on file    Active member of club or organization: Not on file    Attends meetings of clubs or organizations: Not on file    Relationship status: Not on file  . Intimate partner violence:    Fear of current or ex partner: Not on file    Emotionally abused: Not on file    Physically abused: Not on file    Forced sexual activity: Not on file  Other Topics Concern  . Not on file  Social History Narrative  . Not on file    Past Surgical History:  Procedure Laterality Date  . CATARACT EXTRACTION, BILATERAL  2018  . KNEE SURGERY    . TUBAL LIGATION      Family History  Problem Relation Age of Onset  . Arthritis Mother   . Hypertension Mother   . Cancer Father   . Diabetes Sister   . Hypertension Sister   . Arthritis Daughter   .  Asthma Daughter   . Kidney disease Daughter     No Known Allergies  Current Outpatient Medications on File Prior to Visit  Medication Sig Dispense Refill  . amLODipine (NORVASC) 5 MG tablet Take 1 tablet (5 mg total) by mouth daily. 30 tablet 0  . baclofen (LIORESAL) 10 MG tablet Take 1 tablet (10 mg total) by mouth 2 (two) times daily. 30 each 0  . cholecalciferol (VITAMIN D) 400 units TABS tablet Take 400 Units by mouth daily.    Marland Kitchen losartan (COZAAR) 25 MG tablet Take 1 tablet (25 mg total) by mouth daily. 30 tablet 0  . lovastatin (MEVACOR) 40 MG tablet Take 1 tablet (40 mg total) by mouth at bedtime. 30 tablet 5  . Multiple Vitamin (MULTIVITAMIN) tablet Take 1 tablet by mouth daily.     No current facility-administered medications on file prior to visit.     BP (!) 156/65   Pulse 66   Temp 98.3 F (36.8 C) (Oral)    Resp 16   Ht 5' (1.524 m)   Wt 188 lb 9.6 oz (85.5 kg)   SpO2 100%   BMI 36.83 kg/m       Objective:   Physical Exam  General  Mental Status - Alert. General Appearance - Well groomed. Not in acute distress.  Skin Rashes- No Rashes.  HEENT Head- Normal. Ear Auditory Canal - Left- Normal. Right - Normal.Tympanic Membrane- Left- Normal. Right- Normal. Eye Sclera/Conjunctiva- Left- Normal. Right- Normal. Nose & Sinuses Nasal Mucosa- Left-  Boggy and Congested. Right-  Boggy and  Congested.Bilateral maxillary and frontal sinus pressure. Mouth & Throat Lips: Upper Lip- Normal: no dryness, cracking, pallor, cyanosis, or vesicular eruption. Lower Lip-Normal: no dryness, cracking, pallor, cyanosis or vesicular eruption. Buccal Mucosa- Bilateral- No Aphthous ulcers. Oropharynx- No Discharge or Erythema. Tonsils: Characteristics- Bilateral- No Erythema or Congestion. Size/Enlargement- Bilateral- No enlargement. Discharge- bilateral-None.  Neck Neck- Supple. No Masses.   Chest and Lung Exam Auscultation: Breath Sounds:- even and unlabored. Faint transient wheeze upper lobes.  Cardiovascular Auscultation:Rythm- Regular, rate and rhythm. Murmurs & Other Heart Sounds:Ausculatation of the heart reveal- No Murmurs.  Lymphatic Head & Neck General Head & Neck Lymphatics: Bilateral: Description- No Localized lymphadenopathy.       Assessment & Plan:   You appear to have bronchitis and sinusitis. Rest hydrate and tylenol for fever. I am prescribing cough medicine benzonatate, and doxycycline antibiotic. For your nasal congestion rx flonase.   You should gradually get better. If not then notify us and would recommend a chest xray.  For wheezing making albuterol available to use if needed  Follow up in 7-10 days or as needed  [employer], PA-C    [employer], PA-C

## 2018-07-06 ENCOUNTER — Ambulatory Visit: Payer: Self-pay | Admitting: *Deleted

## 2018-07-06 NOTE — Telephone Encounter (Signed)
Patient states she was on the treadmill this morning and she felt pain in chest on left.Call to office- due to chest pain complaint- even though it has been going on for prolonged period of time- patient [facility] going to [facility] Instructed patient UC at this time and follow up for virtual visit after she has been cleared of cardiac problem. Patient is agreeable to plan. Note sent to office for review.  Reason for Disposition . [1] Chest pain lasting <= 5 minutes AND [2] NO chest pain or cardiac symptoms now(Exceptions: pains lasting a few seconds)    Patient states she has been having sharp pains that come and go for 2-3 months now- no pattern- lasting only seconds. She is calling because it happened this morning. Call to office - advised UC for evaluation- patient informed and she will follow advisement.  Answer Assessment - Initial Assessment Questions 1. LOCATION: "Where does it hurt?"       Left above breast 2. RADIATION: "Does the pain go anywhere else?" (e.g., into neck, jaw, arms, back)     no 3. ONSET: "When did the chest pain begin?" (Minutes, hours or days)      Off/on 3-4 months-"twitching" and then goes away 4. PATTERN "Does the pain come and go, or has it been constant since it started?"  "Does it get worse with exertion?"      Comes/goes, happens more when active 5. DURATION: "How long does it last" (e.g., seconds, minutes, hours)     seconds 6. SEVERITY: "How bad is the pain?"  (e.g., Scale 1-10; mild, moderate, or severe)    - MILD (1-3): doesn't interfere with normal activities     - MODERATE (4-7): interferes with normal activities or awakens from sleep    - SEVERE (8-10): excruciating pain, unable to do any normal activities       5-6 7. CARDIAC RISK FACTORS: "Do you have any history of heart problems or risk factors for heart disease?" (e.g., prior heart attack, angina; high blood pressure, diabetes, being overweight, high  cholesterol, smoking, or strong family history of heart disease)     High blood pressure, high cholesterol, over weight 8. PULMONARY RISK FACTORS: "Do you have any history of lung disease?"  (e.g., blood clots in lung, asthma, emphysema, birth control pills)     no 9. CAUSE: "What do you think is causing the chest pain?"     unknown 10. OTHER SYMPTOMS: "Do you have any other symptoms?" (e.g., dizziness, nausea, vomiting, sweating, fever, difficulty breathing, cough)       Headaches at times 11. PREGNANCY: "Is there any chance you are pregnant?" "When was your last menstrual period?"       n/a  Protocols used: CHEST PAIN-A-AH

## 2018-07-16 ENCOUNTER — Telehealth: Payer: Self-pay | Admitting: Family Medicine

## 2018-07-16 NOTE — Telephone Encounter (Signed)
I have not seen anything from [facility] cardiology, so at this time it is difficult to advise her on how to proceed outside of what her cardiologist told her.

## 2018-07-16 NOTE — Telephone Encounter (Signed)
Copied from CRM [id number]. Topic: Quick Communication - See Telephone Encounter >> Jul 16, 2018  2:35 PM Maia Petties wrote: CRM for notification. See Telephone encounter for: 07/16/18. Pt states that she went to UC in [place] last week for a twinge in her chest (as advised by NT). She was then referred to cardiology and had a stress test at [facility]. Pt states it was abnormal and she is asking what she should do? Should she have 2nd opinion? She says she was told she had an abnormal pumping in her heart. She saw Dr. Lorelei Pont. Ph# [phone].Pt asking if Dr. Carmelia Roller got a report and how to proceed.

## 2018-07-20 ENCOUNTER — Telehealth: Payer: Self-pay | Admitting: Family Medicine

## 2018-07-20 ENCOUNTER — Other Ambulatory Visit: Payer: Self-pay

## 2018-07-20 ENCOUNTER — Ambulatory Visit (INDEPENDENT_AMBULATORY_CARE_PROVIDER_SITE_OTHER): Payer: Medicare HMO | Admitting: Family Medicine

## 2018-07-20 ENCOUNTER — Encounter: Payer: Self-pay | Admitting: Family Medicine

## 2018-07-20 DIAGNOSIS — R9439 Abnormal result of other cardiovascular function study: Secondary | ICD-10-CM

## 2018-07-20 NOTE — Progress Notes (Signed)
No chief complaint on file.   Subjective: Patient is a 68 y.o. female here for follow up stress test. Due to outbreak, we are interacting via web portal for an electronic face-to-face visit. I verified patient's ID using 2 identifiers.   Pt followed by [facility]. She had a stress test performed on 4/21. She has not heard the results, but wishes to see a [facility] provider. No recent CP or sob.  ROS: Heart: Denies chest pain  Lungs: Denies SOB   Past Medical History:  Diagnosis Date  . Arthritis   . Cataract   . Colon polyps   . High cholesterol   . Hypertension   . Positive TB test     Objective: No conversational dyspnea Age appropriate judgment and insight Nml affect and mood  Assessment and Plan: Abnormal cardiovascular stress test - Plan: Ambulatory referral to Cardiology  Reviewed [facility] report from 4/21 on Care Everywhere showing anteroseptal defect suggesting ischemia. Will refer to our cards group per her request. We got disconnected and I was unable to get ahold of her again. Will reach out to her with this plan. F/u as originally scheduled.  The patient voiced understanding and agreement to the plan.  Jilda Roche Robinson, DO 07/20/18  2:43 PM

## 2018-07-20 NOTE — Telephone Encounter (Signed)
Copied from CRM [id number]. Topic: Quick Communication - See Telephone Encounter >> Jul 20, 2018  8:57 AM Baldo Daub L wrote: CRM for notification. See Telephone encounter for: 07/20/18.  Pt states she received a letter from [facility] telling her she needed to have myocardiocal spect multiple studies.  Pt would like to know from Dr. Carmelia Roller if this is something that she should have done. Pt can be reached at [phone]

## 2018-07-20 NOTE — Telephone Encounter (Signed)
Virtual visit to discuss  Please advise

## 2018-07-20 NOTE — Telephone Encounter (Signed)
I am not sure what this is. My guess would be she should contact her heart doctor. If she does not currently have one, please schedule E visit with me.

## 2018-07-20 NOTE — Telephone Encounter (Signed)
Scheduled virtual visit today at 2:30

## 2018-07-21 ENCOUNTER — Telehealth: Payer: Self-pay | Admitting: Cardiology

## 2018-07-21 NOTE — Progress Notes (Signed)
Virtual Visit via Video Note   This visit type was conducted due to national recommendations for restrictions regarding the COVID-19 Pandemic (e.g. social distancing) in an effort to limit this patient's exposure and mitigate transmission in our community.  Due to her co-morbid illnesses, this patient is at least at moderate risk for complications without adequate follow up.  This format is felt to be most appropriate for this patient at this time.  All issues noted in this document were discussed and addressed.  A limited physical exam was performed with this format.  Please refer to the patient's chart for her consent to [facility].   Evaluation Performed:  Follow-up visit  Date:  07/21/2018   ID:  Cynthia Chessmannnie Kuhlman, DOB 02/04/1951, MRN [id number]  Patient Location: Home Provider Location: Home  PCP:  Sharlene DoryWendling, Nicholas Paul, DO  Cardiologist:  No primary care provider on file. Dr Dulce SellarMunley Electrophysiologist:  None   Chief Complaint:  Abnormal stress test  History of Present Illness:    Cynthia Montgomery is a 68 y.o. female with hypertension RBBB and hyperlipidemia referred after having an  abnormal myocardial perfusion study 07/13/2018 showing moderate size anterior septal ischemia performed for exertional chest pain referred by  Dr. Carmelia RollerWendling. She had a " mildly abnormal stress test" in 2017.  07/13/18:  Technique: Patient was injected at rest with 10 mCi of technetium 99 Cardiolite and resting images were obtained. After the patient was infused with Regadenosine following standard protocol and then injected with 30 mCi of technetium 6042m Cardiolite and the stress  images are obtained. Gated studies were also performed.  Findings: There is a moderate size anteroapical defect noted suggesting ischemia. Left ventricular systolic function is normal. Left ventricular ejection fraction is 70% EKG negative for ischemia. No arrhythmias noted. CONCLUSIONS:  Moderate size  anteroapical reversible defect suggesting ischemia. Normal LV function   The patient does not have symptoms concerning for COVID-19 infection (fever, chills, cough, or new shortness of breath).   Recently she has had he had a stress of her mother dying and has had some very mild episodes of substernal discomfort with out activity resolving quickly.  About 2 weeks ago she was doing her exercise and as she stopped she had severe substernal aching no radiation she rested and gradually resolved over a few minutes.  It was nonpleuritic she had no diaphoresis nausea or vomiting and fortunately it has not recurred.  She had a myocardial perfusion study that is clearly abnormal with left anterior descending coronary ischemia.  Fortunately her clinical course is stable and she takes aspirin antihypertensives and a low intensity statin.  She has no known history of congenital rheumatic heart disease she has a background history of using an inhaler intermittently but not recently and is not having shortness of breath bronchospasm edema or palpitation.  We discussed management I think the best test to perform is a cardiac CTA and hopefully in the next 4 to 6 weeks we will resume our outpatient program her CAD is stable I asked her to start a selective beta-blocker Bystolic and switched her to a high intensity statin and gave instructions and a prescription for nitroglycerin.  I will see her back in my office in 3 to 4 weeks and be able to make arrangements at that time as a class I patient.  Past Medical History:  Diagnosis Date  . Arthritis   . Cataract   . Colon polyps   . High cholesterol   .  Hypertension   . Positive TB test    Past Surgical History:  Procedure Laterality Date  . CATARACT EXTRACTION, BILATERAL  2018  . KNEE SURGERY    . TUBAL LIGATION       No outpatient medications have been marked as taking for the 07/22/18 encounter (Appointment) with Baldo Daub, MD.     Allergies:    Patient has no known allergies.   Social History   Tobacco Use  . Smoking status: Never Smoker  . Smokeless tobacco: Never Used  Substance Use Topics  . Alcohol use: No  . Drug use: No     Family Hx: The patient's family history includes Arthritis in her daughter and mother; Asthma in her daughter; Cancer in her father; Diabetes in her sister; Hypertension in her mother and sister; Kidney disease in her daughter.  ROS:   Please see the history of present illness.    Review of Systems  Constitution: Negative.  HENT: Negative.   Eyes: Negative.   Cardiovascular: Positive for chest pain.  Respiratory: Negative for sputum production.   Endocrine: Negative.   Hematologic/Lymphatic: Negative.   Skin: Negative.   Musculoskeletal: Negative.   Gastrointestinal: Negative.   Genitourinary: Negative.   Neurological: Negative.   Psychiatric/Behavioral: Negative.   Allergic/Immunologic: Negative.    All other systems reviewed and are negative.   Prior CV studies:   The following studies were reviewed today:  Echo 01/25/17:    Interpretation Summary A complete two-dimensional transthoracic echocardiogram was performed (2D, M-mode, Doppler and color flow Doppler). The left ventricular wall motion is normal. There is mild concentric left ventricular hypertrophy. Left ventricular systolic function is normal. Ejection Fraction = >55%. There is mild tricuspid regurgitation.  Labs/Other Tests and Data Reviewed:    EKG:  07/06/18 from [facility]   Sinus rhythm. 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 Abnormal.      Recent Labs: 09/26/2017: TSH 0.866 10/05/2017: ALT 34; BUN 19; Creatinine, Ser 0.94; Hemoglobin 11.9; Platelets 503.0; Potassium 4.1; Sodium 141   Recent Lipid Panel Lab Results  Component Value Date/Time   CHOL 226 (H) 11/02/2009 08:52 PM   TRIG 45 11/02/2009 08:52 PM   HDL 73 11/02/2009 08:52 PM   CHOLHDL 3.1 Ratio 11/02/2009 08:52  PM   LDLCALC 144 (H) 11/02/2009 08:52 PM    Wt Readings from Last 3 Encounters:  04/07/18 188 lb 9.6 oz (85.5 kg)  01/11/18 189 lb 1.3 oz (85.8 kg)  12/14/17 189 lb (85.7 kg)     Objective:    Vital Signs:  There were no vitals taken for this visit.   VITAL SIGNS:  reviewed GEN:  no acute distress EYES:  sclerae anicteric, EOMI - Extraocular Movements Intact RESPIRATORY:  normal respiratory effort, symmetric expansion CARDIOVASCULAR:  no peripheral edema SKIN:  no rash, lesions or ulcers. MUSCULOSKELETAL:  no obvious deformities. NEURO:  alert and oriented x 3, no obvious focal deficit PSYCH:  normal affect  ASSESSMENT & PLAN:    1. 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  We will intensify medical therapy as described and provided her symptoms remain stable we will see back in the office in a few weeks and make arrangements for cardiac CTA as she has severe flow-limiting stenosis should benefit from revascularization.  She has good healthcare literacy and understands the importance of follow-up and medications. 2. Abnormal myocardial perfusion study unfortunately with COVID-19 it  was done pharmacologically to avoid aerosol spread on a treadmill much of the functional data is lost and I think a cardiac CTA with answer the question of the best way to manage this patient with stable CAD 3. Stable EKG pattern 4. The addition of beta-blocker should achieve blood pressure goal 1 30-1 40 systolic continue her calcium channel blocker 5. Switch to high intensity statin she will need a follow-up lipid profile when seen by me in the office and also one-time screening for LP(a)  COVID-19 Education: The signs and symptoms of COVID-19 were discussed with the patient and how to seek care for testing (follow up with PCP or arrange E-visit).  The importance of social distancing was discussed today.  Time:   Today, I  have spent 60 including the time reviewing her records including previous stress test echocardiogram with normal left ventricular function myocardial perfusion study with abnormality and office notes from her primary care physician prior to the visit and discussing the results of cardiac testing with the patient and formulating a treatment plan minutes with the patient with telehealth technology discussing the above problems.     Medication Adjustments/Labs and Tests Ordered: Current medicines are reviewed at length with the patient today.  Concerns regarding medicines are outlined above.   Tests Ordered: No orders of the defined types were placed in this encounter.   Medication Changes: No orders of the defined types were placed in this encounter.   Disposition:  Follow up in 4 week(s)  Signed, Norman Herrlich, MD  07/21/2018 2:13 PM    [facility]

## 2018-07-21 NOTE — Telephone Encounter (Signed)
Virtual Visit Pre-Appointment Phone Call  "(Name), I am calling you today to discuss your upcoming appointment. We are currently trying to limit exposure to the virus that causes COVID-19 by seeing patients at home rather than in the office."  1. "What is the BEST phone number to call the day of the visit?" - include this in appointment notes  2. Do you have or have access to (through a family member/friend) a smartphone with video capability that we can use for your visit?" a. If yes - list this number in appt notes as cell (if different from BEST phone #) and list the appointment type as a VIDEO visit in appointment notes b. If no - list the appointment type as a PHONE visit in appointment notes  3. Confirm consent - "In the setting of the current Covid19 crisis, you are scheduled for a (phone or video) visit with your provider on (date) at (time).  Just as we do with many in-office visits, in order for you to participate in this visit, we must obtain consent.  If you'd like, I can send this to your mychart (if signed up) or email for you to review.  Otherwise, I can obtain your verbal consent now.  All virtual visits are billed to your insurance company just like a normal visit would be.  By agreeing to a virtual visit, we'd like you to understand that the technology does not allow for your provider to perform an examination, and thus may limit your provider's ability to fully assess your condition. If your provider identifies any concerns that need to be evaluated in person, we will make arrangements to do so.  Finally, though the technology is pretty good, we cannot assure that it will always work on either your or our end, and in the setting of a video visit, we may have to convert it to a phone-only visit.  In either situation, we cannot ensure that we have a secure connection.  Are you willing to proceed?" STAFF: Did the patient verbally acknowledge consent to telehealth visit? Document  YES/NO here:YES  4. Advise patient to be prepared - "Two hours prior to your appointment, go ahead and check your blood pressure, pulse, oxygen saturation, and your weight (if you have the equipment to check those) and write them all down. When your visit starts, your provider will ask you for this information. If you have an Apple Watch or Kardia device, please plan to have heart rate information ready on the day of your appointment. Please have a pen and paper handy nearby the day of the visit as well."  5. Give patient instructions for MyChart download to smartphone OR Doximity/Doxy.me as below if video visit (depending on what platform provider is using)  6. Inform patient they will receive a phone call 15 minutes prior to their appointment time (may be from unknown caller ID) so they should be prepared to answer    TELEPHONE CALL NOTE  Cynthia Montgomery has been deemed a candidate for a follow-up tele-health visit to limit community exposure during the Covid-19 pandemic. I spoke with the patient via phone to ensure availability of phone/video source, confirm preferred email & phone number, and discuss instructions and expectations.  I reminded Cynthia Montgomery to be prepared with any vital sign and/or heart rhythm information that could potentially be obtained via home monitoring, at the time of her visit. I reminded Cynthia Montgomery to expect a phone call prior to her visit.  Cynthia Montgomery  Cynthia Montgomery Welch 07/21/2018 8:25 AM   INSTRUCTIONS FOR DOWNLOADING THE MYCHART APP TO SMARTPHONE  - The patient must first make sure to have activated MyChart and know their login information - If Apple, go to [employer] Store and type in MyChart in the search bar and download the app. If Android, ask patient to go to [employer] Play Store and type in [place] in the search bar and download the app. The app is free but as with any other app downloads, their phone may require them to verify saved payment information or Apple/Android  password.  - The patient will need to then log into the app with their MyChart username and password, and select [place] as their healthcare provider to link the account. When it is time for your visit, go to the MyChart app, find appointments, and click Begin Video Visit. Be sure to Select Allow for your device to access the Microphone and Camera for your visit. You will then be connected, and your provider will be with you shortly.  **If they have any issues connecting, or need assistance please contact MyChart service desk [phone])**  **If using a computer, in order to ensure the best quality for their visit they will need to use either of the following Internet Browsers: [employer], or Google Chrome**  IF USING DOXIMITY or DOXY.ME - The patient will receive a link just prior to their visit by text.     FULL LENGTH CONSENT FOR TELE-HEALTH VISIT   I hereby voluntarily request, consent and authorize [facility] and its employed or contracted physicians, physician assistants, nurse practitioners or other licensed health care professionals (the Practitioner), to provide me with telemedicine health care services (the Services") as deemed necessary by the treating Practitioner. I acknowledge and consent to receive the Services by the Practitioner via telemedicine. I understand that the telemedicine visit will involve communicating with the Practitioner through live audiovisual communication technology and the disclosure of certain medical information by electronic transmission. I acknowledge that I have been given the opportunity to request an in-person assessment or other available alternative prior to the telemedicine visit and am voluntarily participating in the telemedicine visit.  I understand that I have the right to withhold or withdraw my consent to the use of telemedicine in the course of my care at any time, without affecting my right to future care or treatment,  and that the Practitioner or I may terminate the telemedicine visit at any time. I understand that I have the right to inspect all information obtained and/or recorded in the course of the telemedicine visit and may receive copies of available information for a reasonable fee.  I understand that some of the potential risks of receiving the Services via telemedicine include:   Delay or interruption in medical evaluation due to technological equipment failure or disruption;  Information transmitted may not be sufficient (e.g. poor resolution of images) to allow for appropriate medical decision making by the Practitioner; and/or   In rare instances, security protocols could fail, causing a breach of personal health information.  Furthermore, I acknowledge that it is my responsibility to provide information about my medical history, conditions and care that is complete and accurate to the best of my ability. I acknowledge that Practitioner's advice, recommendations, and/or decision may be based on factors not within their control, such as incomplete or inaccurate data provided by me or distortions of diagnostic images or specimens that may result from electronic transmissions. I understand that the practice  of medicine is not an Chief Strategy Officer and that Practitioner makes no warranties or guarantees regarding treatment outcomes. I acknowledge that I will receive a copy of this consent concurrently upon execution via email to the email address I last provided but may also request a printed copy by calling the office of [place].    I understand that my insurance will be billed for this visit.   I have read or had this consent read to me.  I understand the contents of this consent, which adequately explains the benefits and risks of the Services being provided via telemedicine.   I have been provided ample opportunity to ask questions regarding this consent and the Services and have had my questions  answered to my satisfaction.  I give my informed consent for the services to be provided through the use of telemedicine in my medical care  By participating in this telemedicine visit I agree to the above.

## 2018-07-22 ENCOUNTER — Telehealth (INDEPENDENT_AMBULATORY_CARE_PROVIDER_SITE_OTHER): Payer: Medicare HMO | Admitting: Cardiology

## 2018-07-22 ENCOUNTER — Encounter: Payer: Self-pay | Admitting: Cardiology

## 2018-07-22 ENCOUNTER — Other Ambulatory Visit: Payer: Self-pay

## 2018-07-22 VITALS — BP 141/75 | HR 89 | Ht 62.0 in | Wt 196.4 lb

## 2018-07-22 DIAGNOSIS — R9439 Abnormal result of other cardiovascular function study: Secondary | ICD-10-CM | POA: Insufficient documentation

## 2018-07-22 DIAGNOSIS — E785 Hyperlipidemia, unspecified: Secondary | ICD-10-CM

## 2018-07-22 DIAGNOSIS — I10 Essential (primary) hypertension: Secondary | ICD-10-CM

## 2018-07-22 DIAGNOSIS — R079 Chest pain, unspecified: Secondary | ICD-10-CM

## 2018-07-22 DIAGNOSIS — I4510 Unspecified right bundle-branch block: Secondary | ICD-10-CM | POA: Insufficient documentation

## 2018-07-22 HISTORY — DX: Unspecified right bundle-branch block: I45.10

## 2018-07-22 HISTORY — DX: Abnormal result of other cardiovascular function study: R94.39

## 2018-07-22 HISTORY — DX: Chest pain, unspecified: R07.9

## 2018-07-22 MED ORDER — NEBIVOLOL HCL 5 MG PO TABS: 5.0000 mg | ORAL_TABLET | Freq: Every day | ORAL | 3 refills | Status: DC

## 2018-07-22 MED ORDER — ROSUVASTATIN CALCIUM 10 MG PO TABS: 10.0000 mg | ORAL_TABLET | Freq: Every day | ORAL | 3 refills | Status: DC

## 2018-07-22 MED ORDER — NITROGLYCERIN 0.4 MG SL SUBL: 0.4000 mg | SUBLINGUAL_TABLET | SUBLINGUAL | 3 refills | Status: DC | PRN

## 2018-07-22 NOTE — Patient Instructions (Addendum)
Medication Instructions:  Your physician has recommended you make the following change in your medication:   STOP lovastatin   START rosuvastatin (crestor) 10 mg: Take 1 tablet daily in the evening START nebivolol (bystolic) 5 mg: Take 1 tablet daily START nitroglycerin as needed for chest pain: When having chest pain, stop what you are doing and sit down. Take 1 nitro, wait 5 minutes. Still having chest pain, take 1 nitro, wait 5 minutes. Still having chest pain, take 1 nitro, dial 911. Total of 3 nitro in 15 minutes.   If you need a refill on your cardiac medications before your next appointment, please call your pharmacy.   Lab work: None  If you have labs (blood work) drawn today and your tests are completely normal, you will receive your results only by: Marland Kitchen. MyChart Message (if you have MyChart) OR . A paper copy in the mail If you have any lab test that is abnormal or we need to change your treatment, we will call you to review the results.  Testing/Procedures: None  Follow-Up: At [facility], you and your health needs are our priority.  As part of our continuing mission to provide you with exceptional heart care, we have created designated Provider Care Teams.  These Care Teams include your primary Cardiologist (physician) and Advanced Practice Providers (APPs -  Physician Assistants and Nurse Practitioners) who all work together to provide you with the care you need, when you need it. You will need a follow up virtual appointment in 1 months: Wednesday, 08/11/2018, at 8:30 am.     Rosuvastatin Tablets What is this medicine? ROSUVASTATIN (roe SOO va sta tin) is known as a HMG-CoA reductase inhibitor or 'statin'. It lowers cholesterol and triglycerides in the blood. This drug may also reduce the risk of heart attack, stroke, or other health problems in patients with risk factors for heart disease. Diet and lifestyle changes are often used with this drug. This medicine may be  used for other purposes; ask your health care provider or pharmacist if you have questions. COMMON BRAND NAME(S): Crestor What should I tell my health care provider before I take this medicine? They need to know if you have any of these conditions: -diabetes -if you often drink alcohol -history of stroke -kidney disease -liver disease -muscle aches or weakness -thyroid disease -an unusual or allergic reaction to rosuvastatin, other medicines, foods, dyes, or preservatives -pregnant or trying to get pregnant -breast-feeding How should I use this medicine? Take this medicine by mouth with a glass of water. Follow the directions on the prescription label. Do not cut, crush or chew this medicine. You can take this medicine with or without food. Take your doses at regular intervals. Do not take your medicine more often than directed. Talk to your pediatrician regarding the use of this medicine in children. While this drug may be prescribed for children as young as 68 years old for selected conditions, precautions do apply. Overdosage: If you think you have taken too much of this medicine contact a poison control center or emergency room at once. NOTE: This medicine is only for you. Do not share this medicine with others. What if I miss a dose? If you miss a dose, take it as soon as you can. If your next dose is to be taken in less than 12 hours, then do not take the missed dose. Take the next dose at your regular time. Do not take double or extra doses. What may interact  with this medicine? Do not take this medicine with any of the following medications: -herbal medicines like red yeast rice This medicine may also interact with the following medications: -alcohol -antacids containing aluminum hydroxide or magnesium hydroxide -cyclosporine -other medicines for high cholesterol -some medicines for HIV infection -warfarin This list may not describe all possible interactions. Give your health  care provider a list of all the medicines, herbs, non-prescription drugs, or dietary supplements you use. Also tell them if you smoke, drink alcohol, or use illegal drugs. Some items may interact with your medicine. What should I watch for while using this medicine? Visit your doctor or health care professional for regular check-ups. You may need regular tests to make sure your liver is working properly. Your health care professional may tell you to stop taking this medicine if you develop muscle problems. If your muscle problems do not go away after stopping this medicine, contact your health care professional. Do not become pregnant while taking this medicine. Women should inform their health care professional if they wish to become pregnant or think they might be pregnant. There is a potential for serious side effects to an unborn child. Talk to your health care professional or pharmacist for more information. Do not breast-feed an infant while taking this medicine. This medicine may affect blood sugar levels. If you have diabetes, check with your doctor or health care professional before you change your diet or the dose of your diabetic medicine. If you are going to need surgery or other procedure, tell your doctor that you are using this medicine. This drug is only part of a total heart-health program. Your doctor or a dietician can suggest a low-cholesterol and low-fat diet to help. Avoid alcohol and smoking, and keep a proper exercise schedule. This medicine may cause a decrease in Co-Enzyme Q-10. You should make sure that you get enough Co-Enzyme Q-10 while you are taking this medicine. Discuss the foods you eat and the vitamins you take with your health care professional. What side effects may I notice from receiving this medicine? Side effects that you should report to your doctor or health care professional as soon as possible: -allergic reactions like skin rash, itching or hives, swelling of  the face, lips, or tongue -dark urine -fever -joint pain -muscle cramps, pain -redness, blistering, peeling or loosening of the skin, including inside the mouth -trouble passing urine or change in the amount of urine -unusually weak or tired -yellowing of the eyes or skin Side effects that usually do not require medical attention (report to your doctor or health care professional if they continue or are bothersome): -constipation -heartburn -nausea -stomach gas, pain, upset This list may not describe all possible side effects. Call your doctor for medical advice about side effects. You may report side effects to FDA at 1-800-FDA-1088. Where should I keep my medicine? Keep out of the reach of children. Store at room temperature between 20 and 25 degrees C (68 and 77 degrees F). Keep container tightly closed (protect from moisture). Throw away any unused medicine after the expiration date. NOTE: This sheet is a summary. It may not cover all possible information. If you have questions about this medicine, talk to your doctor, pharmacist, or health care provider.  2019 Elsevier/Gold Standard (2016-11-11 12:42:43)    Nebivolol oral tablets What is this medicine? NEBIVOLOL (ne BIV oh lol) is a beta-blocker. Beta-blockers reduce the workload on the heart and help it to beat more regularly. This medicine is used  to treat high blood pressure. This medicine may be used for other purposes; ask your health care provider or pharmacist if you have questions. COMMON BRAND NAME(S): Bystolic What should I tell my health care provider before I take this medicine? They need to know if you have any of these conditions: -diabetes -heart or vessel disease like slow heartrate, worsening heart failure, heart block, sick sinus syndrome or Raynaud's disease -kidney disease -liver disease -lung disease like asthma or emphysema -pheochromocytoma -thyroid disease -an unusual or allergic reaction to  nebivolol, other beta-blockers, medicines, foods, dyes, or preservatives -pregnant or trying to get pregnant -breast-feeding How should I use this medicine? Take this medicine by mouth with a glass of water. Follow the directions on the prescription label. You can take this medicine with or without food. Take your doses at regular intervals. Do not take your medicine more often than directed. Do not stop taking this medicine suddenly. This could lead to serious heart-related effects. Talk to your pediatrician regarding the use of this medicine in children. Special care may be needed. Overdosage: If you think you have taken too much of this medicine contact a poison control center or emergency room at once. NOTE: This medicine is only for you. Do not share this medicine with others. What if I miss a dose? If you miss a dose, take it as soon as you can. If it is almost time for your next dose, take only that dose. Do not take double or extra doses. What may interact with this medicine? This medicine may interact with the following medications: -certain medicines for blood pressure, heart disease, irregular heart beat -certain medicines for depression, like fluoxetine or paroxetine -cimetidine -clonidine -reserpine -sildenafil This list may not describe all possible interactions. Give your health care provider a list of all the medicines, herbs, non-prescription drugs, or dietary supplements you use. Also tell them if you smoke, drink alcohol, or use illegal drugs. Some items may interact with your medicine. What should I watch for while using this medicine? Visit your doctor or health care professional for regular checks on your progress. Check your heart rate and blood pressure regularly while you are taking this medicine. Ask your doctor or health care professional what your heart rate and blood pressure should be, and when you should contact him or her. You may get drowsy or dizzy. Do not  drive, use machinery, or do anything that needs mental alertness until you know how this drug affects you. Do not stand or sit up quickly, especially if you are an older patient. This reduces the risk of dizzy or fainting spells. Alcohol can make you more drowsy and dizzy. Avoid alcoholic drinks. This medicine can affect blood sugar levels. If you have diabetes, check with your doctor or health care professional before you change your diet or the dose of your diabetic medicine. Do not treat yourself for coughs, colds, or pain while you are taking this medicine without asking your doctor or health care professional for advice. Some ingredients may increase your blood pressure. What side effects may I notice from receiving this medicine? Side effects that you should report to your doctor or health care professional as soon as possible: -allergic reactions like skin rash, itching or hives, swelling of the face, lips, or tongue -breathing problems -chest pain -cold, tingling, or numb hands or feet -dark urine -general ill feeling or flu-like symptoms -irregular heartbeat -light-colored stools -loss of appetite, nausea -right upper belly pain -slow heart rate -  swollen legs or ankles -unusual bleeding or bruising -unusually weak or tired -vomiting -yellowing of the eyes or skin Side effects that usually do not require medical attention (report to your doctor or health care professional if they continue or are bothersome): -diarrhea -dizziness -dry or burning eyes -headache -nausea -tiredness -trouble sleeping This list may not describe all possible side effects. Call your doctor for medical advice about side effects. You may report side effects to FDA at 1-800-FDA-1088. Where should I keep my medicine? Keep out of the reach of children. Store at room temperature between 20 and 25 degrees C (68 and 77 degrees F). Protect from light. Keep container tightly closed. Throw away any unused  medicine after the expiration date. NOTE: This sheet is a summary. It may not cover all possible information. If you have questions about this medicine, talk to your doctor, pharmacist, or health care provider.  2019 Elsevier/Gold Standard (2012-11-12 14:46:00)   Nitroglycerin sublingual tablets What is this medicine? NITROGLYCERIN (nye troe GLI ser in) is a type of vasodilator. It relaxes blood vessels, increasing the blood and oxygen supply to your heart. This medicine is used to relieve chest pain caused by angina. It is also used to prevent chest pain before activities like climbing stairs, going outdoors in cold weather, or sexual activity. This medicine may be used for other purposes; ask your health care provider or pharmacist if you have questions. COMMON BRAND NAME(S): Nitroquick, Nitrostat, Nitrotab What should I tell my health care provider before I take this medicine? They need to know if you have any of these conditions: -anemia -head injury, recent stroke, or bleeding in the brain -liver disease -previous heart attack -an unusual or allergic reaction to nitroglycerin, other medicines, foods, dyes, or preservatives -pregnant or trying to get pregnant -breast-feeding How should I use this medicine? Take this medicine by mouth as needed. At the first sign of an angina attack (chest pain or tightness) place one tablet under your tongue. You can also take this medicine 5 to 10 minutes before an event likely to produce chest pain. Follow the directions on the prescription label. Let the tablet dissolve under the tongue. Do not swallow whole. Replace the dose if you accidentally swallow it. It will help if your mouth is not dry. Saliva around the tablet will help it to dissolve more quickly. Do not eat or drink, smoke or chew tobacco while a tablet is dissolving. If you are not better within 5 minutes after taking ONE dose of nitroglycerin, call 9-1-1 immediately to seek emergency medical  care. Do not take more than 3 nitroglycerin tablets over 15 minutes. If you take this medicine often to relieve symptoms of angina, your doctor or health care professional may provide you with different instructions to manage your symptoms. If symptoms do not go away after following these instructions, it is important to call 9-1-1 immediately. Do not take more than 3 nitroglycerin tablets over 15 minutes. Talk to your pediatrician regarding the use of this medicine in children. Special care may be needed. Overdosage: If you think you have taken too much of this medicine contact a poison control center or emergency room at once. NOTE: This medicine is only for you. Do not share this medicine with others. What if I miss a dose? This does not apply. This medicine is only used as needed. What may interact with this medicine? Do not take this medicine with any of the following medications: -certain migraine medicines like ergotamine and  dihydroergotamine (DHE) -medicines used to treat erectile dysfunction like sildenafil, tadalafil, and vardenafil -riociguat This medicine may also interact with the following medications: -alteplase -aspirin -heparin -medicines for high blood pressure -medicines for mental depression -other medicines used to treat angina -phenothiazines like chlorpromazine, mesoridazine, prochlorperazine, thioridazine This list may not describe all possible interactions. Give your health care provider a list of all the medicines, herbs, non-prescription drugs, or dietary supplements you use. Also tell them if you smoke, drink alcohol, or use illegal drugs. Some items may interact with your medicine. What should I watch for while using this medicine? Tell your doctor or health care professional if you feel your medicine is no longer working. Keep this medicine with you at all times. Sit or lie down when you take your medicine to prevent falling if you feel dizzy or faint after using  it. Try to remain calm. This will help you to feel better faster. If you feel dizzy, take several deep breaths and lie down with your feet propped up, or bend forward with your head resting between your knees. You may get drowsy or dizzy. Do not drive, use machinery, or do anything that needs mental alertness until you know how this drug affects you. Do not stand or sit up quickly, especially if you are an older patient. This reduces the risk of dizzy or fainting spells. Alcohol can make you more drowsy and dizzy. Avoid alcoholic drinks. Do not treat yourself for coughs, colds, or pain while you are taking this medicine without asking your doctor or health care professional for advice. Some ingredients may increase your blood pressure. What side effects may I notice from receiving this medicine? Side effects that you should report to your doctor or health care professional as soon as possible: -blurred vision -dry mouth -skin rash -sweating -the feeling of extreme pressure in the head -unusually weak or tired Side effects that usually do not require medical attention (report to your doctor or health care professional if they continue or are bothersome): -flushing of the face or neck -headache -irregular heartbeat, palpitations -nausea, vomiting This list may not describe all possible side effects. Call your doctor for medical advice about side effects. You may report side effects to FDA at 1-800-FDA-1088. Where should I keep my medicine? Keep out of the reach of children. Store at room temperature between 20 and 25 degrees C (68 and 77 degrees F). Store in Retail buyer. Protect from light and moisture. Keep tightly closed. Throw away any unused medicine after the expiration date. NOTE: This sheet is a summary. It may not cover all possible information. If you have questions about this medicine, talk to your doctor, pharmacist, or health care provider.  2019 Elsevier/Gold Standard  (2013-01-06 17:57:36)

## 2018-07-22 NOTE — Telephone Encounter (Signed)
Spoke with patient she has had the visit with you and she know wants to know when do you want to see her again  Please advise

## 2018-07-23 NOTE — Telephone Encounter (Signed)
Let's do her annual exam in 2 months. If we start seeing patients in person sooner, she can move it up. Not emergent/urgent to have. Ty.

## 2018-07-23 NOTE — Telephone Encounter (Signed)
Schedule annual for September 01, 2018---will call to reschedule if not doing in Person appts at that time The patient verbalized all information

## 2018-08-10 NOTE — Progress Notes (Deleted)
{Choose 1 Note Type (Telehealth Visit or Telephone Visit):[phone]}   Date:  08/11/2018   ID:  Cynthia Montgomery, DOB Jul 23, 1950, MRN [id number]  Patient Location: Home Provider Location: Office  PCP:  Sharlene Dory, DO  Cardiologist:  No primary care provider on file. *** Electrophysiologist:  None   Evaluation Performed:  {Choose Visit [phone]::"Follow-Up Visit"}  Chief Complaint:  ***  History of Present Illness:    Cynthia Montgomery is a 68 y.o. female with hypertension RBBB and hyperlipidemia referred after having an  abnormal myocardial perfusion study 07/13/2018 last seen 07/22/18. She was started on a beta blocker and is awaiting a cardiac CTA.  The patient does not have symptoms concerning for COVID-19 infection (fever, chills, cough, or new shortness of breath).    Past Medical History:  Diagnosis Date  . Arthritis   . Asthma   . Cataract   . Colon polyps   . High cholesterol   . Hypertension   . Positive TB test    Past Surgical History:  Procedure Laterality Date  . CATARACT EXTRACTION, BILATERAL  2018  . KNEE SURGERY    . TUBAL LIGATION       No outpatient medications have been marked as taking for the 08/11/18 encounter (Appointment) with Baldo Daub, MD.     Allergies:   Patient has no known allergies.   Social History   Tobacco Use  . Smoking status: Never Smoker  . Smokeless tobacco: Never Used  Substance Use Topics  . Alcohol use: No  . Drug use: No     Family Hx: The patient's family history includes Arthritis in her daughter and mother; Asthma in her daughter; Cancer in her father; Diabetes in her sister; Hypertension in her mother and sister; Kidney disease in her daughter.  ROS:   Please see the history of present illness.    *** All other systems reviewed and are negative.   Prior CV studies:   The following studies were reviewed today:  ***  Labs/Other Tests and Data Reviewed:    EKG:  {EKG/Telemetry Strips  [phone]}  Recent Labs: 09/26/2017: TSH 0.866 10/05/2017: ALT 34; BUN 19; Creatinine, Ser 0.94; Hemoglobin 11.9; Platelets 503.0; Potassium 4.1; Sodium 141   Recent Lipid Panel Lab Results  Component Value Date/Time   CHOL 226 (H) 11/02/2009 08:52 PM   TRIG 45 11/02/2009 08:52 PM   HDL 73 11/02/2009 08:52 PM   CHOLHDL 3.1 Ratio 11/02/2009 08:52 PM   LDLCALC 144 (H) 11/02/2009 08:52 PM    Wt Readings from Last 3 Encounters:  07/22/18 196 lb 6.4 oz (89.1 kg)  04/07/18 188 lb 9.6 oz (85.5 kg)  01/11/18 189 lb 1.3 oz (85.8 kg)     Objective:    Vital Signs:  There were no vitals taken for this visit.   {[facility] Virtual Exam (Optional):[phone]::"VITAL SIGNS:  reviewed"}  ASSESSMENT & PLAN:    1. ***  COVID-19 Education: The signs and symptoms of COVID-19 were discussed with the patient and how to seek care for testing (follow up with PCP or arrange E-visit).  ***The importance of social distancing was discussed today.  Time:   Today, I have spent *** minutes with the patient with telehealth technology discussing the above problems.     Medication Adjustments/Labs and Tests Ordered: Current medicines are reviewed at length with the patient today.  Concerns regarding medicines are outlined above.   Tests Ordered: No orders of the defined types were placed in this encounter.  Medication Changes: No orders of the defined types were placed in this encounter.   Disposition:  Follow up {follow up:[id number]}  Signed, Norman HerrlichBrian Leighanne Adolph, MD  08/11/2018 8:39 AM    [facility]

## 2018-08-11 ENCOUNTER — Ambulatory Visit: Payer: Medicare HMO | Admitting: Cardiology

## 2018-08-17 ENCOUNTER — Encounter: Payer: Self-pay | Admitting: Cardiology

## 2018-08-17 ENCOUNTER — Ambulatory Visit (INDEPENDENT_AMBULATORY_CARE_PROVIDER_SITE_OTHER): Payer: Medicare HMO | Admitting: Cardiology

## 2018-08-17 ENCOUNTER — Other Ambulatory Visit: Payer: Self-pay

## 2018-08-17 VITALS — BP 162/86 | HR 60 | Temp 97.7°F | Ht 62.0 in | Wt 200.0 lb

## 2018-08-17 DIAGNOSIS — I10 Essential (primary) hypertension: Secondary | ICD-10-CM | POA: Diagnosis not present

## 2018-08-17 DIAGNOSIS — E785 Hyperlipidemia, unspecified: Secondary | ICD-10-CM | POA: Diagnosis not present

## 2018-08-17 DIAGNOSIS — Z01812 Encounter for preprocedural laboratory examination: Secondary | ICD-10-CM

## 2018-08-17 DIAGNOSIS — R9439 Abnormal result of other cardiovascular function study: Secondary | ICD-10-CM

## 2018-08-17 NOTE — Progress Notes (Signed)
Cardiology Office Note:    Date:  08/17/2018   ID:  Cynthia Montgomery, DOB October 25, 1950, MRN [id number]  PCP:  Cynthia Dory, DO  Cardiologist:  Cynthia Herrlich, MD    Referring MD: Cynthia Montgomery*    ASSESSMENT:    1. Abnormal myocardial perfusion study   2. Essential hypertension   3. Hyperlipidemia, unspecified hyperlipidemia type    PLAN:    In order of problems listed above:  1. We will schedule cardiac CTA today continue medical treatment including aspirin beta-blocker and high intensity statin, as she has evidence of significant ischemia flow-limiting by FFR would benefit from revascularization 2. Stable home blood pressures at target continue combined therapy including beta-blocker calcium channel blocker ARB 3. Continue high intensity statin awaiting results of perfusion study   Next appointment: 2 months   Medication Adjustments/Labs and Tests Ordered: Current medicines are reviewed at length with the patient today.  Concerns regarding medicines are outlined above.  No orders of the defined types were placed in this encounter.  No orders of the defined types were placed in this encounter.   Chief Complaint  Patient presents with  . Follow-up    to schedule cardiac CT    History of Present Illness:    Cynthia Montgomery is a 68 y.o. female with a hx of hypertension RBBB and hyperlipidemia referred after having an  abnormal myocardial perfusion study 07/13/2018.   07/13/18:  Technique: Patient was injected at rest with 10 mCi of technetium 99 Cardiolite and resting images were obtained. After the patient was infused with Regadenosine following standard protocol and then injected with 30 mCi of technetium 44m Cardiolite and the stress  images are obtained. Gated studies were also performed.  Findings: There is a moderate size anteroapical defect noted suggesting ischemia. Left ventricular systolic function is normal. Left ventricular ejection fraction  is 70% EKG negative for ischemia. No arrhythmias noted. CONCLUSIONS: Moderate size anteroapical reversible defect suggesting ischemia. Normal LV function   She was  last seen 07/21/20 and is pending cardiac CT.  Compliance with diet, lifestyle and medications: yes  She returns to the office in order to schedule cardiac CT.  Her course is favorable is not having anginal discomfort and tells me her blood pressure runs 1 30-1 40 systolic at home.  No edema shortness of breath palpitation or syncope and will go ahead now that we have resumed outpatient testing and schedule her for cardiac CTA class I. Past Medical History:  Diagnosis Date  . Arthritis   . Asthma   . Cataract   . Colon polyps   . High cholesterol   . Hypertension   . Positive TB test     Past Surgical History:  Procedure Laterality Date  . CATARACT EXTRACTION, BILATERAL  2018  . KNEE SURGERY    . TUBAL LIGATION      Current Medications: Current Meds  Medication Sig  . albuterol (PROVENTIL HFA;VENTOLIN HFA) 108 (90 Base) MCG/ACT inhaler Inhale 2 puffs into the lungs every 6 (six) hours as needed for wheezing or shortness of breath.  Marland Kitchen amLODipine (NORVASC) 5 MG tablet Take 1 tablet (5 mg total) by mouth daily.  Marland Kitchen aspirin EC 81 MG tablet Take 81 mg by mouth daily.  . baclofen (LIORESAL) 10 MG tablet Take 1 tablet (10 mg total) by mouth 2 (two) times daily.  . cholecalciferol (VITAMIN D) 400 units TABS tablet Take 400 Units by mouth daily.  . Coenzyme Q10 (CO Q 10) 100  MG CAPS Take 1 capsule by mouth daily.  . fluticasone (FLONASE) 50 MCG/ACT nasal spray Place 2 sprays into both nostrils daily.  Marland Kitchen losartan (COZAAR) 25 MG tablet Take 1 tablet (25 mg total) by mouth daily.  . Multiple Vitamin (MULTIVITAMIN) tablet Take 1 tablet by mouth daily.  . nebivolol (BYSTOLIC) 5 MG tablet Take 1 tablet (5 mg total) by mouth daily.  . nitroGLYCERIN (NITROSTAT) 0.4 MG SL tablet Place 1 tablet (0.4 mg total) under the tongue every  5 (five) minutes as needed for chest pain.  . Omega-3 Fatty Acids (FISH OIL) 1000 MG CAPS Take 1 capsule by mouth daily.  . rosuvastatin (CRESTOR) 10 MG tablet Take 1 tablet (10 mg total) by mouth daily.     Allergies:   Patient has no known allergies.   Social History   Socioeconomic History  . Marital status: Legally Separated    Spouse name: Not on file  . Number of children: Not on file  . Years of education: Not on file  . Highest education level: Not on file  Occupational History  . Not on file  Social Needs  . Financial resource strain: Not on file  . Food insecurity:    Worry: Not on file    Inability: Not on file  . Transportation needs:    Medical: Not on file    Non-medical: Not on file  Tobacco Use  . Smoking status: Never Smoker  . Smokeless tobacco: Never Used  Substance and Sexual Activity  . Alcohol use: No  . Drug use: No  . Sexual activity: Yes    Birth control/protection: None  Lifestyle  . Physical activity:    Days per week: Not on file    Minutes per session: Not on file  . Stress: Not on file  Relationships  . Social connections:    Talks on phone: Not on file    Gets together: Not on file    Attends religious service: Not on file    Active member of club or organization: Not on file    Attends meetings of clubs or organizations: Not on file    Relationship status: Not on file  Other Topics Concern  . Not on file  Social History Narrative  . Not on file     Family History: The patient's family history includes Arthritis in her daughter and mother; Asthma in her daughter; Cancer in her father; Diabetes in her sister; Hypertension in her mother and sister; Kidney disease in her daughter. ROS:   Please see the history of present illness.    All other systems reviewed and are negative.  EKGs/Labs/Other Studies Reviewed:    The following studies were reviewed today:    Recent Labs: 09/26/2017: TSH 0.866 10/05/2017: ALT 34; BUN 19;  Creatinine, Ser 0.94; Hemoglobin 11.9; Platelets 503.0; Potassium 4.1; Sodium 141  Recent Lipid Panel    Component Value Date/Time   CHOL 226 (H) 11/02/2009 2052   TRIG 45 11/02/2009 2052   HDL 73 11/02/2009 2052   CHOLHDL 3.1 Ratio 11/02/2009 2052   VLDL 9 11/02/2009 2052   LDLCALC 144 (H) 11/02/2009 2052    Physical Exam:    VS:  BP (!) 162/86 (BP Location: Right Arm, Patient Position: Sitting, Cuff Size: Large)   Pulse 60   Temp 97.7 F (36.5 C)   Ht 5\' 2"  (1.575 m)   Wt 200 lb (90.7 kg)   SpO2 95%   BMI 36.58 kg/m  Wt Readings from Last 3 Encounters:  08/17/18 200 lb (90.7 kg)  07/22/18 196 lb 6.4 oz (89.1 kg)  04/07/18 188 lb 9.6 oz (85.5 kg)     GEN:  Well nourished, well developed in no acute distress HEENT: Normal NECK: No JVD; No carotid bruits LYMPHATICS: No lymphadenopathy CARDIAC: RRR, no murmurs, rubs, gallops RESPIRATORY:  Clear to auscultation without rales, wheezing or rhonchi  ABDOMEN: Soft, non-tender, non-distended MUSCULOSKELETAL:  No edema; No deformity  SKIN: Warm and dry NEUROLOGIC:  Alert and oriented x 3 PSYCHIATRIC:  Normal affect    Signed, Cynthia HerrlichBrian Munley, MD  08/17/2018 2:33 PM    [facility]

## 2018-08-17 NOTE — Patient Instructions (Addendum)
Medication Instructions:  Your physician recommends that you continue on your current medications as directed. Please refer to the Current Medication list given to you today.  If you need a refill on your cardiac medications before your next appointment, please call your pharmacy.   Lab work: Your physician recommends that you return for lab work within 3-7 days before your cardiac CTA: BMP. Please return to our [facility] office for lab work, no appointment needed. No need to fast beforehand.   If you have labs (blood work) drawn today and your tests are completely normal, you will receive your results only by: Marland Kitchen MyChart Message (if you have MyChart) OR . A paper copy in the mail If you have any lab test that is abnormal or we need to change your treatment, we will call you to review the results.  Testing/Procedures: Your physician has requested that you have cardiac CT. Cardiac computed tomography (CT) is a painless test that uses an x-ray machine to take clear, detailed pictures of your heart. For further information please visit [email or web address] Please follow instruction sheet as given.   Please arrive at the [facility] main entrance of [facility] at xx:xx AM (30-45 minutes prior to test start time)  [facility] [address] [place], [place] [address] [phone]  Proceed to the [facility] Radiology Department (First Floor).  Please follow these instructions carefully (unless otherwise directed):   On the Night Before the Test: . Be sure to Drink plenty of water. . Do not consume any caffeinated/decaffeinated beverages or chocolate 12 hours prior to your test. . Do not take any antihistamines 12 hours prior to your test.   On the Day of the Test: . Drink plenty of water. Do not drink any water within one hour of the test. . Do not eat any food 4 hours prior to the test. . You may take your regular medications prior to the test.  . Take  nebivolol (Bystolic) two hours prior to test.                 -If HR is greater than 55 BPM and patient is greater than 7 yrs old Lopressor 50 mg x1.         After the Test: . Drink plenty of water. . After receiving IV contrast, you may experience a mild flushed feeling. This is normal. . On occasion, you may experience a mild rash up to 24 hours after the test. This is not dangerous. If this occurs, you can take Benadryl 25 mg and increase your fluid intake. . If you experience trouble breathing, this can be serious. If it is severe call 911 IMMEDIATELY. If it is mild, please call our office.   Follow-Up: At [facility], you and your health needs are our priority.  As part of our continuing mission to provide you with exceptional heart care, we have created designated Provider Care Teams.  These Care Teams include your primary Cardiologist (physician) and Advanced Practice Providers (APPs -  Physician Assistants and Nurse Practitioners) who all work together to provide you with the care you need, when you need it. You will need a virtual follow up appointment in 2 months: Wednesday, 10/13/2018, at 3:00 pm.        Cardiac CT Angiogram  A cardiac CT angiogram is a procedure to look at the heart and the area around the heart. It may be done to help find the cause of chest pains  or other symptoms of heart disease. During this procedure, a large X-ray machine, called a CT scanner, takes detailed pictures of the heart and the surrounding area after a dye (contrast material) has been injected into blood vessels in the area. The procedure is also sometimes called a coronary CT angiogram, coronary artery scanning, or CTA. A cardiac CT angiogram allows the health care provider to see how well blood is flowing to and from the heart. The health care provider will be able to see if there are any problems, such as:  Blockage or narrowing of the coronary arteries in the heart.  Fluid around the  heart.  Signs of weakness or disease in the muscles, valves, and tissues of the heart. Tell a health care provider about:  Any allergies you have. This is especially important if you have had a previous allergic reaction to contrast dye.  All medicines you are taking, including vitamins, herbs, eye drops, creams, and over-the-counter medicines.  Any blood disorders you have.  Any surgeries you have had.  Any medical conditions you have.  Whether you are pregnant or may be pregnant.  Any anxiety disorders, chronic pain, or other conditions you have that may increase your stress or prevent you from lying still. What are the risks? Generally, this is a safe procedure. However, problems may occur, including:  Bleeding.  Infection.  Allergic reactions to medicines or dyes.  Damage to other structures or organs.  Kidney damage from the dye or contrast that is used.  Increased risk of cancer from radiation exposure. This risk is low. Talk with your health care provider about: ? The risks and benefits of testing. ? How you can receive the lowest dose of radiation. What happens before the procedure?  Wear comfortable clothing and remove any jewelry, glasses, dentures, and hearing aids.  Follow instructions from your health care provider about eating and drinking. This may include: ? For 12 hours before the test - avoid caffeine. This includes tea, coffee, soda, energy drinks, and diet pills. Drink plenty of water or other fluids that do not have caffeine in them. Being well-hydrated can prevent complications. ? For 4-6 hours before the test - stop eating and drinking. The contrast dye can cause nausea, but this is less likely if your stomach is empty.  Ask your health care provider about changing or stopping your regular medicines. This is especially important if you are taking diabetes medicines, blood thinners, or medicines to treat erectile dysfunction. What happens during the  procedure?  Hair on your chest may need to be removed so that small sticky patches called electrodes can be placed on your chest. These will transmit information that helps to monitor your heart during the test.  An IV tube will be inserted into one of your veins.  You might be given a medicine to control your heart rate during the test. This will help to ensure that good images are obtained.  You will be asked to lie on an exam table. This table will slide in and out of the CT machine during the procedure.  Contrast dye will be injected into the IV tube. You might feel warm, or you may get a metallic taste in your mouth.  You will be given a medicine (nitroglycerin) to relax (dilate) the arteries in your heart.  The table that you are lying on will move into the CT machine tunnel for the scan.  The person running the machine will give you instructions while the  scans are being done. You may be asked to: ? Keep your arms above your head. ? Hold your breath. ? Stay very still, even if the table is moving.  When the scanning is complete, you will be moved out of the machine.  The IV tube will be removed. The procedure may vary among health care providers and hospitals. What happens after the procedure?  You might feel warm, or you may get a metallic taste in your mouth from the contrast dye.  You may have a headache from the nitroglycerin.  After the procedure, drink water or other fluids to wash (flush) the contrast material out of your body.  Contact a health care provider if you have any symptoms of allergy to the contrast. These symptoms include: ? Shortness of breath. ? Rash or hives. ? A racing heartbeat.  Most people can return to their normal activities right after the procedure. Ask your health care provider what activities are safe for you.  It is up to you to get the results of your procedure. Ask your health care provider, or the department that is doing the  procedure, when your results will be ready. Summary  A cardiac CT angiogram is a procedure to look at the heart and the area around the heart. It may be done to help find the cause of chest pains or other symptoms of heart disease.  During this procedure, a large X-ray machine, called a CT scanner, takes detailed pictures of the heart and the surrounding area after a dye (contrast material) has been injected into blood vessels in the area.  Ask your health care provider about changing or stopping your regular medicines before the procedure. This is especially important if you are taking diabetes medicines, blood thinners, or medicines to treat erectile dysfunction.  After the procedure, drink water or other fluids to wash (flush) the contrast material out of your body. This information is not intended to replace advice given to you by your health care provider. Make sure you discuss any questions you have with your health care provider. Document Released: 02/21/2008 Document Revised: 01/28/2016 Document Reviewed: 01/28/2016 Elsevier Interactive Patient Education  2019 [employer].

## 2018-09-01 ENCOUNTER — Other Ambulatory Visit: Payer: Self-pay

## 2018-09-01 ENCOUNTER — Ambulatory Visit (INDEPENDENT_AMBULATORY_CARE_PROVIDER_SITE_OTHER): Payer: Medicare HMO | Admitting: Family Medicine

## 2018-09-01 ENCOUNTER — Encounter: Payer: Self-pay | Admitting: Family Medicine

## 2018-09-01 VITALS — BP 142/82 | HR 69 | Temp 97.7°F | Ht 62.0 in | Wt 198.0 lb

## 2018-09-01 DIAGNOSIS — Z1159 Encounter for screening for other viral diseases: Secondary | ICD-10-CM

## 2018-09-01 DIAGNOSIS — Z0000 Encounter for general adult medical examination without abnormal findings: Secondary | ICD-10-CM

## 2018-09-01 NOTE — Patient Instructions (Signed)
OK to use Debrox (peroxide) in the ear to loosen up wax. Also recommend using a bulb syringe (for removing boogers from baby's noses) to flush through warm water. Do not use Q-tips as this can impact wax further.  Give [place] 2-3 business days to get the results of your labs back.   Keep the diet clean and stay active.  Let us know if you need anything.

## 2018-09-01 NOTE — Progress Notes (Signed)
Chief Complaint  Patient presents with  . Annual Exam     Well Woman Cynthia Montgomery is here for a complete physical.   Her last physical was >1 year ago.  Current diet: in general, a "healthy" diet. Current exercise: walking, stretching. Weight is increasing and she denies daytime fatigue. Seatbelt? Yes  Health Maintenance Colonoscopy- Yes Shingrix- No Mammogram- Yes Tetanus- No Pneumonia- No Hep C screen- No  Past Medical History:  Diagnosis Date  . Arthritis   . Asthma   . Cataract   . Colon polyps   . High cholesterol   . Hypertension   . Positive TB test      Past Surgical History:  Procedure Laterality Date  . CATARACT EXTRACTION, BILATERAL  2018  . KNEE SURGERY    . TUBAL LIGATION      Medications  Current Outpatient Medications on File Prior to Visit  Medication Sig Dispense Refill  . albuterol (PROVENTIL HFA;VENTOLIN HFA) 108 (90 Base) MCG/ACT inhaler Inhale 2 puffs into the lungs every 6 (six) hours as needed for wheezing or shortness of breath. 1 Inhaler 2  . amLODipine (NORVASC) 5 MG tablet Take 1 tablet (5 mg total) by mouth daily. 30 tablet 0  . aspirin EC 81 MG tablet Take 81 mg by mouth daily.    . baclofen (LIORESAL) 10 MG tablet Take 1 tablet (10 mg total) by mouth 2 (two) times daily. 30 each 0  . cholecalciferol (VITAMIN D) 400 units TABS tablet Take 400 Units by mouth daily.    . Coenzyme Q10 (CO Q 10) 100 MG CAPS Take 1 capsule by mouth daily.    . fluticasone (FLONASE) 50 MCG/ACT nasal spray Place 2 sprays into both nostrils daily. 16 g 2  . losartan (COZAAR) 25 MG tablet Take 1 tablet (25 mg total) by mouth daily. 30 tablet 0  . Multiple Vitamin (MULTIVITAMIN) tablet Take 1 tablet by mouth daily.    . nebivolol (BYSTOLIC) 5 MG tablet Take 1 tablet (5 mg total) by mouth daily. 30 tablet 3  . nitroGLYCERIN (NITROSTAT) 0.4 MG SL tablet Place 1 tablet (0.4 mg total) under the tongue every 5 (five) minutes as needed for chest pain. 25 tablet 3  .  Omega-3 Fatty Acids (FISH OIL) 1000 MG CAPS Take 1 capsule by mouth daily.    . rosuvastatin (CRESTOR) 10 MG tablet Take 1 tablet (10 mg total) by mouth daily. 30 tablet 3   Allergies No Known Allergies  Review of Systems: Constitutional:  no sweats Eye:  no recent significant change in vision Ear/Nose/Mouth/Throat:  Ears:  No changes in hearing Nose/Mouth/Throat:  no complaints of nasal congestion, no sore throat Cardiovascular: no chest pain Respiratory:  No cough and no shortness of breath Gastrointestinal:  no abdominal pain, no change in bowel habits GU:  Female: negative for dysuria or pelvic pain Musculoskeletal/Extremities:  no pain of the joints Integumentary (Skin/Breast):  no abnormal skin lesions reported Neurologic:  no headaches Psychiatric:  no anxiety, no depression Endocrine:  denies unexplained weight changes Hematologic/Lymphatic:  no abnormal bleeding  Exam BP (!) 142/82 (BP Location: Left Arm, Patient Position: Sitting, Cuff Size: Large)   Pulse 69   Temp 97.7 F (36.5 C) (Oral)   Ht 5\' 2"  (1.575 m)   Wt 198 lb (89.8 kg)   SpO2 98%   BMI 36.21 kg/m  General:  well developed, well nourished, in no apparent distress Skin:  no significant moles, warts, or growths Head:  no masses,  lesions, or tenderness Eyes:  pupils equal and round, sclera anicteric without injection Ears:  canals without lesions, TMs shiny without retraction, no obvious effusion, no erythema Nose:  nares patent, septum midline, mucosa normal, and no drainage or sinus tenderness Throat/Pharynx:  lips and gingiva without lesion; tongue and uvula midline; non-inflamed pharynx; no exudates or postnasal drainage Neck: neck supple without adenopathy, thyromegaly, or masses Lungs:  clear to auscultation, breath sounds equal bilaterally, no respiratory distress Cardio:  regular rate and rhythm, no bruits or LE edema Abdomen:  abdomen soft, nontender; bowel sounds normal; no masses or  organomegaly Genital: Deferred Musculoskeletal:  symmetrical muscle groups noted without atrophy or deformity Extremities:  no clubbing, cyanosis, or edema, no deformities, no skin discoloration Neuro:  gait normal; deep tendon reflexes normal and symmetric Psych: well oriented with normal range of affect and appropriate judgment/insight  Assessment and Plan  Well adult exam - Plan: Comprehensive metabolic panel, Lipid panel  Encounter for hepatitis C screening test for low risk patient - Plan: Hepatitis C antibody   Well 68 y.o. female. Counseled on diet and exercise. Contact pharmacy regarding Shingrix.  Will need to update Tdap and PCV23, out of both currently. F/u at earliest convenience for fasting labs.  Other orders as above. Follow up in 6 mopending the above workup. The patient voiced understanding and agreement to the plan.  [place], DO 09/01/18 1:33 PM

## 2018-09-16 ENCOUNTER — Other Ambulatory Visit: Payer: Self-pay

## 2018-09-16 ENCOUNTER — Other Ambulatory Visit (INDEPENDENT_AMBULATORY_CARE_PROVIDER_SITE_OTHER): Payer: Medicare HMO

## 2018-09-16 DIAGNOSIS — Z1159 Encounter for screening for other viral diseases: Secondary | ICD-10-CM | POA: Diagnosis not present

## 2018-09-16 DIAGNOSIS — Z0000 Encounter for general adult medical examination without abnormal findings: Secondary | ICD-10-CM | POA: Diagnosis not present

## 2018-09-16 LAB — COMPREHENSIVE METABOLIC PANEL
ALT: 21 U/L (ref 0–35)
AST: 20 U/L (ref 0–37)
Albumin: 4.1 g/dL (ref 3.5–5.2)
Alkaline Phosphatase: 59 U/L (ref 39–117)
BUN: 17 mg/dL (ref 6–23)
CO2: 25 mEq/L (ref 19–32)
Calcium: 9 mg/dL (ref 8.4–10.5)
Chloride: 104 mEq/L (ref 96–112)
Creatinine, Ser: 0.89 mg/dL (ref 0.40–1.20)
GFR: 76.29 mL/min (ref >60.00)
Glucose, Bld: 69 mg/dL — ABNORMAL LOW (ref 70–99)
Potassium: 3.6 mEq/L (ref 3.5–5.1)
Sodium: 139 mEq/L (ref 135–145)
Total Bilirubin: 0.4 mg/dL (ref 0.2–1.2)
Total Protein: 7 g/dL (ref 6.0–8.3)

## 2018-09-16 LAB — LIPID PANEL
Cholesterol: 168 mg/dL (ref 0–200)
HDL: 70.2 mg/dL (ref >39.00)
LDL Cholesterol: 85 mg/dL (ref 0–99)
NonHDL: 97.99
Total CHOL/HDL Ratio: 2
Triglycerides: 66 mg/dL (ref 0.0–149.0)
VLDL: 13.2 mg/dL (ref 0.0–40.0)

## 2018-09-17 LAB — HEPATITIS C ANTIBODY
Hepatitis C Ab: NONREACTIVE
SIGNAL TO CUT-OFF: 0.03 (ref <1.00)

## 2018-09-20 ENCOUNTER — Telehealth (HOSPITAL_COMMUNITY): Payer: Self-pay | Admitting: Emergency Medicine

## 2018-09-20 DIAGNOSIS — Z1159 Encounter for screening for other viral diseases: Secondary | ICD-10-CM | POA: Diagnosis not present

## 2018-09-20 DIAGNOSIS — R0602 Shortness of breath: Secondary | ICD-10-CM | POA: Diagnosis not present

## 2018-09-20 NOTE — Telephone Encounter (Signed)
Reaching out to patient to offer assistance regarding upcoming cardiac imaging study; pt verbalizes understanding of appt date/time, parking situation and where to check in, pre-test NPO status and medications ordered, and verified current allergies; name and call back number provided for further questions should they arise Davonda Ausley RN Navigator Cardiac Imaging [facility] [phone] office [phone] cell  Pt denies covid symptoms, verbalized understanding of visitor policy. 

## 2018-09-21 ENCOUNTER — Ambulatory Visit (HOSPITAL_COMMUNITY): Admission: RE | Admit: 2018-09-21 | Payer: Medicare HMO | Source: Ambulatory Visit

## 2018-09-21 ENCOUNTER — Encounter: Payer: Medicare HMO | Admitting: *Deleted

## 2018-09-21 ENCOUNTER — Ambulatory Visit (HOSPITAL_COMMUNITY)
Admission: RE | Admit: 2018-09-21 | Discharge: 2018-09-21 | Disposition: A | Discharge status: Home / Self Care | Payer: Medicare HMO | Source: Ambulatory Visit | Attending: Cardiology | Admitting: Cardiology

## 2018-09-21 ENCOUNTER — Other Ambulatory Visit: Payer: Self-pay

## 2018-09-21 DIAGNOSIS — I2510 Atherosclerotic heart disease of native coronary artery without angina pectoris: Secondary | ICD-10-CM | POA: Insufficient documentation

## 2018-09-21 DIAGNOSIS — R9439 Abnormal result of other cardiovascular function study: Secondary | ICD-10-CM | POA: Diagnosis not present

## 2018-09-21 DIAGNOSIS — Z006 Encounter for examination for normal comparison and control in clinical research program: Secondary | ICD-10-CM

## 2018-09-21 MED ORDER — METOPROLOL TARTRATE 5 MG/5ML IV SOLN: 5.0000 mg | INTRAVENOUS | Status: DC | PRN

## 2018-09-21 MED ORDER — NITROGLYCERIN 0.4 MG SL SUBL
SUBLINGUAL_TABLET | SUBLINGUAL | Status: AC
  Filled 2018-09-21: qty 2

## 2018-09-21 MED ORDER — SODIUM CHLORIDE 0.9% FLUSH: 10.0000 mL | Freq: Two times a day (BID) | INTRAVENOUS | Status: DC

## 2018-09-21 MED ORDER — NITROGLYCERIN 0.4 MG SL SUBL: 0.8000 mg | SUBLINGUAL_TABLET | SUBLINGUAL | Status: DC | PRN

## 2018-09-21 MED ORDER — IOHEXOL 350 MG/ML SOLN
100.0000 mL | Freq: Once | INTRAVENOUS | Status: AC | PRN
  Administered 2018-09-21: 14:00:00 100 mL via INTRAVENOUS

## 2018-09-21 MED ORDER — SODIUM CHLORIDE 0.9% FLUSH: 10.0000 mL | INTRAVENOUS | Status: DC | PRN

## 2018-09-21 NOTE — Research (Signed)
CADFEM Informed Consent                  Subject Name:   Tahja Liao   Subject met inclusion and exclusion criteria.  The informed consent form, study requirements and expectations were reviewed with the subject and questions and concerns were addressed prior to the signing of the consent form.  The subject verbalized understanding of the trial requirements.  The subject agreed to participate in the CADFEM trial and signed the informed consent.  The informed consent was obtained prior to performance of any protocol-specific procedures for the subject.  A copy of the signed informed consent was given to the subject and a copy was placed in the subject's medical record.   [place] Chal[place], Research Assistant 09/21/2018 11:27 a.m.

## 2018-10-12 ENCOUNTER — Telehealth: Payer: Self-pay | Admitting: Cardiology

## 2018-10-12 NOTE — Progress Notes (Signed)
Virtual Visit via Telephone Note   This visit type was conducted due to national recommendations for restrictions regarding the COVID-19 Pandemic (e.g. social distancing) in an effort to limit this patient's exposure and mitigate transmission in our community.  Due to her co-morbid illnesses, this patient is at least at moderate risk for complications without adequate follow up.  This format is felt to be most appropriate for this patient at this time.  The patient did not have access to video technology/had technical difficulties with video requiring transitioning to audio format only (telephone).  All issues noted in this document were discussed and addressed.  No physical exam could be performed with this format.  Please refer to the patient's chart for her  consent to telehealth for [facility]. 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  She has rare anginal discomfort will need to continue take nitroglycerin as needed and will need follow-up with me in 1 year because of abnormal EKG with bifascicular heart block ID:  Cynthia Montgomery, DOB [birth year], MRN [id number]  PCP:  Shelda Pal, DO  Cardiologist:  Shirlee More, MD    Referring MD: Shelda Pal*    ASSESSMENT:    1. Mild CAD   2. Hyperlipidemia, unspecified hyperlipidemia type   3. Essential hypertension   4. RBBB    PLAN:    In order of problems listed above:  1. Stable continue medical treatment I will renew her antihypertensives I told her to take nitroglycerin if needed we will plan to see her in the office in 1 year.  She is reassured 2. Stable continue her statin with CAD 3. Poorly controlled she has been out of multiple antihypertensives and will call today and renew her medications till seen with her PCP 4. 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  Concerns regarding medicines are outlined above.  No orders of the defined types were placed in this encounter.  No orders of the defined types were placed in this encounter.   No chief complaint on file.   History of Present Illness:    Cynthia Montgomery is a 68 y.o. female with a hx of hypertension RBBB and hyperlipidemia referred after having an  abnormal myocardial perfusion study 07/13/2018 with anterior ischemia EF 70%.   She was last seen 08/17/2018. Compliance with diet, lifestyle and medications: Yes  She is a little bit frustrated today she could make the video link work we did a telephone visit after extensive coaching by me failed to establish the connection.  She is out of antihypertensives and I told her my office would call them in today until she is seen with the PCP she has had no further angina dyspnea palpitation or syncope Past Medical History:  Diagnosis Date  . Arthritis   . Asthma   . Cataract   . Colon polyps   . High cholesterol   . Hypertension   . Positive TB test     Past Surgical History:  Procedure Laterality Date  . CATARACT EXTRACTION, BILATERAL  2018  . KNEE SURGERY    . TUBAL LIGATION      Current Medications: Current Meds  Medication Sig  . albuterol (PROVENTIL HFA;VENTOLIN HFA) 108 (90 Base) MCG/ACT inhaler Inhale 2 puffs into the lungs every 6 (six) hours as needed for wheezing or shortness of breath.  Marland Kitchen. aspirin EC 81 MG tablet Take 81 mg by mouth daily.  . baclofen (LIORESAL) 10 MG tablet Take 1 tablet (10 mg total) by mouth 2 (two) times daily.  .  cholecalciferol (VITAMIN D) 400 units TABS tablet Take 400 Units by mouth daily.  . Coenzyme Q10 (CO Q 10) 100 MG CAPS Take 1 capsule by mouth daily.  . Multiple Vitamin (MULTIVITAMIN) tablet Take 1 tablet by mouth daily.  . nebivolol (BYSTOLIC) 5 MG tablet Take 1 tablet (5 mg total) by mouth daily.  . nitroGLYCERIN (NITROSTAT) 0.4 MG SL tablet Place 1 tablet (0.4 mg total) under the tongue every 5 (five) minutes as needed for chest pain.  . Omega-3 Fatty Acids (FISH OIL) 1000 MG CAPS Take 1 capsule by mouth daily.  . rosuvastatin (CRESTOR) 10 MG tablet Take 1 tablet (10 mg total) by mouth daily.     Allergies:   Patient has no known allergies.   Social History   Socioeconomic History  . Marital status: Married    Spouse name: Not on file  . Number of children: Not on file  . Years of education: Not on file  . Highest education level: Not on file  Occupational History  . Not on file  Social Needs  . Financial resource strain: Not on file  . Food insecurity    Worry: Not on file    Inability: Not on file  . Transportation needs    Medical: Not on file    Non-medical: Not on file  Tobacco Use  . Smoking status: Never Smoker  . Smokeless tobacco: Never Used  Substance and Sexual Activity  . Alcohol use: No  . Drug use: No  . Sexual activity: Yes    Birth control/protection: None  Lifestyle  . Physical activity    Days per week: Not on file    Minutes per session: Not on file  . Stress: Not on file  Relationships  . Social Musicianconnections    Talks on phone: Not on file    Gets together: Not on file    Attends religious service: Not on file    Active member of club or organization: Not on file    Attends meetings of clubs or organizations: Not on file    Relationship status: Not on file  Other Topics Concern  . Not on file  Social History Narrative  . Not on file     Family History: The patient's family history includes Arthritis in her daughter and mother; Asthma in  her daughter; Cancer in her father; Diabetes in her sister; Hypertension in her mother and sister; Kidney disease in her daughter. ROS:   Please see the history of present illness.    All other systems reviewed and are negative.  EKGs/Labs/Other Studies Reviewed:    The following studies were reviewed today: Cardiac CTA 09/21/2018: 1. Calcium score 50 isolated to proximal LAD with 1-24% calcified plaque, this is 7267 thpercentile for age and sex 2.  Normal aortic root 3.6 cm 3.  CAD RADS 1 non obstructive CAD in proximal LAD  EKG:  EKG:  07/06/18 from [facility]   Sinus rhythm. 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 Abnormal.      Recent Labs: 09/16/2018: ALT 21; BUN 17; Creatinine, Ser 0.89; Potassium 3.6; Sodium 139  Recent Lipid Panel    Component Value Date/Time   CHOL 168 09/16/2018 1046   TRIG 66.0 09/16/2018 1046   HDL 70.20 09/16/2018 1046   CHOLHDL 2 09/16/2018 1046   VLDL 13.2 09/16/2018 1046   LDLCALC 85 09/16/2018 1046    Physical Exam:    VS:  BP (!) 170/120 (BP Location: Left Arm)   Pulse 71   Wt 198 lb 3.2 oz (89.9 kg)   BMI 36.25 kg/m     Wt Readings from Last 3 Encounters:  10/13/18 198 lb 3.2 oz (89.9 kg)  09/01/18 198 lb (89.8 kg)  08/17/18 200 lb (90.7 kg)       Signed, Norman HerrlichBrian Chardae Mulkern, MD  10/13/2018 3:24 PM    [facility]

## 2018-10-12 NOTE — Telephone Encounter (Signed)
Called to remind pt of appt, mailbox is full and could not leave a message

## 2018-10-13 ENCOUNTER — Encounter: Payer: Self-pay | Admitting: Cardiology

## 2018-10-13 ENCOUNTER — Telehealth: Payer: Self-pay | Admitting: Cardiology

## 2018-10-13 ENCOUNTER — Other Ambulatory Visit: Payer: Self-pay

## 2018-10-13 ENCOUNTER — Telehealth (INDEPENDENT_AMBULATORY_CARE_PROVIDER_SITE_OTHER): Payer: Medicare HMO | Admitting: Cardiology

## 2018-10-13 VITALS — BP 170/120 | HR 71 | Wt 198.2 lb

## 2018-10-13 DIAGNOSIS — I10 Essential (primary) hypertension: Secondary | ICD-10-CM | POA: Diagnosis not present

## 2018-10-13 DIAGNOSIS — I4510 Unspecified right bundle-branch block: Secondary | ICD-10-CM

## 2018-10-13 DIAGNOSIS — I2510 Atherosclerotic heart disease of native coronary artery without angina pectoris: Secondary | ICD-10-CM | POA: Diagnosis not present

## 2018-10-13 DIAGNOSIS — E785 Hyperlipidemia, unspecified: Secondary | ICD-10-CM

## 2018-10-13 MED ORDER — LOSARTAN POTASSIUM 25 MG PO TABS: 25.0000 mg | ORAL_TABLET | Freq: Every day | ORAL | 1 refills | Status: DC

## 2018-10-13 MED ORDER — NEBIVOLOL HCL 5 MG PO TABS: 5.0000 mg | ORAL_TABLET | Freq: Every day | ORAL | 1 refills | Status: DC

## 2018-10-13 MED ORDER — AMLODIPINE BESYLATE 5 MG PO TABS: 5.0000 mg | ORAL_TABLET | Freq: Every day | ORAL | 1 refills | Status: DC

## 2018-10-13 NOTE — Patient Instructions (Signed)
Medication Instructions:  Your physician recommends that you continue on your current medications as directed. Please refer to the Current Medication list given to you today.  If you need a refill on your cardiac medications before your next appointment, please call your pharmacy.   Lab work: None If you have labs (blood work) drawn today and your tests are completely normal, you will receive your results only by: Marland Kitchen MyChart Message (if you have MyChart) OR . A paper copy in the mail If you have any lab test that is abnormal or we need to change your treatment, we will call you to review the results.  Testing/Procedures: None  Follow-Up: At [facility], you and your health needs are our priority.  As part of our continuing mission to provide you with exceptional heart care, we have created designated Provider Care Teams.  These Care Teams include your primary Cardiologist (physician) and Advanced Practice Providers (APPs -  Physician Assistants and Nurse Practitioners) who all work together to provide you with the care you need, when you need it. You will need a follow up appointment in 1 years.  Please call our office 2 months in advance to schedule this appointment.  You may see Shirlee More, MD or another member of our [place] Provider Team in [place]: Jenne Campus, MD . Jyl Heinz, MD  Any Other Special Instructions Will Be Listed Below (If Applicable).

## 2018-10-13 NOTE — Telephone Encounter (Signed)
Virtual Visit Pre-Appointment Phone Call  "(Name), I am calling you today to discuss your upcoming appointment. We are currently trying to limit exposure to the virus that causes COVID-19 by seeing patients at home rather than in the office."  1. "What is the BEST phone number to call the day of the visit?" - include this in appointment notes  2. Do you have or have access to (through a family member/friend) a smartphone with video capability that we can use for your visit?" a. If yes - list this number in appt notes as cell (if different from BEST phone #) and list the appointment type as a VIDEO visit in appointment notes b. If no - list the appointment type as a PHONE visit in appointment notes  3. Confirm consent - "In the setting of the current Covid19 crisis, you are scheduled for a (phone or video) visit with your provider on (date) at (time).  Just as we do with many in-office visits, in order for you to participate in this visit, we must obtain consent.  If you'd like, I can send this to your mychart (if signed up) or email for you to review.  Otherwise, I can obtain your verbal consent now.  All virtual visits are billed to your insurance company just like a normal visit would be.  By agreeing to a virtual visit, we'd like you to understand that the technology does not allow for your provider to perform an examination, and thus may limit your provider's ability to fully assess your condition. If your provider identifies any concerns that need to be evaluated in person, we will make arrangements to do so.  Finally, though the technology is pretty good, we cannot assure that it will always work on either your or our end, and in the setting of a video visit, we may have to convert it to a phone-only visit.  In either situation, we cannot ensure that we have a secure connection.  Are you willing to proceed?" STAFF: Did the patient verbally acknowledge consent to telehealth visit? Document  YES/NO here: Yes  4. Advise patient to be prepared - "Two hours prior to your appointment, go ahead and check your blood pressure, pulse, oxygen saturation, and your weight (if you have the equipment to check those) and write them all down. When your visit starts, your provider will ask you for this information. If you have an Apple Watch or Kardia device, please plan to have heart rate information ready on the day of your appointment. Please have a pen and paper handy nearby the day of the visit as well."  5. Give patient instructions for MyChart download to smartphone OR Doximity/Doxy.me as below if video visit (depending on what platform provider is using)  6. Inform patient they will receive a phone call 15 minutes prior to their appointment time (may be from unknown caller ID) so they should be prepared to answer    TELEPHONE CALL NOTE  Cynthia Montgomery has been deemed a candidate for a follow-up tele-health visit to limit community exposure during the Covid-19 pandemic. I spoke with the patient via phone to ensure availability of phone/video source, confirm preferred email & phone number, and discuss instructions and expectations.  I reminded Cynthia Montgomery to be prepared with any vital sign and/or heart rhythm information that could potentially be obtained via home monitoring, at the time of her visit. I reminded Cynthia Montgomery to expect a phone call prior to her visit.  [name] 10/13/2018 8:39 AM   INSTRUCTIONS FOR DOWNLOADING THE MYCHART APP TO SMARTPHONE  - The patient must first make sure to have activated MyChart and know their login information - If Apple, go to [employer] and type in MyChart in the search bar and download the app. If Android, ask patient to go to [employer] and type in [place] in the search bar and download the app. The app is free but as with any other app downloads, their phone may require them to verify saved payment information or Apple/Android  password.  - The patient will need to then log into the app with their MyChart username and password, and select [facility] as their healthcare provider to link the account. When it is time for your visit, go to the MyChart app, find appointments, and click Begin Video Visit. Be sure to Select Allow for your device to access the Microphone and Camera for your visit. You will then be connected, and your provider will be with you shortly.  **If they have any issues connecting, or need assistance please contact MyChart service desk [phone])**  **If using a computer, in order to ensure the best quality for their visit they will need to use either of the following Internet Browsers: [employer], or Google Chrome**  IF USING DOXIMITY or DOXY.ME - The patient will receive a link just prior to their visit by text.     FULL LENGTH CONSENT FOR TELE-HEALTH VISIT   I hereby voluntarily request, consent and authorize [facility] and its employed or contracted physicians, physician assistants, nurse practitioners or other licensed health care professionals (the Practitioner), to provide me with telemedicine health care services (the Services") as deemed necessary by the treating Practitioner. I acknowledge and consent to receive the Services by the Practitioner via telemedicine. I understand that the telemedicine visit will involve communicating with the Practitioner through live audiovisual communication technology and the disclosure of certain medical information by electronic transmission. I acknowledge that I have been given the opportunity to request an in-person assessment or other available alternative prior to the telemedicine visit and am voluntarily participating in the telemedicine visit.  I understand that I have the right to withhold or withdraw my consent to the use of telemedicine in the course of my care at any time, without affecting my right to future care or treatment,  and that the Practitioner or I may terminate the telemedicine visit at any time. I understand that I have the right to inspect all information obtained and/or recorded in the course of the telemedicine visit and may receive copies of available information for a reasonable fee.  I understand that some of the potential risks of receiving the Services via telemedicine include:   Delay or interruption in medical evaluation due to technological equipment failure or disruption;  Information transmitted may not be sufficient (e.g. poor resolution of images) to allow for appropriate medical decision making by the Practitioner; and/or   In rare instances, security protocols could fail, causing a breach of personal health information.  Furthermore, I acknowledge that it is my responsibility to provide information about my medical history, conditions and care that is complete and accurate to the best of my ability. I acknowledge that Practitioner's advice, recommendations, and/or decision may be based on factors not within their control, such as incomplete or inaccurate data provided by me or distortions of diagnostic images or specimens that may result from electronic transmissions. I understand that the  practice of medicine is not an Chief Strategy Officer and that Practitioner makes no warranties or guarantees regarding treatment outcomes. I acknowledge that I will receive a copy of this consent concurrently upon execution via email to the email address I last provided but may also request a printed copy by calling the office of [facility].    I understand that my insurance will be billed for this visit.   I have read or had this consent read to me.  I understand the contents of this consent, which adequately explains the benefits and risks of the Services being provided via telemedicine.   I have been provided ample opportunity to ask questions regarding this consent and the Services and have had my questions  answered to my satisfaction.  I give my informed consent for the services to be provided through the use of telemedicine in my medical care  By participating in this telemedicine visit I agree to the above.

## 2018-10-13 NOTE — Addendum Note (Signed)
Addended by: Particia Nearing B on: 10/13/2018 03:46 PM   Modules accepted: Orders

## 2018-11-15 ENCOUNTER — Other Ambulatory Visit: Payer: Self-pay

## 2018-11-15 ENCOUNTER — Other Ambulatory Visit: Payer: Self-pay | Admitting: Cardiology

## 2018-11-15 MED ORDER — ROSUVASTATIN CALCIUM 10 MG PO TABS: 10.0000 mg | ORAL_TABLET | Freq: Every day | ORAL | 3 refills | Status: DC

## 2018-11-15 NOTE — Telephone Encounter (Signed)
°*  STAT* If patient is at the pharmacy, call can be transferred to refill team.   1. Which medications need to be refilled? (please list name of each medication and dose if known) Rosuvastatin  2. Which pharmacy/location (including street and city if local pharmacy) is medication to be sent to?[facility] in [place] [address]  3. Do they need a 30 day or 90 day supply? [place]

## 2018-11-18 MED ORDER — ROSUVASTATIN CALCIUM 10 MG PO TABS: 10.0000 mg | ORAL_TABLET | Freq: Every day | ORAL | 2 refills | Status: DC

## 2018-11-18 NOTE — Addendum Note (Signed)
Addended by: Stevan Born on: 11/18/2018 09:52 AM   Modules accepted: Orders

## 2018-11-30 ENCOUNTER — Telehealth: Payer: Self-pay | Admitting: Family Medicine

## 2018-11-30 NOTE — Telephone Encounter (Signed)
Copied from [place] [id number]. Topic: General - Call Back - No Documentation >> Nov 30, 2018  1:48 PM Cynthia Montgomery wrote: Reason for CRM:   Pt states she is returning a missed call to the office, no message was left.  No messages in chart regarding a call to this patient today 11/30/2018.

## 2018-12-08 ENCOUNTER — Other Ambulatory Visit (HOSPITAL_BASED_OUTPATIENT_CLINIC_OR_DEPARTMENT_OTHER): Payer: Self-pay | Admitting: Family Medicine

## 2018-12-08 DIAGNOSIS — Z1231 Encounter for screening mammogram for malignant neoplasm of breast: Secondary | ICD-10-CM

## 2018-12-20 DIAGNOSIS — H52229 Regular astigmatism, unspecified eye: Secondary | ICD-10-CM | POA: Diagnosis not present

## 2018-12-20 DIAGNOSIS — E7800 Pure hypercholesterolemia, unspecified: Secondary | ICD-10-CM | POA: Diagnosis not present

## 2018-12-20 DIAGNOSIS — H26493 Other secondary cataract, bilateral: Secondary | ICD-10-CM | POA: Diagnosis not present

## 2018-12-20 DIAGNOSIS — H35033 Hypertensive retinopathy, bilateral: Secondary | ICD-10-CM | POA: Diagnosis not present

## 2018-12-20 NOTE — Progress Notes (Signed)
Subjective:   Cynthia Montgomery is a 68 y.o. female who presents for Medicare Annual (Subsequent) preventive examination.  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   Review of Systems:  Cardiac Risk Factors include: advanced age (>33men, >23 women);hypertension;dyslipidemia Home Safety/Smoke Alarms: Feels safe in home. Smoke alarms in place.  Lives in 1 story home with husband and granddaughter.   Female:   Pap- 01/11/18      Mammo- schedule 01/04/19      CCS-Due 09/15/20         Objective:     Vitals: BP 140/88 (BP Location: Left Arm, Patient Position: Sitting, Cuff Size: Normal)   Pulse 71   Temp (!) 97 F (36.1 C) (Temporal)   Wt 202 lb 6.4 oz (91.8 kg)   SpO2 98%   BMI 37.02 kg/m   Body mass index is 37.02 kg/m.  Advanced Directives 12/21/2018 12/14/2017 09/26/2017  Does Patient Have a Medical Advance Directive? No No No  Would patient like information on creating a medical advance directive? No - Patient declined No - Patient declined No - Patient declined    Tobacco Social History   Tobacco Use  Smoking Status Former Smoker  . Types: Cigarettes  Smokeless Tobacco Never Used  Tobacco Comment   quit 30 years ago     Counseling given: Not Answered Comment: quit 30 years ago   Clinical Intake: Pain : No/denies pain    Past Medical History:  Diagnosis Date  . Arthritis   . Asthma   . Cataract   . Colon polyps   . High cholesterol   . Hypertension   . Positive TB test    Past Surgical History:  Procedure Laterality Date  . CATARACT EXTRACTION, BILATERAL  2018  . KNEE SURGERY    . TUBAL LIGATION     Family History  Problem Relation Age of Onset  . Arthritis Mother   . Hypertension Mother   . Cancer Father   . Diabetes Sister   . Hypertension Sister   . Arthritis Daughter   . Asthma Daughter   . Kidney disease Daughter    Social History   Socioeconomic History  . Marital status: Married    Spouse name: Not on  file  . Number of children: Not on file  . Years of education: Not on file  . Highest education level: Not on file  Occupational History  . Not on file  Social Needs  . Financial resource strain: Not on file  . Food insecurity    Worry: Not on file    Inability: Not on file  . Transportation needs    Medical: Not on file    Non-medical: Not on file  Tobacco Use  . Smoking status: Former Smoker    Types: Cigarettes  . Smokeless tobacco: Never Used  . Tobacco comment: quit 30 years ago  Substance and Sexual Activity  . Alcohol use: No  . Drug use: No  . Sexual activity: Yes    Birth control/protection: None  Lifestyle  . Physical activity    Days per week: Not on file    Minutes per session: Not on file  . Stress: Not on file  Relationships  . Social Herbalist on phone: Not on file    Gets together: Not on file    Attends religious service: Not on file    Active member of club or organization: Not on file  Attends meetings of clubs or organizations: Not on file    Relationship status: Not on file  Other Topics Concern  . Not on file  Social History Narrative  . Not on file    Outpatient Encounter Medications as of 12/21/2018  Medication Sig  . amLODipine (NORVASC) 5 MG tablet Take 1 tablet (5 mg total) by mouth daily.  Marland Kitchen aspirin EC 81 MG tablet Take 81 mg by mouth daily.  . baclofen (LIORESAL) 10 MG tablet Take 1 tablet (10 mg total) by mouth 2 (two) times daily.  . cholecalciferol (VITAMIN D) 400 units TABS tablet Take 400 Units by mouth daily.  . Coenzyme Q10 (CO Q 10) 100 MG CAPS Take 1 capsule by mouth daily.  Marland Kitchen losartan (COZAAR) 25 MG tablet Take 1 tablet (25 mg total) by mouth daily.  . Multiple Vitamin (MULTIVITAMIN) tablet Take 1 tablet by mouth daily.  . nebivolol (BYSTOLIC) 5 MG tablet Take 1 tablet (5 mg total) by mouth daily.  . Omega-3 Fatty Acids (FISH OIL) 1000 MG CAPS Take 1 capsule by mouth daily.  . rosuvastatin (CRESTOR) 10 MG tablet  Take 1 tablet (10 mg total) by mouth daily.  Marland Kitchen albuterol (PROVENTIL HFA;VENTOLIN HFA) 108 (90 Base) MCG/ACT inhaler Inhale 2 puffs into the lungs every 6 (six) hours as needed for wheezing or shortness of breath. (Patient not taking: Reported on 12/21/2018)  . nitroGLYCERIN (NITROSTAT) 0.4 MG SL tablet Place 1 tablet (0.4 mg total) under the tongue every 5 (five) minutes as needed for chest pain.   No facility-administered encounter medications on file as of 12/21/2018.     Activities of Daily Living In your present state of health, do you have any difficulty performing the following activities: 12/21/2018  Hearing? N  Vision? N  Difficulty concentrating or making decisions? N  Walking or climbing stairs? N  Dressing or bathing? N  Doing errands, shopping? N  Preparing Food and eating ? N  Using the Toilet? N  In the past six months, have you accidently leaked urine? N  Do you have problems with loss of bowel control? N  Managing your Medications? N  Managing your Finances? N  Housekeeping or managing your Housekeeping? N  Some recent data might be hidden    Patient Care Team: Sharlene Dory, DO as PCP - General (Family Medicine) Baldo Daub, MD as PCP - Cardiology (Cardiology)    Assessment:   This is a routine wellness examination for Cynthia Montgomery.Physical assessment deferred to PCP.  Exercise Activities and Dietary recommendations Current Exercise Habits: The patient has a physically strenuous job, but has no regular exercise apart from work.(cleans 3 days/ week), Exercise limited by: None identified Diet (meal preparation, eat out, water intake, caffeinated beverages, dairy products, fruits and vegetables): high salt. Pt educated on lower salt foods.     Goals    . Increase physical activity       Fall Risk Fall Risk  12/21/2018 12/14/2017  Falls in the past year? 0 No   Depression Screen PHQ 2/9 Scores 12/21/2018 12/14/2017  PHQ - 2 Score 0 0     Cognitive  Function Ad8 score reviewed for issues:  Issues making decisions:no  Less interest in hobbies / activities:no  Repeats questions, stories (family complaining):no  Trouble using ordinary gadgets (microwave, computer, phone):no  Forgets the month or year: no  Mismanaging finances: no  Remembering appts:no  Daily problems with thinking and/or memory:no Ad8 score is=0  Immunization History  Administered Date(s) Administered  . Influenza, High Dose Seasonal PF 12/14/2017    Screening Tests Health Maintenance  Topic Date Due  . PNA vac Low Risk Adult (2 of 2 - PCV13) 12/30/2016  . INFLUENZA VACCINE  10/23/2018  . MAMMOGRAM  12/29/2019  . COLONOSCOPY  03/24/2020  . TETANUS/TDAP  04/02/2025  . DEXA SCAN  Completed  . Hepatitis C Screening  Completed     Plan:    Please schedule your next medicare wellness visit with me in 1 yr.  Continue to eat heart healthy diet (full of fruits, vegetables, whole grains, lean protein, water--limit salt, fat, and sugar intake) and increase physical activity as tolerated.  Continue doing brain stimulating activities (puzzles, reading, adult coloring books, staying active) to keep memory sharp.    I have personally reviewed and noted the following in the patient's chart:   . Medical and social history . Use of alcohol, tobacco or illicit drugs  . Current medications and supplements . Functional ability and status . Nutritional status . Physical activity . Advanced directives . List of other physicians . Hospitalizations, surgeries, and ER visits in previous 12 months . Vitals . Screenings to include cognitive, depression, and falls . Referrals and appointments  In addition, I have reviewed and discussed with patient certain preventive protocols, quality metrics, and best practice recommendations. A written personalized care plan for preventive services as well as general preventive health recommendations were provided to  patient.     Mady HaagensenBritt, Linzi Ohlinger Echo[place], [place]  12/21/2018

## 2018-12-21 ENCOUNTER — Ambulatory Visit (INDEPENDENT_AMBULATORY_CARE_PROVIDER_SITE_OTHER): Payer: Medicare HMO | Admitting: *Deleted

## 2018-12-21 ENCOUNTER — Encounter: Payer: Self-pay | Admitting: *Deleted

## 2018-12-21 ENCOUNTER — Other Ambulatory Visit: Payer: Self-pay

## 2018-12-21 VITALS — BP 140/88 | HR 71 | Temp 97.0°F | Ht 62.0 in | Wt 202.4 lb

## 2018-12-21 DIAGNOSIS — Z23 Encounter for immunization: Secondary | ICD-10-CM

## 2018-12-21 DIAGNOSIS — Z0000 Encounter for general adult medical examination without abnormal findings: Secondary | ICD-10-CM

## 2018-12-21 NOTE — Patient Instructions (Addendum)
Please schedule your next medicare wellness visit with me in 1 yr.  Continue to eat heart healthy diet (full of fruits, vegetables, whole grains, lean protein, water--limit salt, fat, and sugar intake) and increase physical activity as tolerated.  Continue doing brain stimulating activities (puzzles, reading, adult coloring books, staying active) to keep memory sharp.    Cynthia Montgomery , Thank you for taking time to come for your Medicare Wellness Visit. I appreciate your ongoing commitment to your health goals. Please review the following plan we discussed and let me know if I can assist you in the future.   These are the goals we discussed: Goals    . Increase physical activity       This is a list of the screening recommended for you and due dates:  Health Maintenance  Topic Date Due  . Pneumonia vaccines (2 of 2 - PCV13) 12/30/2016  . Flu Shot  10/23/2018  . Mammogram  12/29/2019  . Colon Cancer Screening  03/24/2020  . Tetanus Vaccine  04/02/2025  . DEXA scan (bone density measurement)  Completed  .  Hepatitis C: One time screening is recommended by Center for Disease Control  (CDC) for  adults born from [birth year] through 1965.   Completed    Health Maintenance After Age 55 After age 26, you are at a higher risk for certain long-term diseases and infections as well as injuries from falls. Falls are a major cause of broken bones and head injuries in people who are older than age 63. Getting regular preventive care can help to keep you healthy and well. Preventive care includes getting regular testing and making lifestyle changes as recommended by your health care provider. Talk with your health care provider about:  Which screenings and tests you should have. A screening is a test that checks for a disease when you have no symptoms.  A diet and exercise plan that is right for you. What should I know about screenings and tests to prevent falls? Screening and testing are the best ways  to find a health problem early. Early diagnosis and treatment give you the best chance of managing medical conditions that are common after age 3. Certain conditions and lifestyle choices may make you more likely to have a fall. Your health care provider may recommend:  Regular vision checks. Poor vision and conditions such as cataracts can make you more likely to have a fall. If you wear glasses, make sure to get your prescription updated if your vision changes.  Medicine review. Work with your health care provider to regularly review all of the medicines you are taking, including over-the-counter medicines. Ask your health care provider about any side effects that may make you more likely to have a fall. Tell your health care provider if any medicines that you take make you feel dizzy or sleepy.  Osteoporosis screening. Osteoporosis is a condition that causes the bones to get weaker. This can make the bones weak and cause them to break more easily.  Blood pressure screening. Blood pressure changes and medicines to control blood pressure can make you feel dizzy.  Strength and balance checks. Your health care provider may recommend certain tests to check your strength and balance while standing, walking, or changing positions.  Foot health exam. Foot pain and numbness, as well as not wearing proper footwear, can make you more likely to have a fall.  Depression screening. You may be more likely to have a fall if you have a  fear of falling, feel emotionally low, or feel unable to do activities that you used to do.  Alcohol use screening. Using too much alcohol can affect your balance and may make you more likely to have a fall. What actions can I take to lower my risk of falls? General instructions  Talk with your health care provider about your risks for falling. Tell your health care provider if: ? You fall. Be sure to tell your health care provider about all falls, even ones that seem  minor. ? You feel dizzy, sleepy, or off-balance.  Take over-the-counter and prescription medicines only as told by your health care provider. These include any supplements.  Eat a healthy diet and maintain a healthy weight. A healthy diet includes low-fat dairy products, low-fat (lean) meats, and fiber from whole grains, beans, and lots of fruits and vegetables. Home safety  Remove any tripping hazards, such as rugs, cords, and clutter.  Install safety equipment such as grab bars in bathrooms and safety rails on stairs.  Keep rooms and walkways well-lit. Activity   Follow a regular exercise program to stay fit. This will help you maintain your balance. Ask your health care provider what types of exercise are appropriate for you.  If you need a cane or walker, use it as recommended by your health care provider.  Wear supportive shoes that have nonskid soles. Lifestyle  Do not drink alcohol if your health care provider tells you not to drink.  If you drink alcohol, limit how much you have: ? 0-1 drink a day for women. ? 0-2 drinks a day for men.  Be aware of how much alcohol is in your drink. In the [place] one drink equals one typical bottle of beer (12 oz), one-half glass of wine (5 oz), or one shot of hard liquor (1 oz).  Do not use any products that contain nicotine or tobacco, such as cigarettes and e-cigarettes. If you need help quitting, ask your health care provider. Summary  Having a healthy lifestyle and getting preventive care can help to protect your health and wellness after age 81.  Screening and testing are the best way to find a health problem early and help you avoid having a fall. Early diagnosis and treatment give you the best chance for managing medical conditions that are more common for people who are older than age 23.  Falls are a major cause of broken bones and head injuries in people who are older than age 34. Take precautions to prevent a fall at  home.  Work with your health care provider to learn what changes you can make to improve your health and wellness and to prevent falls. This information is not intended to replace advice given to you by your health care provider. Make sure you discuss any questions you have with your health care provider. Document Released: 01/21/2017 Document Revised: 07/01/2018 Document Reviewed: 01/21/2017 Elsevier Patient Education  2020 [employer].

## 2018-12-24 ENCOUNTER — Ambulatory Visit (INDEPENDENT_AMBULATORY_CARE_PROVIDER_SITE_OTHER): Payer: Medicare HMO | Admitting: Family Medicine

## 2018-12-24 ENCOUNTER — Other Ambulatory Visit: Payer: Self-pay

## 2018-12-24 ENCOUNTER — Encounter: Payer: Self-pay | Admitting: Family Medicine

## 2018-12-24 DIAGNOSIS — M545 Low back pain, unspecified: Secondary | ICD-10-CM

## 2018-12-24 DIAGNOSIS — S46819A Strain of other muscles, fascia and tendons at shoulder and upper arm level, unspecified arm, initial encounter: Secondary | ICD-10-CM

## 2018-12-24 DIAGNOSIS — Z23 Encounter for immunization: Secondary | ICD-10-CM

## 2018-12-24 MED ORDER — MELOXICAM 15 MG PO TABS: 15.0000 mg | ORAL_TABLET | Freq: Every day | ORAL | 0 refills | Status: DC

## 2018-12-24 NOTE — Progress Notes (Signed)
Musculoskeletal Exam  Patient: Cynthia Montgomery DOB: [birth year]  DOS: 12/24/2018  SUBJECTIVE:  Chief Complaint:   Chief Complaint  Patient presents with  . Hip Pain    left  . Joint Swelling    Cynthia Montgomery is a 68 y.o.  female for evaluation and treatment of neck, L neck and L ankle pain.   Onset:  2 weeks ago. No inj or change in activity.  Location: neck, hip, ankle Character:  burning  Progression of issue:  is unchanged Associated symptoms: some swelling over ankle No bruising, redness Treatment: to date has been OTC NSAIDs.   Neurovascular symptoms: no  ROS: Musculoskeletal/Extremities: +neck pain Endo: No weight loss  Past Medical History:  Diagnosis Date  . Arthritis   . Asthma   . Cataract   . Colon polyps   . High cholesterol   . Hypertension   . Positive TB test     Objective: VITAL SIGNS: BP 134/82 (BP Location: Left Arm, Patient Position: Sitting, Cuff Size: Large)   Pulse 72   Temp 97.9 F (36.6 C) (Temporal)   Ht 5\' 2"  (1.575 m)   Wt 201 lb 8 oz (91.4 kg)   SpO2 96%   BMI 36.85 kg/m  Constitutional: Well formed, well developed. No acute distress. Cardiovascular: Brisk cap refill Thorax & Lungs: No accessory muscle use Musculoskeletal: Low back and shoulders.    Tenderness to palpation: Yes over b/l trap and L lower lumbar parasp msc and SI jt Deformity: no Ecchymosis: no Tests positive: none Tests negative: straight leg Neurologic: Normal sensory function. No focal deficits noted. DTR's equal and symmetric in UE's/LE's. No clonus. Psychiatric: Normal mood. Age appropriate judgment and insight. Alert & oriented x 3.    Assessment:  Morbid obesity ([place])  Strain of trapezius muscle, unspecified laterality, initial encounter - Plan: meloxicam (MOBIC) 15 MG tablet  Need for vaccination against Streptococcus pneumoniae - Plan: Pneumococcal polysaccharide vaccine 23-valent greater than or equal to 2yo subcutaneous/IM  Acute left-sided low  back pain without sciatica - Plan: meloxicam (MOBIC) 15 MG tablet  Plan: 1-counseled on diet and exercise, healthy diet handout given.  Discussed referral to nutritionist/dietitian, medical weight loss team, and bariatric surgery.  She declined these 3 at the time. 2-heat, Tylenol, anti-inflammatory as above.  Stretches and exercises were given. 3- Final PCV23 4- heat, Tylenol, anti-inflammatory as above, stretches and exercises for this were given as well. F/u in 1 mo to reck. The patient voiced understanding and agreement to the plan.   [place], DO 12/24/18  4:57 PM

## 2018-12-24 NOTE — Patient Instructions (Addendum)
Heat (pad or rice pillow in microwave) over affected area, 10-15 minutes twice daily.   Trapezius stretches/exercises Do exercises exactly as told by your health care provider and adjust them as directed. It is normal to feel mild stretching, pulling, tightness, or discomfort as you do these exercises, but you should stop right away if you feel sudden pain or your pain gets worse.  Stretching and range of motion exercises These exercises warm up your muscles and joints and improve the movement and flexibility of your shoulder. These exercises can also help to relieve pain, numbness, and tingling. If you are unable to do any of the following for any reason, do not further attempt to do it.   Exercise A: Flexion, standing     1. Stand and hold a broomstick, a cane, or a similar object. Place your hands a little more than shoulder-width apart on the object. Your left / right hand should be palm-up, and your other hand should be palm-down. 2. Push the stick to raise your left / right arm out to your side and then over your head. Use your other hand to help move the stick. Stop when you feel a stretch in your shoulder, or when you reach the angle that is recommended by your health care provider. ? Avoid shrugging your shoulder while you raise your arm. Keep your shoulder blade tucked down toward your spine. 3. Hold for 30 seconds. 4. Slowly return to the starting position. Repeat 2 times. Complete this exercise 3 times per week.  Exercise B: Abduction, supine    1. Lie on your back and hold a broomstick, a cane, or a similar object. Place your hands a little more than shoulder-width apart on the object. Your left / right hand should be palm-up, and your other hand should be palm-down. 2. Push the stick to raise your left / right arm out to your side and then over your head. Use your other hand to help move the stick. Stop when you feel a stretch in your shoulder, or when you reach the angle that  is recommended by your health care provider. ? Avoid shrugging your shoulder while you raise your arm. Keep your shoulder blade tucked down toward your spine. 3. Hold for 30 seconds. 4. Slowly return to the starting position. Repeat 2 times. Complete this exercise 3 times per week.  Exercise C: Flexion, active-assisted    1. Lie on your back. You may bend your knees for comfort. 2. Hold a broomstick, a cane, or a similar object. Place your hands about shoulder-width apart on the object. Your palms should face toward your feet. 3. Raise the stick and move your arms over your head and behind your head, toward the floor. Use your healthy arm to help your left / right arm move farther. Stop when you feel a gentle stretch in your shoulder, or when you reach the angle where your health care provider tells you to stop. 4. Hold for 30 seconds. 5. Slowly return to the starting position. Repeat 2 times. Complete this exercise 3 times per week.  Exercise D: External rotation and abduction    1. Stand in a door frame with one of your feet slightly in front of the other. This is called a staggered stance. 2. Choose one of the following positions as told by your health care provider: ? Place your hands and forearms on the door frame above your head. ? Place your hands and forearms on the door frame  at the height of your head. ? Place your hands on the door frame at the height of your elbows. 3. Slowly move your weight onto your front foot until you feel a stretch across your chest and in the front of your shoulders. Keep your head and chest upright and keep your abdominal muscles tight. 4. Hold for 30 seconds. 5. To release the stretch, shift your weight to your back foot. Repeat 2 times. Complete this stretch 3 times per week.  Strengthening exercises These exercises build strength and endurance in your shoulder. Endurance is the ability to use your muscles for a long time, even after your muscles  get tired. Exercise E: Scapular depression and adduction  1. Sit on a stable chair. Support your arms in front of you with pillows, armrests, or a tabletop. Keep your elbows in line with the sides of your body. 2. Gently move your shoulder blades down toward your middle back. Relax the muscles on the tops of your shoulders and in the back of your neck. 3. Hold for 3 seconds. 4. Slowly release the tension and relax your muscles completely before doing this exercise again. Repeat for a total of 10 repetitions. 5. After you have practiced this exercise, try doing the exercise without the arm support. Then, try the exercise while standing instead of sitting. Repeat 2 times. Complete this exercise 3 times per week.  Exercise F: Shoulder abduction, isometric    1. Stand or sit about 4-6 inches (10-15 cm) from a wall with your left / right side facing the wall. 2. Bend your left / right elbow and gently press your elbow against the wall. 3. Increase the pressure slowly until you are pressing as hard as you can without shrugging your shoulder. 4. Hold for 3 seconds. 5. Slowly release the tension and relax your muscles completely. Repeat for a total of 10 repetitions. Repeat 2 times. Complete this exercise 3 times per week.  Exercise G: Shoulder flexion, isometric    1. Stand or sit about 4-6 inches (10-15 cm) away from a wall with your left / right side facing the wall. 2. Keep your left / right elbow straight and gently press the top of your fist against the wall. Increase the pressure slowly until you are pressing as hard as you can without shrugging your shoulder. 3. Hold for 10-15 seconds. 4. Slowly release the tension and relax your muscles completely. Repeat for a total of 10 repetitions. Repeat 2 times. Complete this exercise 3 times per week.  Exercise H: Internal rotation    1. Sit in a stable chair without armrests, or stand. Secure an exercise band at your left / right side, at  elbow height. 2. Place a soft object, such as a folded towel or a small pillow, under your left / right upper arm so your elbow is a few inches (about 8 cm) away from your side. 3. Hold the end of the exercise band so the band stretches. 4. Keeping your elbow pressed against the soft object under your arm, move your forearm across your body toward your abdomen. Keep your body steady so the movement is only coming from your shoulder. 5. Hold for 3 seconds. 6. Slowly return to the starting position. Repeat for a total of 10 repetitions. Repeat 2 times. Complete this exercise 3 times per week.  Exercise I: External rotation    1. Sit in a stable chair without armrests, or stand. 2. Secure an exercise band at your  left / right side, at elbow height. 3. Place a soft object, such as a folded towel or a small pillow, under your left / right upper arm so your elbow is a few inches (about 8 cm) away from your side. 4. Hold the end of the exercise band so the band stretches. 5. Keeping your elbow pressed against the soft object under your arm, move your forearm out, away from your abdomen. Keep your body steady so the movement is only coming from your shoulder. 6. Hold for 3 seconds. 7. Slowly return to the starting position. Repeat for a total of 10 repetitions. Repeat 2 times. Complete this exercise 3 times per week. Exercise J: Shoulder extension  1. Sit in a stable chair without armrests, or stand. Secure an exercise band to a stable object in front of you so the band is at shoulder height. 2. Hold one end of the exercise band in each hand. Your palms should face each other. 3. Straighten your elbows and lift your hands up to shoulder height. 4. Step back, away from the secured end of the exercise band, until the band stretches. 5. Squeeze your shoulder blades together and pull your hands down to the sides of your thighs. Stop when your hands are straight down by your sides. Do not let your hands  go behind your body. 6. Hold for 3 seconds. 7. Slowly return to the starting position. Repeat for a total of 10 repetitions. Repeat 2 times. Complete this exercise 3 times per week.  Exercise K: Shoulder extension, prone    1. Lie on your abdomen on a firm surface so your left / right arm hangs over the edge. 2. Hold a 5 lb weight in your hand so your palm faces in toward your body. Your arm should be straight. 3. Squeeze your shoulder blade down toward the middle of your back. 4. Slowly raise your arm behind you, up to the height of the surface that you are lying on. Keep your arm straight. 5. Hold for 3 seconds. 6. Slowly return to the starting position and relax your muscles. Repeat for a total of 10 repetitions. Repeat 2 times. Complete this exercise 3 times per week.   Exercise L: Horizontal abduction, prone  1. Lie on your abdomen on a firm surface so your left / right arm hangs over the edge. 2. Hold a 5 lb weight in your hand so your palm faces toward your feet. Your arm should be straight. 3. Squeeze your shoulder blade down toward the middle of your back. 4. Bend your elbow so your hand moves up, until your elbow is bent to an "L" shape (90 degrees). With your elbow bent, slowly move your forearm forward and up. Raise your hand up to the height of the surface that you are lying on. ? Your upper arm should not move, and your elbow should stay bent. ? At the top of the movement, your palm should face the floor. 5. Hold for 3 seconds. 6. Slowly return to the starting position and relax your muscles. Repeat for a total of 10 repetitions. Repeat 2 times. Complete this exercise 3 times per week.  Exercise M: Horizontal abduction, standing  1. Sit on a stable chair, or stand. 2. Secure an exercise band to a stable object in front of you so the band is at shoulder height. 3. Hold one end of the exercise band in each hand. 4. Straighten your elbows and lift your hands straight in  front of you, up to shoulder height. Your palms should face down, toward the floor. 5. Step back, away from the secured end of the exercise band, until the band stretches. 6. Move your arms out to your sides, and keep your arms straight. 7. Hold for 3 seconds. 8. Slowly return to the starting position. Repeat for a total of 10 repetitions. Repeat 2 times. Complete this exercise 3 times per week.  Exercise N: Scapular retraction and elevation  1. Sit on a stable chair, or stand. 2. Secure an exercise band to a stable object in front of you so the band is at shoulder height. 3. Hold one end of the exercise band in each hand. Your palms should face each other. 4. Sit in a stable chair without armrests, or stand. 5. Step back, away from the secured end of the exercise band, until the band stretches. 6. Squeeze your shoulder blades together and lift your hands over your head. Keep your elbows straight. 7. Hold for 3 seconds. 8. Slowly return to the starting position. Repeat for a total of 10 repetitions. Repeat 2 times. Complete this exercise 3 times per week.  This information is not intended to replace advice given to you by your health care provider. Make sure you discuss any questions you have with your health care provider. Document Released: 03/10/2005 Document Revised: 11/15/2015 Document Reviewed: 01/25/2015 [employer] Interactive Patient Education  2017 [employer]  EXERCISES  RANGE OF MOTION (ROM) AND STRETCHING EXERCISES - Low Back Pain Most people with lower back pain will find that their symptoms get worse with excessive bending forward (flexion) or arching at the lower back (extension). The exercises that will help resolve your symptoms will focus on the opposite motion.  If you have pain, numbness or tingling which travels down into your buttocks, leg or foot, the goal of the therapy is for these symptoms to move closer to your back and eventually resolve. Sometimes, these leg  symptoms will get better, but your lower back pain may worsen. This is often an indication of progress in your rehabilitation. Be very alert to any changes in your symptoms and the activities in which you participated in the 24 hours prior to the change. Sharing this information with your caregiver will allow him or her to most efficiently treat your condition. These exercises may help you when beginning to rehabilitate your injury. Your symptoms may resolve with or without further involvement from your physician, physical therapist or athletic trainer. While completing these exercises, remember:   Restoring tissue flexibility helps normal motion to return to the joints. This allows healthier, less painful movement and activity.  An effective stretch should be held for at least 30 seconds.  A stretch should never be painful. You should only feel a gentle lengthening or release in the stretched tissue. FLEXION RANGE OF MOTION AND STRETCHING EXERCISES:  STRETCH - Flexion, Single Knee to Chest   Lie on a firm bed or floor with both legs extended in front of you.  Keeping one leg in contact with the floor, bring your opposite knee to your chest. Hold your leg in place by either grabbing behind your thigh or at your knee.  Pull until you feel a gentle stretch in your low back. Hold 30 seconds.  Slowly release your grasp and repeat the exercise with the opposite side. Repeat 2 times. Complete this exercise 3 times per week.   STRETCH - Flexion, Double Knee to Chest  Lie on a firm  bed or floor with both legs extended in front of you.  Keeping one leg in contact with the floor, bring your opposite knee to your chest.  Tense your stomach muscles to support your back and then lift your other knee to your chest. Hold your legs in place by either grabbing behind your thighs or at your knees.  Pull both knees toward your chest until you feel a gentle stretch in your low back. Hold 30  seconds.  Tense your stomach muscles and slowly return one leg at a time to the floor. Repeat 2 times. Complete this exercise 3 times per week.   STRETCH - Low Trunk Rotation  Lie on a firm bed or floor. Keeping your legs in front of you, bend your knees so they are both pointed toward the ceiling and your feet are flat on the floor.  Extend your arms out to the side. This will stabilize your upper body by keeping your shoulders in contact with the floor.  Gently and slowly drop both knees together to one side until you feel a gentle stretch in your low back. Hold for 30 seconds.  Tense your stomach muscles to support your lower back as you bring your knees back to the starting position. Repeat the exercise to the other side. Repeat 2 times. Complete this exercise at least 3 times per week.   EXTENSION RANGE OF MOTION AND FLEXIBILITY EXERCISES:  STRETCH - Extension, Prone on Elbows   Lie on your stomach on the floor, a bed will be too soft. Place your palms about shoulder width apart and at the height of your head.  Place your elbows under your shoulders. If this is too painful, stack pillows under your chest.  Allow your body to relax so that your hips drop lower and make contact more completely with the floor.  Hold this position for 30 seconds.  Slowly return to lying flat on the floor. Repeat 2 times. Complete this exercise 3 times per week.   RANGE OF MOTION - Extension, Prone Press Ups  Lie on your stomach on the floor, a bed will be too soft. Place your palms about shoulder width apart and at the height of your head.  Keeping your back as relaxed as possible, slowly straighten your elbows while keeping your hips on the floor. You may adjust the placement of your hands to maximize your comfort. As you gain motion, your hands will come more underneath your shoulders.  Hold this position 30 seconds.  Slowly return to lying flat on the floor. Repeat 2 times. Complete this  exercise 3 times per week.   RANGE OF MOTION- Quadruped, Neutral Spine   Assume a hands and knees position on a firm surface. Keep your hands under your shoulders and your knees under your hips. You may place padding under your knees for comfort.  Drop your head and point your tailbone toward the ground below you. This will round out your lower back like an angry cat. Hold this position for 30 seconds.  Slowly lift your head and release your tail bone so that your back sags into a large arch, like an old horse.  Hold this position for 30 seconds.  Repeat this until you feel limber in your low back.  Now, find your "sweet spot." This will be the most comfortable position somewhere between the two previous positions. This is your neutral spine. Once you have found this position, tense your stomach muscles to support your low  back.  Hold this position for 30 seconds. Repeat 2 times. Complete this exercise 3 times per week.   STRENGTHENING EXERCISES - Low Back Sprain These exercises may help you when beginning to rehabilitate your injury. These exercises should be done near your "sweet spot." This is the neutral, low-back arch, somewhere between fully rounded and fully arched, that is your least painful position. When performed in this safe range of motion, these exercises can be used for people who have either a flexion or extension based injury. These exercises may resolve your symptoms with or without further involvement from your physician, physical therapist or athletic trainer. While completing these exercises, remember:   Muscles can gain both the endurance and the strength needed for everyday activities through controlled exercises.  Complete these exercises as instructed by your physician, physical therapist or athletic trainer. Increase the resistance and repetitions only as guided.  You may experience muscle soreness or fatigue, but the pain or discomfort you are trying to eliminate  should never worsen during these exercises. If this pain does worsen, stop and make certain you are following the directions exactly. If the pain is still present after adjustments, discontinue the exercise until you can discuss the trouble with your caregiver.  STRENGTHENING - Deep Abdominals, Pelvic Tilt   Lie on a firm bed or floor. Keeping your legs in front of you, bend your knees so they are both pointed toward the ceiling and your feet are flat on the floor.  Tense your lower abdominal muscles to press your low back into the floor. This motion will rotate your pelvis so that your tail bone is scooping upwards rather than pointing at your feet or into the floor. With a gentle tension and even breathing, hold this position for 3 seconds. Repeat 2 times. Complete this exercise 3 times per week.   STRENGTHENING - Abdominals, Crunches   Lie on a firm bed or floor. Keeping your legs in front of you, bend your knees so they are both pointed toward the ceiling and your feet are flat on the floor. Cross your arms over your chest.  Slightly tip your chin down without bending your neck.  Tense your abdominals and slowly lift your trunk high enough to just clear your shoulder blades. Lifting higher can put excessive stress on the lower back and does not further strengthen your abdominal muscles.  Control your return to the starting position. Repeat 2 times. Complete this exercise 3 times per week.   STRENGTHENING - Quadruped, Opposite UE/LE Lift   Assume a hands and knees position on a firm surface. Keep your hands under your shoulders and your knees under your hips. You may place padding under your knees for comfort.  Find your neutral spine and gently tense your abdominal muscles so that you can maintain this position. Your shoulders and hips should form a rectangle that is parallel with the floor and is not twisted.  Keeping your trunk steady, lift your right hand no higher than your  shoulder and then your left leg no higher than your hip. Make sure you are not holding your breath. Hold this position for 30 seconds.  Continuing to keep your abdominal muscles tense and your back steady, slowly return to your starting position. Repeat with the opposite arm and leg. Repeat 2 times. Complete this exercise 3 times per week.   STRENGTHENING - Abdominals and Quadriceps, Straight Leg Raise   Lie on a firm bed or floor with both legs extended  in front of you.  Keeping one leg in contact with the floor, bend the other knee so that your foot can rest flat on the floor.  Find your neutral spine, and tense your abdominal muscles to maintain your spinal position throughout the exercise.  Slowly lift your straight leg off the floor about 6 inches for a count of 3, making sure to not hold your breath.  Still keeping your neutral spine, slowly lower your leg all the way to the floor. Repeat this exercise with each leg 2 times. Complete this exercise 3 times per week.  POSTURE AND BODY MECHANICS CONSIDERATIONS - Low Back Sprain Keeping correct posture when sitting, standing or completing your activities will reduce the stress put on different body tissues, allowing injured tissues a chance to heal and limiting painful experiences. The following are general guidelines for improved posture.  While reading these guidelines, remember:  The exercises prescribed by your provider will help you have the flexibility and strength to maintain correct postures.  The correct posture provides the best environment for your joints to work. All of your joints have less wear and tear when properly supported by a spine with good posture. This means you will experience a healthier, less painful body.  Correct posture must be practiced with all of your activities, especially prolonged sitting and standing. Correct posture is as important when doing repetitive low-stress activities (typing) as it is when doing  a single heavy-load activity (lifting).  RESTING POSITIONS Consider which positions are most painful for you when choosing a resting position. If you have pain with flexion-based activities (sitting, bending, stooping, squatting), choose a position that allows you to rest in a less flexed posture. You would want to avoid curling into a fetal position on your side. If your pain worsens with extension-based activities (prolonged standing, working overhead), avoid resting in an extended position such as sleeping on your stomach. Most people will find more comfort when they rest with their spine in a more neutral position, neither too rounded nor too arched. Lying on a non-sagging bed on your side with a pillow between your knees, or on your back with a pillow under your knees will often provide some relief. Keep in mind, being in any one position for a prolonged period of time, no matter how correct your posture, can still lead to stiffness.  PROPER SITTING POSTURE In order to minimize stress and discomfort on your spine, you must sit with correct posture. Sitting with good posture should be effortless for a healthy body. Returning to good posture is a gradual process. Many people can work toward this most comfortably by using various supports until they have the flexibility and strength to maintain this posture on their own. When sitting with proper posture, your ears will fall over your shoulders and your shoulders will fall over your hips. You should use the back of the chair to support your upper back. Your lower back will be in a neutral position, just slightly arched. You may place a small pillow or folded towel at the base of your lower back for  support.  When working at a desk, create an environment that supports good, upright posture. Without extra support, muscles tire, which leads to excessive strain on joints and other tissues. Keep these recommendations in mind:  CHAIR:  A chair should be  able to slide under your desk when your back makes contact with the back of the chair. This allows you to work closely.  The chair's height should allow your eyes to be level with the upper part of your monitor and your hands to be slightly lower than your elbows.  BODY POSITION  Your feet should make contact with the floor. If this is not possible, use a foot rest.  Keep your ears over your shoulders. This will reduce stress on your neck and low back.  INCORRECT SITTING POSTURES  If you are feeling tired and unable to assume a healthy sitting posture, do not slouch or slump. This puts excessive strain on your back tissues, causing more damage and pain. Healthier options include:  Using more support, like a lumbar pillow.  Switching tasks to something that requires you to be upright or walking.  Talking a brief walk.  Lying down to rest in a neutral-spine position.  PROLONGED STANDING WHILE SLIGHTLY LEANING FORWARD  When completing a task that requires you to lean forward while standing in one place for a long time, place either foot up on a stationary 2-4 inch high object to help maintain the best posture. When both feet are on the ground, the lower back tends to lose its slight inward curve. If this curve flattens (or becomes too large), then the back and your other joints will experience too much stress, tire more quickly, and can cause pain.  CORRECT STANDING POSTURES Proper standing posture should be assumed with all daily activities, even if they only take a few moments, like when brushing your teeth. As in sitting, your ears should fall over your shoulders and your shoulders should fall over your hips. You should keep a slight tension in your abdominal muscles to brace your spine. Your tailbone should point down to the ground, not behind your body, resulting in an over-extended swayback posture.   INCORRECT STANDING POSTURES  Common incorrect standing postures include a forward  head, locked knees and/or an excessive swayback. WALKING Walk with an upright posture. Your ears, shoulders and hips should all line-up.  PROLONGED ACTIVITY IN A FLEXED POSITION When completing a task that requires you to bend forward at your waist or lean over a low surface, try to find a way to stabilize 3 out of 4 of your limbs. You can place a hand or elbow on your thigh or rest a knee on the surface you are reaching across. This will provide you more stability, so that your muscles do not tire as quickly. By keeping your knees relaxed, or slightly bent, you will also reduce stress across your lower back. CORRECT LIFTING TECHNIQUES  DO :  Assume a wide stance. This will provide you more stability and the opportunity to get as close as possible to the object which you are lifting.  Tense your abdominals to brace your spine. Bend at the knees and hips. Keeping your back locked in a neutral-spine position, lift using your leg muscles. Lift with your legs, keeping your back straight.  Test the weight of unknown objects before attempting to lift them.  Try to keep your elbows locked down at your sides in order get the best strength from your shoulders when carrying an object.     Always ask for help when lifting heavy or awkward objects. INCORRECT LIFTING TECHNIQUES DO NOT:   Lock your knees when lifting, even if it is a small object.  Bend and twist. Pivot at your feet or move your feet when needing to change directions.  Assume that you can safely pick up even a paperclip without proper posture.  Keep the diet clean and stay active.  Aim to do some physical exertion for 150 minutes per week. This is typically divided into 5 days per week, 30 minutes per day. The activity should be enough to get your heart rate up. Anything is better than nothing if you have time constraints.  Healthy Eating Plan Many factors influence your heart health, including eating and exercise habits. Heart  (coronary) risk increases with abnormal blood fat (lipid) levels. Heart-healthy meal planning includes limiting unhealthy fats, increasing healthy fats, and making other small dietary changes. This includes maintaining a healthy body weight to help keep lipid levels within a normal range.  WHAT IS MY PLAN?  Your health care provider recommends that you:  Drink a glass of water before meals to help with satiety.  Eat slowly.  An alternative to the water is to add Metamucil. This will help with satiety as well. It does contain calories, unlike water.  WHAT TYPES OF FAT SHOULD I CHOOSE?  Choose healthy fats more often. Choose monounsaturated and polyunsaturated fats, such as olive oil and canola oil, flaxseeds, walnuts, almonds, and seeds.  Eat more omega-3 fats. Good choices include salmon, mackerel, sardines, tuna, flaxseed oil, and ground flaxseeds. Aim to eat fish at least two times each week.  Avoid foods with partially hydrogenated oils in them. These contain trans fats. Examples of foods that contain trans fats are stick margarine, some tub margarines, cookies, crackers, and other baked goods. If you are going to avoid a fat, this is the one to avoid!  WHAT GENERAL GUIDELINES DO I NEED TO FOLLOW?  Check food labels carefully to identify foods with trans fats. Avoid these types of options when possible.  Fill one half of your plate with vegetables and green salads. Eat 4-5 servings of vegetables per day. A serving of vegetables equals 1 cup of raw leafy vegetables,  cup of raw or cooked cut-up vegetables, or  cup of vegetable juice.  Fill one fourth of your plate with whole grains. Look for the word "whole" as the first word in the ingredient list.  Fill one fourth of your plate with lean protein foods.  Eat 4-5 servings of fruit per day. A serving of fruit equals one medium whole fruit,  cup of dried fruit,  cup of fresh, frozen, or canned fruit. Try to avoid fruits in  cups/syrups as the sugar content can be high.  Eat more foods that contain soluble fiber. Examples of foods that contain this type of fiber are apples, broccoli, carrots, beans, peas, and barley. Aim to get 20-30 g of fiber per day.  Eat more home-cooked food and less restaurant, buffet, and fast food.  Limit or avoid alcohol.  Limit foods that are high in starch and sugar.  Avoid fried foods when able.  Cook foods by using methods other than frying. Baking, boiling, grilling, and broiling are all great options. Other fat-reducing suggestions include: ? Removing the skin from poultry. ? Removing all visible fats from meats. ? Skimming the fat off of stews, soups, and gravies before serving them. ? Steaming vegetables in water or broth.  Lose weight if you are overweight. Losing just 5-10% of your initial body weight can help your overall health and prevent diseases such as diabetes and heart disease.  Increase your consumption of nuts, legumes, and seeds to 4-5 servings per week. One serving of dried beans or legumes equals  cup after being cooked, one serving of nuts equals 1 ounces,  and one serving of seeds equals  ounce or 1 tablespoon.  WHAT ARE GOOD FOODS CAN I EAT? Grains Grainy breads (try to find bread that is 3 g of fiber per slice or greater), oatmeal, light popcorn. Whole-grain cereals. Rice and pasta, including brown rice and those that are made with whole wheat. Edamame pasta is a great alternative to grain pasta. It has a higher protein content. Try to avoid significant consumption of white bread, sugary cereals, or pastries/baked goods.  Vegetables All vegetables. Cooked white potatoes do not count as vegetables.  Fruits All fruits, but limit pineapple and bananas as these fruits have a higher sugar content.  Meats and Other Protein Sources Lean, well-trimmed beef, veal, pork, and lamb. Chicken and [place] without skin. All fish and shellfish. Wild duck, rabbit,  pheasant, and venison. Egg whites or low-cholesterol egg substitutes. Dried beans, peas, lentils, and tofu.Seeds and most nuts.  Dairy Low-fat or nonfat cheeses, including ricotta, string, and mozzarella. Skim or 1% milk that is liquid, powdered, or evaporated. Buttermilk that is made with low-fat milk. Nonfat or low-fat yogurt. Soy/Almond milk are good alternatives if you cannot handle dairy.  Beverages Water is the best for you. Sports drinks with less sugar are more desirable unless you are a highly active athlete.  Sweets and Desserts Sherbets and fruit ices. Honey, jam, marmalade, jelly, and syrups. Dark chocolate.  Eat all sweets and desserts in moderation.  Fats and Oils Nonhydrogenated (trans-free) margarines. Vegetable oils, including soybean, sesame, sunflower, olive, peanut, safflower, corn, canola, and cottonseed. Salad dressings or mayonnaise that are made with a vegetable oil. Limit added fats and oils that you use for cooking, baking, salads, and as spreads.  Other Cocoa powder. Coffee and tea. Most condiments.  The items listed above may not be a complete list of recommended foods or beverages. Contact your dietitian for more options.

## 2019-01-03 ENCOUNTER — Telehealth: Payer: Self-pay | Admitting: Family Medicine

## 2019-01-03 ENCOUNTER — Other Ambulatory Visit: Payer: Self-pay | Admitting: Family Medicine

## 2019-01-03 DIAGNOSIS — M545 Low back pain, unspecified: Secondary | ICD-10-CM

## 2019-01-03 DIAGNOSIS — S46819A Strain of other muscles, fascia and tendons at shoulder and upper arm level, unspecified arm, initial encounter: Secondary | ICD-10-CM

## 2019-01-03 MED ORDER — AMLODIPINE BESYLATE 5 MG PO TABS: 5.0000 mg | ORAL_TABLET | Freq: Every day | ORAL | 2 refills | Status: DC

## 2019-01-03 MED ORDER — BACLOFEN 10 MG PO TABS: 10.0000 mg | ORAL_TABLET | Freq: Two times a day (BID) | ORAL | 0 refills | Status: DC

## 2019-01-03 MED ORDER — LOSARTAN POTASSIUM 25 MG PO TABS: 25.0000 mg | ORAL_TABLET | Freq: Every day | ORAL | 0 refills | Status: DC

## 2019-01-03 NOTE — Telephone Encounter (Signed)
Medication Refill - Medication: baclofen (LIORESAL) 10 MG tablet [[id number]]  amLODipine (NORVASC) 5 MG tablet [[id number]]    Has the patient contacted their pharmacy? No. (Agent: If no, request that the patient contact the pharmacy for the refill.) (Agent: If yes, when and what did the pharmacy advise?)  Preferred Pharmacy (with phone number or street name):  [place], [place] - [place] [phone] (Phone)     Agent: Please be advised that RX refills may take up to 3 business days. We ask that you follow-up with your pharmacy.

## 2019-01-03 NOTE — Telephone Encounter (Signed)
Copied from [place] [id number]. Topic: Quick Communication - Rx Refill/Question >> Jan 03, 2019  2:20 PM Carolyn Stare wrote: Medication   losartan (COZAAR) 25 MG tablet      pt said Dr Bettina Gavia refill this med for her in July but she is out of the medication and req a refill, has a refill left but according to records it is to early to refill    Preferred Pharmacy  [facility] [address] [place]   Agent: Please be advised that RX refills may take up to 3 business days. We ask that you follow-up with your pharmacy.   Refilled and called the patient to inform/no answer/no voice mail.

## 2019-01-04 ENCOUNTER — Other Ambulatory Visit: Payer: Self-pay

## 2019-01-04 ENCOUNTER — Encounter (HOSPITAL_BASED_OUTPATIENT_CLINIC_OR_DEPARTMENT_OTHER): Payer: Self-pay

## 2019-01-04 ENCOUNTER — Ambulatory Visit (HOSPITAL_BASED_OUTPATIENT_CLINIC_OR_DEPARTMENT_OTHER)
Admission: RE | Admit: 2019-01-04 | Discharge: 2019-01-04 | Disposition: A | Discharge status: Home / Self Care | Payer: Medicare HMO | Source: Ambulatory Visit | Attending: Family Medicine | Admitting: Family Medicine

## 2019-01-04 DIAGNOSIS — Z1231 Encounter for screening mammogram for malignant neoplasm of breast: Secondary | ICD-10-CM | POA: Diagnosis not present

## 2019-01-17 ENCOUNTER — Other Ambulatory Visit: Payer: Self-pay

## 2019-01-18 ENCOUNTER — Encounter: Payer: Self-pay | Admitting: Family Medicine

## 2019-01-18 ENCOUNTER — Other Ambulatory Visit: Payer: Self-pay

## 2019-01-18 ENCOUNTER — Ambulatory Visit (INDEPENDENT_AMBULATORY_CARE_PROVIDER_SITE_OTHER): Payer: Medicare HMO | Admitting: Family Medicine

## 2019-01-18 VITALS — BP 132/82 | HR 71 | Temp 96.0°F | Ht 62.0 in | Wt 200.5 lb

## 2019-01-18 DIAGNOSIS — S46819A Strain of other muscles, fascia and tendons at shoulder and upper arm level, unspecified arm, initial encounter: Secondary | ICD-10-CM | POA: Diagnosis not present

## 2019-01-18 DIAGNOSIS — M545 Low back pain, unspecified: Secondary | ICD-10-CM

## 2019-01-18 DIAGNOSIS — R300 Dysuria: Secondary | ICD-10-CM | POA: Diagnosis not present

## 2019-01-18 LAB — POCT URINALYSIS DIPSTICK
Appearance: NEGATIVE
Bilirubin, UA: NEGATIVE
Blood, UA: NEGATIVE
Glucose, UA: NEGATIVE
Ketones, UA: NEGATIVE
Leukocytes, UA: NEGATIVE
Nitrite, UA: NEGATIVE
Protein, UA: NEGATIVE
Spec Grav, UA: 1.015 (ref 1.010–1.025)
Urobilinogen, UA: 0.2 E.U./dL
pH, UA: 6 (ref 5.0–8.0)

## 2019-01-18 MED ORDER — MELOXICAM 15 MG PO TABS: 15.0000 mg | ORAL_TABLET | Freq: Every day | ORAL | 1 refills | Status: DC

## 2019-01-18 MED ORDER — SULFAMETHOXAZOLE-TRIMETHOPRIM 800-160 MG PO TABS: 1.0000 | ORAL_TABLET | Freq: Two times a day (BID) | ORAL | 0 refills | Status: AC

## 2019-01-18 NOTE — Progress Notes (Signed)
Chief Complaint  Patient presents with  . Follow-up  . Dysuria    Cynthia Montgomery is a 68 y.o. female here for possible UTI.  Duration: 2 days. Symptoms: Dysuria, urinary frequency, retention, urinary hesitancy and urgency Denies: hematuria, fever, nausea, vomiting and flank pain, vaginal discharge Hx of recurrent UTI? No Denies new sexual partners.   F/u for trap/LBP. Was somewhat compliant w stretches/exercises that did help. Took Mobic that was quite helpful. Also got a massage chair. Feels much better overall.   ROS:  Constitutional: denies fever GU: As noted in HPI  Past Medical History:  Diagnosis Date  . Arthritis   . Asthma   . Cataract   . Colon polyps   . High cholesterol   . Hypertension   . Positive TB test      BP 132/82 (BP Location: Left Arm, Patient Position: Sitting, Cuff Size: Normal)   Pulse 71   Temp (!) 96 F (35.6 C) (Temporal)   Ht 5\' 2"  (1.575 m)   Wt 200 lb 8 oz (90.9 kg)   SpO2 97%   BMI 36.67 kg/m  General: Awake, alert, appears stated age Heart: RRR Lungs: CTAB, normal respiratory effort, no accessory muscle usage Abd: BS+, soft, NT, ND, no masses or organomegaly MSK: No CVA tenderness, neg Lloyd's sign; no ttp over traps b/l; no ttp over lumbar parasp msc or SI jt. No midline tenderness.  Neuro: Gait normal, no cerebellar signs Psych: Age appropriate judgment and insight  Dysuria - Plan: sulfamethoxazole-trimethoprim (BACTRIM DS) 800-160 MG tablet  Strain of trapezius muscle, unspecified laterality, initial encounter - Plan: meloxicam (MOBIC) 15 MG tablet  Acute left-sided low back pain without sciatica - Plan: meloxicam (MOBIC) 15 MG tablet  1- UA neg, will empirically tx w Bactrim given her hx. Stay hydrated. Seek immediate care if pt starts to develop fevers, new/worsening symptoms, uncontrollable N/V.  2/3- Cont stretches/exercises, prn Mobic. Try to use Tylenol.  F/u in 5 mo for med ck. The patient voiced understanding and  agreement to the plan.  [place], DO 01/18/19 7:53 AM

## 2019-01-18 NOTE — Addendum Note (Signed)
Addended by: Sharon Seller B on: 01/18/2019 08:01 AM   Modules accepted: Orders

## 2019-01-18 NOTE — Patient Instructions (Addendum)
Stay hydrated.   Warning signs/symptoms: Uncontrollable nausea/vomiting, fevers, worsening symptoms despite treatment, confusion.  Give [place] around 2 business days to get culture back to you.  You may need to use the stretches 1-2 times per week for maintenance.  Heat (pad or rice pillow in microwave) over affected area, 10-15 minutes twice daily.   Try to use the meloxicam/Mobic as needed. Tylenol should be the first line option.  OK to take Tylenol 1000 mg (2 extra strength tabs) or 975 mg (3 regular strength tabs) every 6 hours as needed.  Try MiraLAX 1-2 times daily over the next 3-4 days. If no improvement, try using an enema. Stay well hydrated and keep lots of fiber in your diet.  Let us know if you need anything.

## 2019-01-20 LAB — URINE CULTURE
MICRO NUMBER:: 1034733
Result:: NO GROWTH
SPECIMEN QUALITY:: ADEQUATE

## 2019-02-09 ENCOUNTER — Other Ambulatory Visit: Payer: Self-pay | Admitting: Family Medicine

## 2019-02-28 ENCOUNTER — Ambulatory Visit: Payer: Medicare HMO | Admitting: Family Medicine

## 2019-02-28 ENCOUNTER — Telehealth: Payer: Self-pay | Admitting: Cardiology

## 2019-02-28 NOTE — Telephone Encounter (Signed)
Wants something cheaper than bistolic

## 2019-03-01 NOTE — Telephone Encounter (Signed)
Left message to return call 

## 2019-03-30 ENCOUNTER — Ambulatory Visit: Payer: Medicare HMO | Attending: Internal Medicine

## 2019-03-30 DIAGNOSIS — Z20822 Contact with and (suspected) exposure to covid-19: Secondary | ICD-10-CM | POA: Diagnosis not present

## 2019-04-01 LAB — NOVEL CORONAVIRUS, NAA: SARS-CoV-2, NAA: NOT DETECTED

## 2019-04-03 ENCOUNTER — Other Ambulatory Visit: Payer: Self-pay | Admitting: Family Medicine

## 2019-04-06 ENCOUNTER — Ambulatory Visit: Payer: Self-pay | Admitting: Family Medicine

## 2019-04-06 NOTE — Telephone Encounter (Signed)
Patient informed Her husband is seen my another provider (not in this office)and she would like you to see him as a new patient and if ok will call back to schedule his first visit?

## 2019-04-06 NOTE — Telephone Encounter (Signed)
Agree w retesting.

## 2019-04-06 NOTE — Telephone Encounter (Signed)
Called to let the patient know ok per PCP to take on her husband on as a NP. He is currently in the hospital with COVID but will schedule once home and recovered.

## 2019-04-06 NOTE — Telephone Encounter (Signed)
Yes, Monday would be a good day if schedules work out. Ty.

## 2019-04-06 NOTE — Telephone Encounter (Signed)
Message from Lorayne Bender sent at 04/06/2019 2:29 PM EST  Summary: COVID test question   Pt states she had a negative COVID test on Monday. Pt states that her spouse tested positive at the [facility] hospital today. Pt wants to know if she needs to retest for COVID for herself.          Wants to know if should get tested again. Husband now positive, pt was tested 9 days ago. Pt scheduled for appt tomorrow at [facility].  Reason for Disposition . COVID-19 Testing, questions about  Answer Assessment - Initial Assessment Questions 1. COVID-19 CLOSE CONTACT: "Who is the person with the confirmed or suspected COVID-19 infection that you were exposed to?"     spouse 2. PLACE of CONTACT: "Where were you when you were exposed to COVID-19?" (e.g., home, school, medical waiting room; which city?)     house 3. TYPE of CONTACT: "How much contact was there?" (e.g., sitting next to, live in same house, work in same office, same building)     Live in same house 4. DURATION of CONTACT: "How long were you in contact with the COVID-19 patient?" (e.g., a few seconds, passed by person, a few minutes, 15 minutes or longer, live with the patient)     Living in same house 5. MASK: "Were you wearing a mask?" "Was the other person wearing a mask?" Note: wearing a mask reduces the risk of an  otherwise close contact.     no 6. DATE of CONTACT: "When did you have contact with a COVID-19 patient?" (e.g., how many days ago)     no 7. COMMUNITY SPREAD: "Are there lots of cases of COVID-19 (community spread) where you live?" (See public health department website, if unsure)       no 8. SYMPTOMS: "Do you have any symptoms?" (e.g., fever, cough, breathing difficulty, loss of taste or smell)     none 9. PREGNANCY OR POSTPARTUM: "Is there any chance you are pregnant?" "When was your last menstrual period?" "Did you deliver in the last 2 weeks?"     no 10. HIGH RISK: "Do you have any heart or lung problems? Do you have  a weak immune system?" (e.g., heart failure, COPD, asthma, HIV positive, chemotherapy, renal failure, diabetes mellitus, sickle cell anemia, obesity)       no 11.  TRAVEL: "Have you traveled out of the country recently?" If so, "When and where?"  Also ask about out-of-state travel, since the CDC has identified some high-risk cities for community spread in the [place].  Note: Travel becomes less relevant if there is widespread community transmission where the patient lives.       no  Protocols used: CORONAVIRUS (COVID-19) EXPOSURE-A-AH

## 2019-04-06 NOTE — Telephone Encounter (Signed)
FYI

## 2019-04-07 ENCOUNTER — Ambulatory Visit: Payer: Medicare HMO | Attending: Internal Medicine

## 2019-04-07 DIAGNOSIS — Z20822 Contact with and (suspected) exposure to covid-19: Secondary | ICD-10-CM | POA: Diagnosis not present

## 2019-04-08 LAB — NOVEL CORONAVIRUS, NAA: SARS-CoV-2, NAA: DETECTED — AB

## 2019-04-09 ENCOUNTER — Telehealth: Payer: Self-pay

## 2019-04-09 ENCOUNTER — Other Ambulatory Visit: Payer: Self-pay | Admitting: Cardiology

## 2019-04-09 NOTE — Telephone Encounter (Signed)
I called patient to discuss infusion treatment.  Left voicemail to see my chart message indicating positive Covid 19 infection.  Patient is qualified for monoclonal antibody infusion based on chart review.  Patient has history of hypertension and mild CAD. Instructed to call back on hotline #[phone] if interested.

## 2019-04-09 NOTE — Telephone Encounter (Signed)
Pt given Covid-19 positive results. Discussed mild, moderate and severe symptoms. Advised pt to call 911 for any respiratory issues and/dehydration. Discussed non test criteria for ending self isolation. Pt advised of way to manage symptoms at home and review isolation precautions especially the importance of washing hands frequently and wearing a mask when around others. Pt verbalized understanding. Will report to HD. 

## 2019-04-11 ENCOUNTER — Encounter: Payer: Self-pay | Admitting: Family Medicine

## 2019-04-11 ENCOUNTER — Other Ambulatory Visit: Payer: Self-pay

## 2019-04-11 ENCOUNTER — Other Ambulatory Visit: Payer: Medicare HMO

## 2019-04-11 ENCOUNTER — Ambulatory Visit (INDEPENDENT_AMBULATORY_CARE_PROVIDER_SITE_OTHER): Payer: Medicare HMO | Admitting: Family Medicine

## 2019-04-11 DIAGNOSIS — I10 Essential (primary) hypertension: Secondary | ICD-10-CM | POA: Diagnosis not present

## 2019-04-11 DIAGNOSIS — U071 COVID-19: Secondary | ICD-10-CM | POA: Diagnosis not present

## 2019-04-11 NOTE — Progress Notes (Signed)
Chief Complaint  Patient presents with  . Hypertension    COVID    Subjective Cynthia Montgomery is a 69 y.o. female who presents for hypertension follow up. Due to COVID-19 pandemic, we are interacting via web portal for an electronic face-to-face visit. I verified patient's ID using 2 identifiers. Patient agreed to proceed with visit via this method. Patient is at home, I am at office. Patient and I are present for visit.  She does monitor home blood pressures. Blood pressures ranging from 150-160's/80-90's on average. Higher since testing + for covid, starting to come back down.  She is compliant with medications- losartan 25 mg/d, Norvasc 5 mg/d. Patient has these side effects of medication: none She is adhering to a healthy diet overall. Current exercise: walking   Past Medical History:  Diagnosis Date  . Arthritis   . Asthma   . Cataract   . Colon polyps   . High cholesterol   . Hypertension   . Positive TB test     Review of Systems Cardiovascular: no chest pain Respiratory:  no shortness of breath  Exam No conversational dyspnea Age appropriate judgment and insight Nml affect and mood  Essential hypertension  COVID-19  1- Counseled on diet and exercise. Cont Norvasc and losartan. Add extra 25 mg qhs dose if BP >150/90. 2- Supportive care. F/u as originally scheduled.  The patient voiced understanding and agreement to the plan.  Jilda Roche [place], DO 04/11/19  11:22 AM

## 2019-04-30 ENCOUNTER — Other Ambulatory Visit: Payer: Self-pay | Admitting: Family Medicine

## 2019-05-26 ENCOUNTER — Other Ambulatory Visit: Payer: Self-pay | Admitting: Cardiology

## 2019-05-26 ENCOUNTER — Other Ambulatory Visit: Payer: Self-pay | Admitting: Family Medicine

## 2019-06-17 ENCOUNTER — Other Ambulatory Visit: Payer: Self-pay

## 2019-06-20 ENCOUNTER — Other Ambulatory Visit: Payer: Self-pay | Admitting: Family Medicine

## 2019-06-20 ENCOUNTER — Ambulatory Visit (INDEPENDENT_AMBULATORY_CARE_PROVIDER_SITE_OTHER): Payer: Medicare HMO | Admitting: Family Medicine

## 2019-06-20 ENCOUNTER — Encounter: Payer: Self-pay | Admitting: Family Medicine

## 2019-06-20 ENCOUNTER — Other Ambulatory Visit: Payer: Self-pay

## 2019-06-20 VITALS — BP 142/88 | HR 63 | Temp 95.5°F | Ht 62.0 in | Wt 200.5 lb

## 2019-06-20 DIAGNOSIS — N3281 Overactive bladder: Secondary | ICD-10-CM

## 2019-06-20 DIAGNOSIS — I10 Essential (primary) hypertension: Secondary | ICD-10-CM

## 2019-06-20 DIAGNOSIS — R350 Frequency of micturition: Secondary | ICD-10-CM

## 2019-06-20 LAB — URINALYSIS, ROUTINE W REFLEX MICROSCOPIC
Bilirubin Urine: NEGATIVE
Hgb urine dipstick: NEGATIVE
Ketones, ur: NEGATIVE
Leukocytes,Ua: NEGATIVE
Nitrite: NEGATIVE
RBC / HPF: NONE SEEN (ref 0–?)
Specific Gravity, Urine: 1.02 (ref 1.000–1.030)
Total Protein, Urine: NEGATIVE
Urine Glucose: NEGATIVE
Urobilinogen, UA: 0.2 (ref 0.0–1.0)
WBC, UA: NONE SEEN (ref 0–?)
pH: 8 (ref 5.0–8.0)

## 2019-06-20 MED ORDER — LOSARTAN POTASSIUM 100 MG PO TABS: 100.0000 mg | ORAL_TABLET | Freq: Every day | ORAL | 2 refills | Status: DC

## 2019-06-20 MED ORDER — OXYBUTYNIN CHLORIDE ER 10 MG PO TB24: 10.0000 mg | ORAL_TABLET | Freq: Every day | ORAL | 2 refills | Status: DC

## 2019-06-20 MED ORDER — METOPROLOL SUCCINATE ER 50 MG PO TB24: 50.0000 mg | ORAL_TABLET | Freq: Every day | ORAL | 3 refills | Status: DC

## 2019-06-20 NOTE — Progress Notes (Signed)
Chief Complaint  Patient presents with  . Follow-up    Subjective Cynthia Montgomery is a 69 y.o. female who presents for hypertension follow up. She does monitor home blood pressures. Blood pressures ranging from 130-140's/70-80's on average. She is compliant with medications- Norvasc 5 mg/d, Losartan 50 mg/d, Bystolic 5 mg/d. Patient has these side effects of medication: none She is sometimes adhering to a healthy diet overall. Current exercise: walking, cycling, wt resistance exercise    Over past mo, has been having freq urination. BM's daily or every other day. No pain or bleeding. No incontinence.   Past Medical History:  Diagnosis Date  . Arthritis   . Asthma   . Cataract   . Colon polyps   . High cholesterol   . Hypertension   . Positive TB test     Review of Systems Cardiovascular: no chest pain Respiratory:  no shortness of breath  Exam BP (!) 142/88 (BP Location: Left Arm, Patient Position: Sitting, Cuff Size: Normal)   Pulse 63   Temp (!) 95.5 F (35.3 C) (Temporal)   Ht 5\' 2"  (1.575 m)   Wt 200 lb 8 oz (90.9 kg)   SpO2 95%   BMI 36.67 kg/m  General:  well developed, well nourished, in no apparent distress Heart: RRR, no bruits, no LE edema Lungs: clear to auscultation, no accessory muscle use Abd: BS+, S, NT, ND Psych: well oriented with normal range of affect and appropriate judgment/insight  Essential hypertension - Plan: losartan (COZAAR) 100 MG tablet, metoprolol succinate (TOPROL-XL) 50 MG 24 hr tablet  OAB (overactive bladder)  1- Change Bystolic to Toprol 2/2 cost. Increase losartan from 50 mg/d to 100 mg/d. Take Norvasc at night. Counseled on diet and exercise. 2- Start Ditropan, avoid Myrbetriq 2/2 BP. Ck UA today. F/u in 1 mo. The patient voiced understanding and agreement to the plan.  [place], DO 06/20/19  7:44 AM

## 2019-06-20 NOTE — Patient Instructions (Addendum)
Keep the diet clean and stay active.  Mind caffeine/alcohol intake.   Make sure your bowel movements are normal.   Take Norvasc at night. Take the new metoprolol and new dose of losartan in the morning.   Let us know if you need anything.

## 2019-06-21 LAB — URINE CULTURE
MICRO NUMBER:: 10302201
SPECIMEN QUALITY:: ADEQUATE

## 2019-07-04 ENCOUNTER — Other Ambulatory Visit: Payer: Self-pay | Admitting: Family Medicine

## 2019-07-19 ENCOUNTER — Other Ambulatory Visit: Payer: Self-pay

## 2019-07-19 ENCOUNTER — Ambulatory Visit (INDEPENDENT_AMBULATORY_CARE_PROVIDER_SITE_OTHER): Payer: Medicare Other | Admitting: Family Medicine

## 2019-07-19 ENCOUNTER — Encounter: Payer: Self-pay | Admitting: Family Medicine

## 2019-07-19 VITALS — BP 118/80 | HR 70 | Temp 97.0°F | Ht 62.0 in | Wt 196.4 lb

## 2019-07-19 DIAGNOSIS — N3281 Overactive bladder: Secondary | ICD-10-CM

## 2019-07-19 DIAGNOSIS — I10 Essential (primary) hypertension: Secondary | ICD-10-CM

## 2019-07-19 HISTORY — DX: Overactive bladder: N32.81

## 2019-07-19 MED ORDER — ALBUTEROL SULFATE HFA 108 (90 BASE) MCG/ACT IN AERS: 2.0000 | INHALATION_SPRAY | Freq: Four times a day (QID) | RESPIRATORY_TRACT | 2 refills | Status: DC | PRN

## 2019-07-19 NOTE — Patient Instructions (Addendum)
Consider GoodRx.com for local pricing for medicines.   Keep the diet clean and stay active.  Keep checking your blood pressure at home to see if it keeps getting lower.   Let us know if you need anything.

## 2019-07-19 NOTE — Progress Notes (Signed)
Chief Complaint  Patient presents with  . Hypertension    medication    Subjective Cynthia Montgomery is a 69 y.o. female who presents for hypertension follow up. She does not monitor home blood pressures. She is compliant with medications- losartan 100 mg/d, Norvasc 5 mg/d, Troprol XL 100 mg/d. Patient has these side effects of medication: none She is adhering to a healthy diet overall. Current exercise: walking, wt resistance exercising, core exercises, stretching  OAB Started on Ditropan 1 mo ago. Compliant, no AE's. Reports good improvement of decreased urination. Content w currnt dosage.    Past Medical History:  Diagnosis Date  . Arthritis   . Asthma   . Cataract   . Colon polyps   . High cholesterol   . Hypertension   . Positive TB test    Exam BP 118/80 (BP Location: Left Arm, Patient Position: Sitting, Cuff Size: Normal)   Pulse 70   Temp (!) 97 F (36.1 C) (Temporal)   Ht 5\' 2"  (1.575 m)   Wt 196 lb 6 oz (89.1 kg)   SpO2 96%   BMI 35.92 kg/m  General:  well developed, well nourished, in no apparent distress Heart: RRR, no bruits, no LE edema Lungs: clear to auscultation, no accessory muscle use Abd: BS+, S, NT, ND Psych: well oriented with normal range of affect and appropriate judgment/insight  Essential hypertension  OAB (overactive bladder)  1- Cont meds. Counseled on diet and exercise. 2- Cont Ditropan. F/u in 3 mo for CPE. The patient voiced understanding and agreement to the plan.  [place], DO 07/19/19  8:15 AM

## 2019-08-19 ENCOUNTER — Other Ambulatory Visit: Payer: Self-pay | Admitting: Family Medicine

## 2019-08-26 DIAGNOSIS — M1710 Unilateral primary osteoarthritis, unspecified knee: Secondary | ICD-10-CM

## 2019-08-26 DIAGNOSIS — M549 Dorsalgia, unspecified: Secondary | ICD-10-CM | POA: Insufficient documentation

## 2019-08-26 DIAGNOSIS — R0683 Snoring: Secondary | ICD-10-CM

## 2019-08-26 DIAGNOSIS — Z720 Tobacco use: Secondary | ICD-10-CM | POA: Insufficient documentation

## 2019-08-26 DIAGNOSIS — E669 Obesity, unspecified: Secondary | ICD-10-CM

## 2019-08-26 DIAGNOSIS — M179 Osteoarthritis of knee, unspecified: Secondary | ICD-10-CM

## 2019-08-26 DIAGNOSIS — G8929 Other chronic pain: Secondary | ICD-10-CM

## 2019-08-26 DIAGNOSIS — E66812 Obesity, class 2: Secondary | ICD-10-CM

## 2019-08-26 HISTORY — DX: Other chronic pain: G89.29

## 2019-08-26 HISTORY — DX: Obesity, class 2: E66.812

## 2019-08-26 HISTORY — DX: Snoring: R06.83

## 2019-08-26 HISTORY — DX: Tobacco use: Z72.0

## 2019-08-26 HISTORY — DX: Unilateral primary osteoarthritis, unspecified knee: M17.10

## 2019-08-26 HISTORY — DX: Obesity, unspecified: E66.9

## 2019-08-26 HISTORY — DX: Osteoarthritis of knee, unspecified: M17.9

## 2019-08-28 NOTE — Progress Notes (Signed)
Cardiology Office Note:    Date:  08/29/2019   ID:  Cynthia Montgomery, DOB 1950-09-08, MRN [id number]  PCP:  Sharlene Dory, DO  Cardiologist:  Norman Herrlich, MD    Referring MD: Sharlene Dory*    ASSESSMENT:    1. Mild CAD   2. Hyperlipidemia, unspecified hyperlipidemia type   3. Essential hypertension   4. RBBB    PLAN:    In order of problems listed above:  1. Stable [place] Heart Association class I continue medical therapy including aspirin calcium channel blocker beta-blocker and statin.  At this time having no angina with physical effort and on think she requires a repeat ischemia evaluation 2. Stable continue her statin check liver function lipid profile for safety and efficacy 3. BP at target continue treatment including ARB 4. Stable EKG pattern   Next appointment: 1 year   Medication Adjustments/Labs and Tests Ordered: Current medicines are reviewed at length with the patient today.  Concerns regarding medicines are outlined above.  No orders of the defined types were placed in this encounter.  No orders of the defined types were placed in this encounter.   Chief Complaint  Patient presents with   Follow-up   Coronary Artery Disease   Hyperlipidemia    History of Present Illness:    Cynthia Montgomery is a 69 y.o. female with a hx of hypertension, RBBB and hyperlipidemia referred after having an  abnormal myocardial perfusion study 07/13/2018 with anterior ischemia EF 70%.  She was last seen 10/13/2018.  Cardiac CTA performed 09/21/2018 showed a calcium score of 50 which was 67th percentile for age and sex and mild nonobstructive CAD in the proximal left anterior descending coronary artery. Compliance with diet, lifestyle and medications: Yes  She has rare episodes of nonexertional chest pain.  She has taken 1 or 2 nitroglycerin since her last visit.  She goes to the gym exercises does garden work and does not have exertional chest pain  shortness of breath palpitation or syncope and tolerates her statin without muscle pain or weakness.  EKG in my office today shows sinus rhythm incomplete right bundle branch block. Past Medical History:  Diagnosis Date   Abnormal myocardial perfusion study 07/22/2018   Acute pharyngitis 09/26/2017   Adiposity 09/26/2013   AKI (acute kidney injury) (HCC) 01/24/2017   Arthritis    Arthrofibrosis of knee joint, right 10/11/2013   Asthma    Cataract    Chronic back pain 08/26/2019   Colon polyps    Edema, unspecified 09/26/2013   Essential hypertension 04/06/2007   Qualifier: Diagnosis of  By: Noel Gerold MD, Nelda Severe of this note might be different from the original. Dx 30 ys ago; normal BP 130/60 (with med compliance)   Exertional chest pain 07/22/2018   Facet arthropathy, lumbosacral 06/15/2015   Gastro-esophageal reflux disease without esophagitis 09/26/2013   Gonalgia 09/26/2013   Heart murmur 02/27/2016   High cholesterol    Hypertension    Hypokalemia 09/26/2013   Left knee pain 04/06/2007   Qualifier: Diagnosis of  By: Noel Gerold MD, Jonathan     Left thyroid nodule 10/28/2017   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   Nicotine dependence, cigarettes, in remission 02/27/2016   Nonintractable episodic headache 07/27/2017   Nuclear sclerotic cataract of left eye 07/13/2014   OA (osteoarthritis) of knee 08/26/2019   Formatting of this note might be different from the original. s/p left TKR 09/2010   OAB (overactive bladder) 07/19/2019   Obesity, Class  II, BMI 35-39.9 08/26/2019   Other intervertebral disc degeneration, 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   Painful total knee replacement, right (HCC) 08/26/2016   Positive TB test    Primary osteoarthritis of right knee 07/19/2013   Pure hypercholesterolemia, unspecified 04/06/2007   Annotation: reports a history, and that she was on a medication for it Qualifier: Diagnosis of  By: Noel Gerold MD, Jonathan     RBBB 07/22/2018   Snoring 08/26/2019    Formatting of this note might be different from the original. STOP BANG 4   Status post total right knee replacement 12/15/2013   Syncope 01/24/2017   Tobacco abuse 08/26/2019   Formatting of this note might be different from the original. smoked x 10 yrs, quit 1985   Transaminitis 01/24/2017   Unspecified osteoarthritis, unspecified site 09/26/2013   Xerostomia 10/28/2017    Past Surgical History:  Procedure Laterality Date   CATARACT EXTRACTION, BILATERAL  2018   KNEE SURGERY     TUBAL LIGATION      Current Medications: Current Meds  Medication Sig   albuterol (VENTOLIN HFA) 108 (90 Base) MCG/ACT inhaler Inhale 2 puffs into the lungs every 6 (six) hours as needed for wheezing or shortness of breath.   amLODipine (NORVASC) 5 MG tablet Take 1 tablet (5 mg total) by mouth daily.   aspirin EC 81 MG tablet Take 81 mg by mouth daily.   baclofen (LIORESAL) 10 MG tablet Take 1 tablet by mouth twice daily   cholecalciferol (VITAMIN D) 400 units TABS tablet Take 400 Units by mouth daily.   Coenzyme Q10 (CO Q 10) 100 MG CAPS Take 1 capsule by mouth daily.   losartan (COZAAR) 100 MG tablet Take 1 tablet (100 mg total) by mouth daily.   metoprolol succinate (TOPROL-XL) 50 MG 24 hr tablet Take 1 tablet (50 mg total) by mouth daily. Take with or immediately following a meal.   Multiple Vitamin (MULTIVITAMIN) tablet Take 1 tablet by mouth daily.   Omega-3 Fatty Acids (FISH OIL) 1000 MG CAPS Take 1 capsule by mouth daily.   oxybutynin (DITROPAN-XL) 10 MG 24 hr tablet Take 1 tablet (10 mg total) by mouth at bedtime.     Allergies:   Patient has no known allergies.   Social History   Socioeconomic History   Marital status: Married    Spouse name: Not on file   Number of children: Not on file   Years of education: Not on file   Highest education level: Not on file  Occupational History   Not on file  Tobacco Use   Smoking status: Former Smoker    Types: Cigarettes    Smokeless tobacco: Never Used   Tobacco comment: quit 30 years ago  Substance and Sexual Activity   Alcohol use: No   Drug use: No   Sexual activity: Yes    Birth control/protection: None  Other Topics Concern   Not on file  Social History Narrative   Not on file   Social Determinants of Health   Financial Resource Strain:    Difficulty of Paying Living Expenses:   Food Insecurity:    Worried About Programme researcher, broadcasting/film/video in the Last Year:    Barista in the Last Year:   Transportation Needs:    Freight forwarder (Medical):    Lack of Transportation (Non-Medical):   Physical Activity:    Days of Exercise per Week:    Minutes of Exercise per Session:   Stress:  Feeling of Stress :   Social Connections:    Frequency of Communication with Friends and Family:    Frequency of Social Gatherings with Friends and Family:    Attends Religious Services:    Active Member of Clubs or Organizations:    Attends Music therapist:    Marital Status:      Family History: The patient's family history includes Arthritis in her daughter and mother; Asthma in her daughter; Cancer in her father; Diabetes in her sister; Hypertension in her mother and sister; Kidney disease in her daughter. ROS:   Please see the history of present illness.    All other systems reviewed and are negative.  EKGs/Labs/Other Studies Reviewed:    The following studies were reviewed today:  EKG:  EKG ordered today and personally reviewed.  The ekg ordered today demonstrates sinus rhythm left axis deviation incomplete right bundle branch block  Recent Labs: 09/16/2018: ALT 21; BUN 17; Creatinine, Ser 0.89; Potassium 3.6; Sodium 139  Recent Lipid Panel    Component Value Date/Time   CHOL 168 09/16/2018 1046   TRIG 66.0 09/16/2018 1046   HDL 70.20 09/16/2018 1046   CHOLHDL 2 09/16/2018 1046   VLDL 13.2 09/16/2018 1046   LDLCALC 85 09/16/2018 1046    Physical Exam:     VS:  BP 134/78 (BP Location: Right Arm, Patient Position: Sitting, Cuff Size: Normal)    Pulse 62    Ht 5\' 2"  (1.575 m)    Wt 197 lb 6.4 oz (89.5 kg)    SpO2 97%    BMI 36.10 kg/m     Wt Readings from Last 3 Encounters:  08/29/19 197 lb 6.4 oz (89.5 kg)  07/19/19 196 lb 6 oz (89.1 kg)  06/20/19 200 lb 8 oz (90.9 kg)     GEN:  Well nourished, well developed in no acute distress HEENT: Normal NECK: No JVD; No carotid bruits LYMPHATICS: No lymphadenopathy CARDIAC: RRR, no murmurs, rubs, gallops RESPIRATORY:  Clear to auscultation without rales, wheezing or rhonchi  ABDOMEN: Soft, non-tender, non-distended MUSCULOSKELETAL:  No edema; No deformity  SKIN: Warm and dry NEUROLOGIC:  Alert and oriented x 3 PSYCHIATRIC:  Normal affect    Signed, Shirlee More, MD  08/29/2019 10:14 AM    [place]

## 2019-08-29 ENCOUNTER — Other Ambulatory Visit: Payer: Self-pay

## 2019-08-29 ENCOUNTER — Encounter: Payer: Self-pay | Admitting: Cardiology

## 2019-08-29 ENCOUNTER — Ambulatory Visit (INDEPENDENT_AMBULATORY_CARE_PROVIDER_SITE_OTHER): Payer: Medicare Other | Admitting: Cardiology

## 2019-08-29 VITALS — BP 134/78 | HR 62 | Ht 62.0 in | Wt 197.4 lb

## 2019-08-29 DIAGNOSIS — E785 Hyperlipidemia, unspecified: Secondary | ICD-10-CM

## 2019-08-29 DIAGNOSIS — I10 Essential (primary) hypertension: Secondary | ICD-10-CM | POA: Diagnosis not present

## 2019-08-29 DIAGNOSIS — I4510 Unspecified right bundle-branch block: Secondary | ICD-10-CM | POA: Diagnosis not present

## 2019-08-29 DIAGNOSIS — I2510 Atherosclerotic heart disease of native coronary artery without angina pectoris: Secondary | ICD-10-CM

## 2019-08-29 NOTE — Patient Instructions (Signed)
Medication Instructions:  Your physician recommends that you continue on your current medications as directed. Please refer to the Current Medication list given to you today.  *If you need a refill on your cardiac medications before your next appointment, please call your pharmacy*   Lab Work: Your physician recommends that you return for lab work in: TODAY CMP, lipids  If you have labs (blood work) drawn today and your tests are completely normal, you will receive your results only by: MyChart Message (if you have MyChart) OR A paper copy in the mail If you have any lab test that is abnormal or we need to change your treatment, we will call you to review the results.   Testing/Procedures: None   Follow-Up: At [facility], you and your health needs are our priority.  As part of our continuing mission to provide you with exceptional heart care, we have created designated Provider Care Teams.  These Care Teams include your primary Cardiologist (physician) and Advanced Practice Providers (APPs -  Physician Assistants and Nurse Practitioners) who all work together to provide you with the care you need, when you need it.  We recommend signing up for the patient portal called "MyChart".  Sign up information is provided on this After Visit Summary.  MyChart is used to connect with patients for Virtual Visits (Telemedicine).  Patients are able to view lab/test results, encounter notes, upcoming appointments, etc.  Non-urgent messages can be sent to your provider as well.   To learn more about what you can do with MyChart, go to [email or web address].    Your next appointment:   1 year(s)  The format for your next appointment:   In Person  Provider:   Brian Munley, MD   Other Instructions   

## 2019-08-30 ENCOUNTER — Telehealth: Payer: Self-pay

## 2019-08-30 LAB — COMPREHENSIVE METABOLIC PANEL
ALT: 28 IU/L (ref 0–32)
AST: 23 IU/L (ref 0–40)
Albumin/Globulin Ratio: 1.4 (ref 1.2–2.2)
Albumin: 4 g/dL (ref 3.8–4.8)
Alkaline Phosphatase: 79 IU/L (ref 48–121)
BUN/Creatinine Ratio: 14 (ref 12–28)
BUN: 15 mg/dL (ref 8–27)
Bilirubin Total: 0.2 mg/dL (ref 0.0–1.2)
CO2: 24 mmol/L (ref 20–29)
Calcium: 9.6 mg/dL (ref 8.7–10.3)
Chloride: 107 mmol/L — ABNORMAL HIGH (ref 96–106)
Creatinine, Ser: 1.07 mg/dL — ABNORMAL HIGH (ref 0.57–1.00)
GFR calc Af Amer: 61 mL/min/{1.73_m2} (ref >59)
GFR calc non Af Amer: 53 mL/min/{1.73_m2} — ABNORMAL LOW (ref >59)
Globulin, Total: 2.8 g/dL (ref 1.5–4.5)
Glucose: 84 mg/dL (ref 65–99)
Potassium: 4.1 mmol/L (ref 3.5–5.2)
Sodium: 144 mmol/L (ref 134–144)
Total Protein: 6.8 g/dL (ref 6.0–8.5)

## 2019-08-30 LAB — LIPID PANEL
Chol/HDL Ratio: 2.5 ratio (ref 0.0–4.4)
Cholesterol, Total: 170 mg/dL (ref 100–199)
HDL: 68 mg/dL (ref >39)
LDL Chol Calc (NIH): 85 mg/dL (ref 0–99)
Triglycerides: 95 mg/dL (ref 0–149)
VLDL Cholesterol Cal: 17 mg/dL (ref 5–40)

## 2019-08-30 NOTE — Telephone Encounter (Signed)
-----   Message from Baldo Daub, MD sent at 08/30/2019  7:34 AM EDT ----- Normal or stable result  No changes

## 2019-08-30 NOTE — Telephone Encounter (Signed)
Spoke with patient regarding results and recommendation.  Patient verbalizes understanding and is agreeable to plan of care. Advised patient to call back with any issues or concerns.  

## 2019-09-16 ENCOUNTER — Other Ambulatory Visit: Payer: Self-pay | Admitting: Family Medicine

## 2019-09-16 DIAGNOSIS — I10 Essential (primary) hypertension: Secondary | ICD-10-CM

## 2019-10-06 ENCOUNTER — Other Ambulatory Visit: Payer: Self-pay | Admitting: Cardiology

## 2019-10-13 ENCOUNTER — Other Ambulatory Visit: Payer: Self-pay | Admitting: Family Medicine

## 2019-10-13 DIAGNOSIS — I10 Essential (primary) hypertension: Secondary | ICD-10-CM

## 2019-10-18 ENCOUNTER — Telehealth: Payer: Self-pay | Admitting: Family Medicine

## 2019-10-18 ENCOUNTER — Other Ambulatory Visit: Payer: Self-pay | Admitting: Family Medicine

## 2019-10-18 ENCOUNTER — Ambulatory Visit (INDEPENDENT_AMBULATORY_CARE_PROVIDER_SITE_OTHER): Payer: Medicare Other | Admitting: Family Medicine

## 2019-10-18 ENCOUNTER — Encounter: Payer: Self-pay | Admitting: Family Medicine

## 2019-10-18 ENCOUNTER — Other Ambulatory Visit: Payer: Self-pay

## 2019-10-18 VITALS — BP 120/84 | HR 64 | Temp 98.2°F | Ht 62.0 in | Wt 199.5 lb

## 2019-10-18 DIAGNOSIS — Z0000 Encounter for general adult medical examination without abnormal findings: Secondary | ICD-10-CM | POA: Diagnosis not present

## 2019-10-18 LAB — COMPREHENSIVE METABOLIC PANEL
ALT: 24 U/L (ref 0–35)
AST: 21 U/L (ref 0–37)
Albumin: 4 g/dL (ref 3.5–5.2)
Alkaline Phosphatase: 70 U/L (ref 39–117)
BUN: 18 mg/dL (ref 6–23)
CO2: 29 mEq/L (ref 19–32)
Calcium: 10.8 mg/dL — ABNORMAL HIGH (ref 8.4–10.5)
Chloride: 103 mEq/L (ref 96–112)
Creatinine, Ser: 1.07 mg/dL (ref 0.40–1.20)
GFR: 61.48 mL/min (ref >60.00)
Glucose, Bld: 95 mg/dL (ref 70–99)
Potassium: 4 mEq/L (ref 3.5–5.1)
Sodium: 138 mEq/L (ref 135–145)
Total Bilirubin: 0.3 mg/dL (ref 0.2–1.2)
Total Protein: 7 g/dL (ref 6.0–8.3)

## 2019-10-18 LAB — CBC
HCT: 37.8 % (ref 36.0–46.0)
Hemoglobin: 12.7 g/dL (ref 12.0–15.0)
MCHC: 33.7 g/dL (ref 30.0–36.0)
MCV: 91 fl (ref 78.0–100.0)
Platelets: 233 10*3/uL (ref 150.0–400.0)
RBC: 4.15 Mil/uL (ref 3.87–5.11)
RDW: 14.2 % (ref 11.5–15.5)
WBC: 4.7 10*3/uL (ref 4.0–10.5)

## 2019-10-18 LAB — LIPID PANEL
Cholesterol: 177 mg/dL (ref 0–200)
HDL: 68.8 mg/dL (ref >39.00)
LDL Cholesterol: 97 mg/dL (ref 0–99)
NonHDL: 108.52
Total CHOL/HDL Ratio: 3
Triglycerides: 58 mg/dL (ref 0.0–149.0)
VLDL: 11.6 mg/dL (ref 0.0–40.0)

## 2019-10-18 MED ORDER — MELOXICAM 7.5 MG PO TABS: 7.5000 mg | ORAL_TABLET | Freq: Every day | ORAL | 0 refills | Status: DC

## 2019-10-18 MED ORDER — AMLODIPINE BESYLATE 5 MG PO TABS: 5.0000 mg | ORAL_TABLET | Freq: Every day | ORAL | 2 refills | Status: DC

## 2019-10-18 NOTE — Telephone Encounter (Signed)
Patient states she needs pain medication . Patient seen by Carmelia Roller this morning.

## 2019-10-18 NOTE — Telephone Encounter (Signed)
Refill done.  

## 2019-10-18 NOTE — Progress Notes (Signed)
Chief Complaint  Patient presents with  . Follow-up  . Hip Pain    right     Well Woman Cynthia Montgomery is here for a complete physical.   Her last physical was >1 year ago.  Current diet: in general, a "healthy" diet. Current exercise: wt lifting cardio. Weight is stable and she denies daytime fatigue. Seatbelt? Yes  Health Maintenance Colonoscopy- Yes Shingrix- No Lung cancer screening- Yes DEXA- Yes- 2017 Mammogram- Yes Tetanus- Yes Pneumonia- Yes Hep C screen- Yes  Past Medical History:  Diagnosis Date  . Abnormal myocardial perfusion study 07/22/2018  . Acute pharyngitis 09/26/2017  . Adiposity 09/26/2013  . AKI (acute kidney injury) (HCC) 01/24/2017  . Arthritis   . Arthrofibrosis of knee joint, right 10/11/2013  . Asthma   . Cataract   . Chronic back pain 08/26/2019  . Colon polyps   . Edema, unspecified 09/26/2013  . Essential hypertension 04/06/2007   Qualifier: Diagnosis of  By: Noel Gerold MD, Nelda Severe of this note might be different from the original. Dx 30 ys ago; normal BP 130/60 (with med compliance)  . Exertional chest pain 07/22/2018  . Facet arthropathy, lumbosacral 06/15/2015  . Gastro-esophageal reflux disease without esophagitis 09/26/2013  . Gonalgia 09/26/2013  . Heart murmur 02/27/2016  . High cholesterol   . Hypertension   . Hypokalemia 09/26/2013  . Left knee pain 04/06/2007   Qualifier: Diagnosis of  By: Noel Gerold MD, Christiane Ha    . Left thyroid nodule 10/28/2017  . 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 Nonintractable episodic headache 07/27/2017  . Nuclear sclerotic cataract of left eye 07/13/2014  . OA (osteoarthritis) of knee 08/26/2019   Formatting of this note might be different from the original. s/p left TKR 09/2010  . OAB (overactive bladder) 07/19/2019  . Obesity, Class II, BMI 35-39.9 08/26/2019  . Other intervertebral disc degeneration, 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  . Painful total knee replacement, right  (HCC) 08/26/2016  . Positive TB test   . Primary osteoarthritis of right knee 07/19/2013  . Pure hypercholesterolemia, unspecified 04/06/2007   Annotation: reports a history, and that she was on a medication for it Qualifier: Diagnosis of  By: Noel Gerold MD, Christiane Ha    . RBBB 07/22/2018  . Snoring 08/26/2019   Formatting of this note might be different from the original. STOP BANG 4  . Status post total right knee replacement 12/15/2013  . Syncope 01/24/2017  . Tobacco abuse 08/26/2019   Formatting of this note might be different from the original. smoked x 10 yrs, quit 1985  . Transaminitis 01/24/2017  . Unspecified osteoarthritis, unspecified site 09/26/2013  . Xerostomia 10/28/2017     Past Surgical History:  Procedure Laterality Date  . CATARACT EXTRACTION, BILATERAL  2018  . KNEE SURGERY    . TUBAL LIGATION      Medications  Current Outpatient Medications on File Prior to Visit  Medication Sig Dispense Refill  . albuterol (VENTOLIN HFA) 108 (90 Base) MCG/ACT inhaler Inhale 2 puffs into the lungs every 6 (six) hours as needed for wheezing or shortness of breath. 18 g 2  . amLODipine (NORVASC) 5 MG tablet Take 1 tablet (5 mg total) by mouth daily. 90 tablet 2  . aspirin EC 81 MG tablet Take 81 mg by mouth daily.    . baclofen (LIORESAL) 10 MG tablet Take 1 tablet by mouth twice daily 30 tablet 0  . cholecalciferol (VITAMIN D) 400 units TABS tablet  Take 400 Units by mouth daily.    . Coenzyme Q10 (CO Q 10) 100 MG CAPS Take 1 capsule by mouth daily.    Marland Kitchen losartan (COZAAR) 100 MG tablet Take 1 tablet (100 mg total) by mouth daily. 30 tablet 1  . metoprolol succinate (TOPROL-XL) 50 MG 24 hr tablet TAKE 1 TABLET BY MOUTH ONCE DAILY. TAKE WITH OR IMMEDIATELY FOLLOWING A MEAL 30 tablet 0  . Multiple Vitamin (MULTIVITAMIN) tablet Take 1 tablet by mouth daily.    . Omega-3 Fatty Acids (FISH OIL) 1000 MG CAPS Take 1 capsule by mouth daily.    Marland Kitchen oxybutynin (DITROPAN-XL) 10 MG 24 hr tablet Take 1 tablet (10  mg total) by mouth at bedtime. 30 tablet 2  . rosuvastatin (CRESTOR) 10 MG tablet Take 1 tablet by mouth once daily 90 tablet 2  . nitroGLYCERIN (NITROSTAT) 0.4 MG SL tablet Place 1 tablet (0.4 mg total) under the tongue every 5 (five) minutes as needed for chest pain. 25 tablet 3   Allergies No Known Allergies  Review of Systems: Constitutional:  no fevers Eye:  no recent significant change in vision Ears:  No changes in hearing Nose/Mouth/Throat:  no complaints of nasal congestion, no sore throat Cardiovascular: no chest pain Respiratory:  No shortness of breath Gastrointestinal:  No change in bowel habits GU:  Female: negative for dysuria Integumentary:  no abnormal skin lesions reported Neurologic:  no headaches Endocrine:  denies unexplained weight changes  Exam BP 120/84 (BP Location: Left Arm, Patient Position: Sitting, Cuff Size: Normal)   Pulse 64   Temp 98.2 F (36.8 C) (Oral)   Ht 5\' 2"  (1.575 m)   Wt 199 lb 8 oz (90.5 kg)   SpO2 96%   BMI 36.49 kg/m  General:  well developed, well nourished, in no apparent distress Skin:  no significant moles, warts, or growths Head:  no masses, lesions, or tenderness Eyes:  pupils equal and round, sclera anicteric without injection Ears:  canals without lesions, TMs shiny without retraction, no obvious effusion, no erythema Nose:  nares patent, septum midline, mucosa normal, and no drainage or sinus tenderness Throat/Pharynx:  lips and gingiva without lesion; tongue and uvula midline; non-inflamed pharynx; no exudates or postnasal drainage Neck: neck supple without adenopathy, thyromegaly, or masses Lungs:  clear to auscultation, breath sounds equal bilaterally, no respiratory distress Cardio:  regular rate and rhythm, no bruits or LE edema Abdomen:  abdomen soft, nontender; bowel sounds normal; no masses or organomegaly Genital: Deferred Neuro:  gait normal; deep tendon reflexes normal and symmetric Psych: well oriented with  normal range of affect and appropriate judgment/insight  Assessment and Plan  Well adult exam - Plan: CBC, Comprehensive metabolic panel, Lipid panel   Well 69 y.o. female. Counseled on diet and exercise. Other orders as above. I think she has some LBP radiating ot her L hip. Will give stretches/exercises, heat, ice, and if no better in 4 weeks will refer to PT.  Follow up in 6 mo for med ck otherwise. The patient voiced understanding and agreement to the plan.  78 Oblong, DO 10/18/19 9:20 AM

## 2019-10-18 NOTE — Telephone Encounter (Signed)
Sent. TY

## 2019-10-18 NOTE — Telephone Encounter (Signed)
Medication: oxybutynin (DITROPAN-XL) 10 MG 24 hr tablet [[id number]]       Has the patient contacted their pharmacy?  (If no, request that the patient contact the pharmacy for the refill.) (If yes, when and what did the pharmacy advise?)     Preferred Pharmacy (with phone number or street name):[facility] [address] [place] - [address]  [address] [place] [address]  Phone:  [phone] Fax:  [phone]      Agent: Please be advised that RX refills may take up to 3 business days. We ask that you follow-up with your pharmacy.

## 2019-10-18 NOTE — Patient Instructions (Signed)
Give [place] 2-3 business days to get the results of your labs back.   Keep the diet clean and stay active.  The new Shingrix vaccine (for shingles) is a 2 shot series. It can make people feel low energy, achy and almost like they have the flu for 48 hours after injection. Please plan accordingly when deciding on when to get this shot. Call our office for a nurse visit appointment to get this. The second shot of the series is less severe regarding the side effects, but it still lasts 48 hours.   Gluteus Medius Syndrome Rehab It is normal to feel mild stretching, pulling, tightness, or discomfort as you do these exercises, but you should stop right away if you feel sudden pain or your pain gets worse.   Stretching and range of motion exercise This exercise warms up your muscles and joints and improves the movement and flexibility of your hip and pelvis. This exercise also helps to relieve pain and stiffness. Exercise A: Lunge (hip flexor stretch)     1. Kneel on the floor on your left / right knee. Bend your other knee so it is directly over your ankle. 2. Keep good posture with your head over your shoulders. Tuck your tailbone underneath you. This will prevent your back from arching too much. 3. You should feel a gentle stretch in the front of your thigh or hip. If you do not feel a stretch, slowly lunge forward with your chest up. 4. Hold this position for 30 seconds. 5. Slowly return to the starting position. Repeat 2 times. Complete this exercise 3 times per week. Strengthening exercises These exercises build strength and endurance in your hip and pelvis. Endurance is the ability to use your muscles for a long time, even after they get tired. Exercise B: Bridge (hip extensors)    1. Lie on your back on a firm surface with your knees bent and your feet flat on the floor. 2. Tighten your buttocks muscles and lift your bottom off the floor until the trunk of your body is level with your  thighs. ? You should feel the muscles working in your buttocks and the back of your thighs. If this exercise is too easy, cross your arms over your chest or lift one leg while your bottom is up off the floor. ? Do not arch your back. 3. Hold this position for 3 seconds. 4. Slowly lower your hips to the starting position. 5. Let your muscles relax completely between repetitions. Repeat 2 times. Complete this exercise 3 times per week. Exercise C: Straight leg raises (hip abductors)    1. Lie on your side with your left / right leg in the top position. Lie so your head, shoulder, knee, and hip line up. Bend your bottom knee to help you balance. 2. Lift your top leg up 4-6 inches (10-15 cm), keeping your toes pointed straight ahead. 3. Hold this position for 2 seconds. 4. Slowly lower your leg to the starting position and let your muscles relax completely. Repeat for a total of 10 repetitions. Repeat 2 times. Complete this exercise 3 times per week. Exercise D: Hip abductors and external rotators, quadruped 1. Get on your hands and knees on a firm, lightly padded surface. Your hands should be directly below your shoulders, and your knees should be directly below your hips. 2. Lift your left / right knee out to the side. Keep your knee bent. Do not twist your body. 3. Hold this position  for 3 seconds. 4. Slowly lower your leg. Repeat for a total of 10 repetitions.  Repeat 2 times. Complete this exercise 3 times per week. Exercise E: Single leg stand 1. Stand near a counter or door frame to hold onto as needed. It is helpful to look in a mirror for this exercise so you can watch your hip. 2. Squeeze your left / right buttock muscles then lift up your other foot. Do not let your left / righthip push out to the side. 3. Hold this position for 3 seconds. Repeat for a total of 10 repetitions. Repeat 2 times. Complete this exercise 3 times per week. Make sure you discuss any questions you have with  your health care provider. Document Released: 03/10/2005 Document Revised: 11/15/2015 Document Reviewed: 02/20/2015 [employer] Interactive Patient Education  2018 [employer]  EXERCISES  RANGE OF MOTION (ROM) AND STRETCHING EXERCISES - Low Back Pain Most people with lower back pain will find that their symptoms get worse with excessive bending forward (flexion) or arching at the lower back (extension). The exercises that will help resolve your symptoms will focus on the opposite motion.  If you have pain, numbness or tingling which travels down into your buttocks, leg or foot, the goal of the therapy is for these symptoms to move closer to your back and eventually resolve. Sometimes, these leg symptoms will get better, but your lower back pain may worsen. This is often an indication of progress in your rehabilitation. Be very alert to any changes in your symptoms and the activities in which you participated in the 24 hours prior to the change. Sharing this information with your caregiver will allow him or her to most efficiently treat your condition. These exercises may help you when beginning to rehabilitate your injury. Your symptoms may resolve with or without further involvement from your physician, physical therapist or athletic trainer. While completing these exercises, remember:   Restoring tissue flexibility helps normal motion to return to the joints. This allows healthier, less painful movement and activity.  An effective stretch should be held for at least 30 seconds.  A stretch should never be painful. You should only feel a gentle lengthening or release in the stretched tissue. FLEXION RANGE OF MOTION AND STRETCHING EXERCISES:  STRETCH - Flexion, Single Knee to Chest   Lie on a firm bed or floor with both legs extended in front of you.  Keeping one leg in contact with the floor, bring your opposite knee to your chest. Hold your leg in place by either grabbing behind your thigh or  at your knee.  Pull until you feel a gentle stretch in your low back. Hold 30 seconds.  Slowly release your grasp and repeat the exercise with the opposite side. Repeat 2 times. Complete this exercise 3 times per week.   STRETCH - Flexion, Double Knee to Chest  Lie on a firm bed or floor with both legs extended in front of you.  Keeping one leg in contact with the floor, bring your opposite knee to your chest.  Tense your stomach muscles to support your back and then lift your other knee to your chest. Hold your legs in place by either grabbing behind your thighs or at your knees.  Pull both knees toward your chest until you feel a gentle stretch in your low back. Hold 30 seconds.  Tense your stomach muscles and slowly return one leg at a time to the floor. Repeat 2 times. Complete this exercise 3  times per week.   STRETCH - Low Trunk Rotation  Lie on a firm bed or floor. Keeping your legs in front of you, bend your knees so they are both pointed toward the ceiling and your feet are flat on the floor.  Extend your arms out to the side. This will stabilize your upper body by keeping your shoulders in contact with the floor.  Gently and slowly drop both knees together to one side until you feel a gentle stretch in your low back. Hold for 30 seconds.  Tense your stomach muscles to support your lower back as you bring your knees back to the starting position. Repeat the exercise to the other side. Repeat 2 times. Complete this exercise at least 3 times per week.   EXTENSION RANGE OF MOTION AND FLEXIBILITY EXERCISES:  STRETCH - Extension, Prone on Elbows   Lie on your stomach on the floor, a bed will be too soft. Place your palms about shoulder width apart and at the height of your head.  Place your elbows under your shoulders. If this is too painful, stack pillows under your chest.  Allow your body to relax so that your hips drop lower and make contact more completely with the  floor.  Hold this position for 30 seconds.  Slowly return to lying flat on the floor. Repeat 2 times. Complete this exercise 3 times per week.   RANGE OF MOTION - Extension, Prone Press Ups  Lie on your stomach on the floor, a bed will be too soft. Place your palms about shoulder width apart and at the height of your head.  Keeping your back as relaxed as possible, slowly straighten your elbows while keeping your hips on the floor. You may adjust the placement of your hands to maximize your comfort. As you gain motion, your hands will come more underneath your shoulders.  Hold this position 30 seconds.  Slowly return to lying flat on the floor. Repeat 2 times. Complete this exercise 3 times per week.   RANGE OF MOTION- Quadruped, Neutral Spine   Assume a hands and knees position on a firm surface. Keep your hands under your shoulders and your knees under your hips. You may place padding under your knees for comfort.  Drop your head and point your tailbone toward the ground below you. This will round out your lower back like an angry cat. Hold this position for 30 seconds.  Slowly lift your head and release your tail bone so that your back sags into a large arch, like an old horse.  Hold this position for 30 seconds.  Repeat this until you feel limber in your low back.  Now, find your "sweet spot." This will be the most comfortable position somewhere between the two previous positions. This is your neutral spine. Once you have found this position, tense your stomach muscles to support your low back.  Hold this position for 30 seconds. Repeat 2 times. Complete this exercise 3 times per week.   STRENGTHENING EXERCISES - Low Back Sprain These exercises may help you when beginning to rehabilitate your injury. These exercises should be done near your "sweet spot." This is the neutral, low-back arch, somewhere between fully rounded and fully arched, that is your least painful position.  When performed in this safe range of motion, these exercises can be used for people who have either a flexion or extension based injury. These exercises may resolve your symptoms with or without further involvement from your physician,  physical therapist or athletic trainer. While completing these exercises, remember:   Muscles can gain both the endurance and the strength needed for everyday activities through controlled exercises.  Complete these exercises as instructed by your physician, physical therapist or athletic trainer. Increase the resistance and repetitions only as guided.  You may experience muscle soreness or fatigue, but the pain or discomfort you are trying to eliminate should never worsen during these exercises. If this pain does worsen, stop and make certain you are following the directions exactly. If the pain is still present after adjustments, discontinue the exercise until you can discuss the trouble with your caregiver.  STRENGTHENING - Deep Abdominals, Pelvic Tilt   Lie on a firm bed or floor. Keeping your legs in front of you, bend your knees so they are both pointed toward the ceiling and your feet are flat on the floor.  Tense your lower abdominal muscles to press your low back into the floor. This motion will rotate your pelvis so that your tail bone is scooping upwards rather than pointing at your feet or into the floor. With a gentle tension and even breathing, hold this position for 3 seconds. Repeat 2 times. Complete this exercise 3 times per week.   STRENGTHENING - Abdominals, Crunches   Lie on a firm bed or floor. Keeping your legs in front of you, bend your knees so they are both pointed toward the ceiling and your feet are flat on the floor. Cross your arms over your chest.  Slightly tip your chin down without bending your neck.  Tense your abdominals and slowly lift your trunk high enough to just clear your shoulder blades. Lifting higher can put excessive  stress on the lower back and does not further strengthen your abdominal muscles.  Control your return to the starting position. Repeat 2 times. Complete this exercise 3 times per week.   STRENGTHENING - Quadruped, Opposite UE/LE Lift   Assume a hands and knees position on a firm surface. Keep your hands under your shoulders and your knees under your hips. You may place padding under your knees for comfort.  Find your neutral spine and gently tense your abdominal muscles so that you can maintain this position. Your shoulders and hips should form a rectangle that is parallel with the floor and is not twisted.  Keeping your trunk steady, lift your right hand no higher than your shoulder and then your left leg no higher than your hip. Make sure you are not holding your breath. Hold this position for 30 seconds.  Continuing to keep your abdominal muscles tense and your back steady, slowly return to your starting position. Repeat with the opposite arm and leg. Repeat 2 times. Complete this exercise 3 times per week.   STRENGTHENING - Abdominals and Quadriceps, Straight Leg Raise   Lie on a firm bed or floor with both legs extended in front of you.  Keeping one leg in contact with the floor, bend the other knee so that your foot can rest flat on the floor.  Find your neutral spine, and tense your abdominal muscles to maintain your spinal position throughout the exercise.  Slowly lift your straight leg off the floor about 6 inches for a count of 3, making sure to not hold your breath.  Still keeping your neutral spine, slowly lower your leg all the way to the floor. Repeat this exercise with each leg 2 times. Complete this exercise 3 times per week.  POSTURE AND BODY  MECHANICS CONSIDERATIONS - Low Back Sprain Keeping correct posture when sitting, standing or completing your activities will reduce the stress put on different body tissues, allowing injured tissues a chance to heal and limiting  painful experiences. The following are general guidelines for improved posture.  While reading these guidelines, remember:  The exercises prescribed by your provider will help you have the flexibility and strength to maintain correct postures.  The correct posture provides the best environment for your joints to work. All of your joints have less wear and tear when properly supported by a spine with good posture. This means you will experience a healthier, less painful body.  Correct posture must be practiced with all of your activities, especially prolonged sitting and standing. Correct posture is as important when doing repetitive low-stress activities (typing) as it is when doing a single heavy-load activity (lifting).  RESTING POSITIONS Consider which positions are most painful for you when choosing a resting position. If you have pain with flexion-based activities (sitting, bending, stooping, squatting), choose a position that allows you to rest in a less flexed posture. You would want to avoid curling into a fetal position on your side. If your pain worsens with extension-based activities (prolonged standing, working overhead), avoid resting in an extended position such as sleeping on your stomach. Most people will find more comfort when they rest with their spine in a more neutral position, neither too rounded nor too arched. Lying on a non-sagging bed on your side with a pillow between your knees, or on your back with a pillow under your knees will often provide some relief. Keep in mind, being in any one position for a prolonged period of time, no matter how correct your posture, can still lead to stiffness.  PROPER SITTING POSTURE In order to minimize stress and discomfort on your spine, you must sit with correct posture. Sitting with good posture should be effortless for a healthy body. Returning to good posture is a gradual process. Many people can work toward this most comfortably by using  various supports until they have the flexibility and strength to maintain this posture on their own. When sitting with proper posture, your ears will fall over your shoulders and your shoulders will fall over your hips. You should use the back of the chair to support your upper back. Your lower back will be in a neutral position, just slightly arched. You may place a small pillow or folded towel at the base of your lower back for  support.  When working at a desk, create an environment that supports good, upright posture. Without extra support, muscles tire, which leads to excessive strain on joints and other tissues. Keep these recommendations in mind:  CHAIR:  A chair should be able to slide under your desk when your back makes contact with the back of the chair. This allows you to work closely.  The chair's height should allow your eyes to be level with the upper part of your monitor and your hands to be slightly lower than your elbows.  BODY POSITION  Your feet should make contact with the floor. If this is not possible, use a foot rest.  Keep your ears over your shoulders. This will reduce stress on your neck and low back.  INCORRECT SITTING POSTURES  If you are feeling tired and unable to assume a healthy sitting posture, do not slouch or slump. This puts excessive strain on your back tissues, causing more damage and pain. Healthier options include:  Using more support, like a lumbar pillow.  Switching tasks to something that requires you to be upright or walking.  Talking a brief walk.  Lying down to rest in a neutral-spine position.  PROLONGED STANDING WHILE SLIGHTLY LEANING FORWARD  When completing a task that requires you to lean forward while standing in one place for a long time, place either foot up on a stationary 2-4 inch high object to help maintain the best posture. When both feet are on the ground, the lower back tends to lose its slight inward curve. If this curve  flattens (or becomes too large), then the back and your other joints will experience too much stress, tire more quickly, and can cause pain.  CORRECT STANDING POSTURES Proper standing posture should be assumed with all daily activities, even if they only take a few moments, like when brushing your teeth. As in sitting, your ears should fall over your shoulders and your shoulders should fall over your hips. You should keep a slight tension in your abdominal muscles to brace your spine. Your tailbone should point down to the ground, not behind your body, resulting in an over-extended swayback posture.   INCORRECT STANDING POSTURES  Common incorrect standing postures include a forward head, locked knees and/or an excessive swayback. WALKING Walk with an upright posture. Your ears, shoulders and hips should all line-up.  PROLONGED ACTIVITY IN A FLEXED POSITION When completing a task that requires you to bend forward at your waist or lean over a low surface, try to find a way to stabilize 3 out of 4 of your limbs. You can place a hand or elbow on your thigh or rest a knee on the surface you are reaching across. This will provide you more stability, so that your muscles do not tire as quickly. By keeping your knees relaxed, or slightly bent, you will also reduce stress across your lower back. CORRECT LIFTING TECHNIQUES  DO :  Assume a wide stance. This will provide you more stability and the opportunity to get as close as possible to the object which you are lifting.  Tense your abdominals to brace your spine. Bend at the knees and hips. Keeping your back locked in a neutral-spine position, lift using your leg muscles. Lift with your legs, keeping your back straight.  Test the weight of unknown objects before attempting to lift them.  Try to keep your elbows locked down at your sides in order get the best strength from your shoulders when carrying an object.     Always ask for help when lifting  heavy or awkward objects. INCORRECT LIFTING TECHNIQUES DO NOT:   Lock your knees when lifting, even if it is a small object.  Bend and twist. Pivot at your feet or move your feet when needing to change directions.  Assume that you can safely pick up even a paperclip without proper posture.

## 2019-10-18 NOTE — Telephone Encounter (Signed)
Patient informed. 

## 2019-11-01 ENCOUNTER — Other Ambulatory Visit: Payer: Self-pay

## 2019-11-01 ENCOUNTER — Other Ambulatory Visit (INDEPENDENT_AMBULATORY_CARE_PROVIDER_SITE_OTHER): Payer: Medicare Other

## 2019-11-01 LAB — COMPREHENSIVE METABOLIC PANEL
ALT: 19 U/L (ref 0–35)
AST: 19 U/L (ref 0–37)
Albumin: 4 g/dL (ref 3.5–5.2)
Alkaline Phosphatase: 62 U/L (ref 39–117)
BUN: 15 mg/dL (ref 6–23)
CO2: 26 mEq/L (ref 19–32)
Calcium: 9.5 mg/dL (ref 8.4–10.5)
Chloride: 105 mEq/L (ref 96–112)
Creatinine, Ser: 1.03 mg/dL (ref 0.40–1.20)
GFR: 64.24 mL/min (ref >60.00)
Glucose, Bld: 90 mg/dL (ref 70–99)
Potassium: 3.6 mEq/L (ref 3.5–5.1)
Sodium: 139 mEq/L (ref 135–145)
Total Bilirubin: 0.3 mg/dL (ref 0.2–1.2)
Total Protein: 7.2 g/dL (ref 6.0–8.3)

## 2019-11-02 ENCOUNTER — Other Ambulatory Visit: Payer: Medicare Other

## 2019-11-02 LAB — PARATHYROID HORMONE, INTACT (NO CA): PTH: 42 pg/mL (ref 14–64)

## 2019-11-02 LAB — CALCIUM, IONIZED: Calcium, Ion: 5.14 mg/dL (ref 4.8–5.6)

## 2019-11-10 ENCOUNTER — Other Ambulatory Visit: Payer: Self-pay | Admitting: Family Medicine

## 2019-11-10 DIAGNOSIS — I10 Essential (primary) hypertension: Secondary | ICD-10-CM

## 2019-11-19 ENCOUNTER — Other Ambulatory Visit: Payer: Self-pay | Admitting: Family Medicine

## 2019-11-19 DIAGNOSIS — I10 Essential (primary) hypertension: Secondary | ICD-10-CM

## 2019-12-18 ENCOUNTER — Other Ambulatory Visit: Payer: Self-pay | Admitting: Family Medicine

## 2019-12-18 DIAGNOSIS — I10 Essential (primary) hypertension: Secondary | ICD-10-CM

## 2020-01-16 ENCOUNTER — Ambulatory Visit: Payer: Medicare Other | Attending: Internal Medicine

## 2020-01-16 ENCOUNTER — Other Ambulatory Visit (HOSPITAL_BASED_OUTPATIENT_CLINIC_OR_DEPARTMENT_OTHER): Payer: Self-pay | Admitting: Internal Medicine

## 2020-01-16 DIAGNOSIS — Z23 Encounter for immunization: Secondary | ICD-10-CM

## 2020-01-16 MED FILL — FLUAD QUADRIVALENT 0.5 ML P: 0.5 | 1 days supply | Qty: 1 | Fill #0

## 2020-01-16 NOTE — Progress Notes (Signed)
   [facility]  Name:  Cynthia Montgomery    MRN: [id number] DOB: 28-May-1950  01/16/2020  Cynthia Montgomery was observed post Covid-19 immunization for 15 minutes without incident. She was provided with Vaccine Information Sheet and instruction to access the [facility]-Safe system. Vaccinated by Cynthia Montgomery.  Cynthia Montgomery was instructed to call 911 with any severe reactions post vaccine: Marland Kitchen Difficulty breathing  . Swelling of face and throat  . A fast heartbeat  . A bad rash all over body  . Dizziness and weakness

## 2020-01-20 MED FILL — PFIZER-BIONTECH COVID-19 VA: 30 | 1 days supply | Qty: 0 | Fill #0

## 2020-01-23 ENCOUNTER — Other Ambulatory Visit: Payer: Self-pay

## 2020-01-23 ENCOUNTER — Encounter: Payer: Self-pay | Admitting: Family Medicine

## 2020-01-23 ENCOUNTER — Ambulatory Visit (INDEPENDENT_AMBULATORY_CARE_PROVIDER_SITE_OTHER): Payer: Medicare Other | Admitting: Family Medicine

## 2020-01-23 VITALS — BP 128/86 | HR 63 | Temp 97.8°F | Ht 62.0 in | Wt 196.4 lb

## 2020-01-23 DIAGNOSIS — K219 Gastro-esophageal reflux disease without esophagitis: Secondary | ICD-10-CM | POA: Diagnosis not present

## 2020-01-23 DIAGNOSIS — I10 Essential (primary) hypertension: Secondary | ICD-10-CM

## 2020-01-23 DIAGNOSIS — M545 Low back pain, unspecified: Secondary | ICD-10-CM | POA: Diagnosis not present

## 2020-01-23 DIAGNOSIS — R519 Headache, unspecified: Secondary | ICD-10-CM

## 2020-01-23 DIAGNOSIS — G8929 Other chronic pain: Secondary | ICD-10-CM | POA: Diagnosis not present

## 2020-01-23 MED ORDER — PANTOPRAZOLE SODIUM 40 MG PO TBEC: 40.0000 mg | DELAYED_RELEASE_TABLET | Freq: Every day | ORAL | 1 refills | Status: DC

## 2020-01-23 MED ORDER — OXYBUTYNIN CHLORIDE ER 10 MG PO TB24
10.0000 mg | ORAL_TABLET | Freq: Every day | ORAL | 2 refills | Status: DC
Start: 2020-01-23 — End: 2020-02-06

## 2020-01-23 MED ORDER — NITROGLYCERIN 0.4 MG SL SUBL: 0.4000 mg | SUBLINGUAL_TABLET | SUBLINGUAL | 3 refills | Status: DC | PRN

## 2020-01-23 MED ORDER — AMLODIPINE BESYLATE 5 MG PO TABS
5.0000 mg | ORAL_TABLET | Freq: Every day | ORAL | 2 refills | Status: DC
Start: 2020-01-23 — End: 2020-04-02

## 2020-01-23 MED ORDER — LOSARTAN POTASSIUM 100 MG PO TABS: 100.0000 mg | ORAL_TABLET | Freq: Every day | ORAL | 2 refills | Status: DC

## 2020-01-23 NOTE — Patient Instructions (Addendum)
The only lifestyle changes that have data behind them are weight loss for the overweight/obese and elevating the head of the bed. Finding out which foods/positions are triggers is important.  Let's keep an eye on your headaches for now. Let me know if anything changes.  Heat (pad or rice pillow in microwave) over affected area, 10-15 minutes twice daily.   Ice/cold pack over area for 10-15 min twice daily.  Let us know if you need anything.  EXERCISES  RANGE OF MOTION (ROM) AND STRETCHING EXERCISES - Low Back Pain Most people with lower back pain will find that their symptoms get worse with excessive bending forward (flexion) or arching at the lower back (extension). The exercises that will help resolve your symptoms will focus on the opposite motion.  If you have pain, numbness or tingling which travels down into your buttocks, leg or foot, the goal of the therapy is for these symptoms to move closer to your back and eventually resolve. Sometimes, these leg symptoms will get better, but your lower back pain may worsen. This is often an indication of progress in your rehabilitation. Be very alert to any changes in your symptoms and the activities in which you participated in the 24 hours prior to the change. Sharing this information with your caregiver will allow him or her to most efficiently treat your condition. These exercises may help you when beginning to rehabilitate your injury. Your symptoms may resolve with or without further involvement from your physician, physical therapist or athletic trainer. While completing these exercises, remember:   Restoring tissue flexibility helps normal motion to return to the joints. This allows healthier, less painful movement and activity.  An effective stretch should be held for at least 30 seconds.  A stretch should never be painful. You should only feel a gentle lengthening or release in the stretched tissue. FLEXION RANGE OF MOTION AND STRETCHING  EXERCISES:  STRETCH - Flexion, Single Knee to Chest   Lie on a firm bed or floor with both legs extended in front of you.  Keeping one leg in contact with the floor, bring your opposite knee to your chest. Hold your leg in place by either grabbing behind your thigh or at your knee.  Pull until you feel a gentle stretch in your low back. Hold 30 seconds.  Slowly release your grasp and repeat the exercise with the opposite side. Repeat 2 times. Complete this exercise 3 times per week.   STRETCH - Flexion, Double Knee to Chest  Lie on a firm bed or floor with both legs extended in front of you.  Keeping one leg in contact with the floor, bring your opposite knee to your chest.  Tense your stomach muscles to support your back and then lift your other knee to your chest. Hold your legs in place by either grabbing behind your thighs or at your knees.  Pull both knees toward your chest until you feel a gentle stretch in your low back. Hold 30 seconds.  Tense your stomach muscles and slowly return one leg at a time to the floor. Repeat 2 times. Complete this exercise 3 times per week.   STRETCH - Low Trunk Rotation  Lie on a firm bed or floor. Keeping your legs in front of you, bend your knees so they are both pointed toward the ceiling and your feet are flat on the floor.  Extend your arms out to the side. This will stabilize your upper body by keeping your shoulders  in contact with the floor.  Gently and slowly drop both knees together to one side until you feel a gentle stretch in your low back. Hold for 30 seconds.  Tense your stomach muscles to support your lower back as you bring your knees back to the starting position. Repeat the exercise to the other side. Repeat 2 times. Complete this exercise at least 3 times per week.   EXTENSION RANGE OF MOTION AND FLEXIBILITY EXERCISES:  STRETCH - Extension, Prone on Elbows   Lie on your stomach on the floor, a bed will be too soft.  Place your palms about shoulder width apart and at the height of your head.  Place your elbows under your shoulders. If this is too painful, stack pillows under your chest.  Allow your body to relax so that your hips drop lower and make contact more completely with the floor.  Hold this position for 30 seconds.  Slowly return to lying flat on the floor. Repeat 2 times. Complete this exercise 3 times per week.   RANGE OF MOTION - Extension, Prone Press Ups  Lie on your stomach on the floor, a bed will be too soft. Place your palms about shoulder width apart and at the height of your head.  Keeping your back as relaxed as possible, slowly straighten your elbows while keeping your hips on the floor. You may adjust the placement of your hands to maximize your comfort. As you gain motion, your hands will come more underneath your shoulders.  Hold this position 30 seconds.  Slowly return to lying flat on the floor. Repeat 2 times. Complete this exercise 3 times per week.   RANGE OF MOTION- Quadruped, Neutral Spine   Assume a hands and knees position on a firm surface. Keep your hands under your shoulders and your knees under your hips. You may place padding under your knees for comfort.  Drop your head and point your tailbone toward the ground below you. This will round out your lower back like an angry cat. Hold this position for 30 seconds.  Slowly lift your head and release your tail bone so that your back sags into a large arch, like an old horse.  Hold this position for 30 seconds.  Repeat this until you feel limber in your low back.  Now, find your "sweet spot." This will be the most comfortable position somewhere between the two previous positions. This is your neutral spine. Once you have found this position, tense your stomach muscles to support your low back.  Hold this position for 30 seconds. Repeat 2 times. Complete this exercise 3 times per week.   STRENGTHENING  EXERCISES - Low Back Sprain These exercises may help you when beginning to rehabilitate your injury. These exercises should be done near your "sweet spot." This is the neutral, low-back arch, somewhere between fully rounded and fully arched, that is your least painful position. When performed in this safe range of motion, these exercises can be used for people who have either a flexion or extension based injury. These exercises may resolve your symptoms with or without further involvement from your physician, physical therapist or athletic trainer. While completing these exercises, remember:   Muscles can gain both the endurance and the strength needed for everyday activities through controlled exercises.  Complete these exercises as instructed by your physician, physical therapist or athletic trainer. Increase the resistance and repetitions only as guided.  You may experience muscle soreness or fatigue, but the pain or  discomfort you are trying to eliminate should never worsen during these exercises. If this pain does worsen, stop and make certain you are following the directions exactly. If the pain is still present after adjustments, discontinue the exercise until you can discuss the trouble with your caregiver.  STRENGTHENING - Deep Abdominals, Pelvic Tilt   Lie on a firm bed or floor. Keeping your legs in front of you, bend your knees so they are both pointed toward the ceiling and your feet are flat on the floor.  Tense your lower abdominal muscles to press your low back into the floor. This motion will rotate your pelvis so that your tail bone is scooping upwards rather than pointing at your feet or into the floor. With a gentle tension and even breathing, hold this position for 3 seconds. Repeat 2 times. Complete this exercise 3 times per week.   STRENGTHENING - Abdominals, Crunches   Lie on a firm bed or floor. Keeping your legs in front of you, bend your knees so they are both pointed  toward the ceiling and your feet are flat on the floor. Cross your arms over your chest.  Slightly tip your chin down without bending your neck.  Tense your abdominals and slowly lift your trunk high enough to just clear your shoulder blades. Lifting higher can put excessive stress on the lower back and does not further strengthen your abdominal muscles.  Control your return to the starting position. Repeat 2 times. Complete this exercise 3 times per week.   STRENGTHENING - Quadruped, Opposite UE/LE Lift   Assume a hands and knees position on a firm surface. Keep your hands under your shoulders and your knees under your hips. You may place padding under your knees for comfort.  Find your neutral spine and gently tense your abdominal muscles so that you can maintain this position. Your shoulders and hips should form a rectangle that is parallel with the floor and is not twisted.  Keeping your trunk steady, lift your right hand no higher than your shoulder and then your left leg no higher than your hip. Make sure you are not holding your breath. Hold this position for 30 seconds.  Continuing to keep your abdominal muscles tense and your back steady, slowly return to your starting position. Repeat with the opposite arm and leg. Repeat 2 times. Complete this exercise 3 times per week.   STRENGTHENING - Abdominals and Quadriceps, Straight Leg Raise   Lie on a firm bed or floor with both legs extended in front of you.  Keeping one leg in contact with the floor, bend the other knee so that your foot can rest flat on the floor.  Find your neutral spine, and tense your abdominal muscles to maintain your spinal position throughout the exercise.  Slowly lift your straight leg off the floor about 6 inches for a count of 3, making sure to not hold your breath.  Still keeping your neutral spine, slowly lower your leg all the way to the floor. Repeat this exercise with each leg 2 times. Complete this  exercise 3 times per week.  POSTURE AND BODY MECHANICS CONSIDERATIONS - Low Back Sprain Keeping correct posture when sitting, standing or completing your activities will reduce the stress put on different body tissues, allowing injured tissues a chance to heal and limiting painful experiences. The following are general guidelines for improved posture.  While reading these guidelines, remember:  The exercises prescribed by your provider will help you have  the flexibility and strength to maintain correct postures.  The correct posture provides the best environment for your joints to work. All of your joints have less wear and tear when properly supported by a spine with good posture. This means you will experience a healthier, less painful body.  Correct posture must be practiced with all of your activities, especially prolonged sitting and standing. Correct posture is as important when doing repetitive low-stress activities (typing) as it is when doing a single heavy-load activity (lifting).  RESTING POSITIONS Consider which positions are most painful for you when choosing a resting position. If you have pain with flexion-based activities (sitting, bending, stooping, squatting), choose a position that allows you to rest in a less flexed posture. You would want to avoid curling into a fetal position on your side. If your pain worsens with extension-based activities (prolonged standing, working overhead), avoid resting in an extended position such as sleeping on your stomach. Most people will find more comfort when they rest with their spine in a more neutral position, neither too rounded nor too arched. Lying on a non-sagging bed on your side with a pillow between your knees, or on your back with a pillow under your knees will often provide some relief. Keep in mind, being in any one position for a prolonged period of time, no matter how correct your posture, can still lead to stiffness.  PROPER SITTING  POSTURE In order to minimize stress and discomfort on your spine, you must sit with correct posture. Sitting with good posture should be effortless for a healthy body. Returning to good posture is a gradual process. Many people can work toward this most comfortably by using various supports until they have the flexibility and strength to maintain this posture on their own. When sitting with proper posture, your ears will fall over your shoulders and your shoulders will fall over your hips. You should use the back of the chair to support your upper back. Your lower back will be in a neutral position, just slightly arched. You may place a small pillow or folded towel at the base of your lower back for  support.  When working at a desk, create an environment that supports good, upright posture. Without extra support, muscles tire, which leads to excessive strain on joints and other tissues. Keep these recommendations in mind:  CHAIR:  A chair should be able to slide under your desk when your back makes contact with the back of the chair. This allows you to work closely.  The chair's height should allow your eyes to be level with the upper part of your monitor and your hands to be slightly lower than your elbows.  BODY POSITION  Your feet should make contact with the floor. If this is not possible, use a foot rest.  Keep your ears over your shoulders. This will reduce stress on your neck and low back.  INCORRECT SITTING POSTURES  If you are feeling tired and unable to assume a healthy sitting posture, do not slouch or slump. This puts excessive strain on your back tissues, causing more damage and pain. Healthier options include:  Using more support, like a lumbar pillow.  Switching tasks to something that requires you to be upright or walking.  Talking a brief walk.  Lying down to rest in a neutral-spine position.  PROLONGED STANDING WHILE SLIGHTLY LEANING FORWARD  When completing a task  that requires you to lean forward while standing in one place for a long  time, place either foot up on a stationary 2-4 inch high object to help maintain the best posture. When both feet are on the ground, the lower back tends to lose its slight inward curve. If this curve flattens (or becomes too large), then the back and your other joints will experience too much stress, tire more quickly, and can cause pain.  CORRECT STANDING POSTURES Proper standing posture should be assumed with all daily activities, even if they only take a few moments, like when brushing your teeth. As in sitting, your ears should fall over your shoulders and your shoulders should fall over your hips. You should keep a slight tension in your abdominal muscles to brace your spine. Your tailbone should point down to the ground, not behind your body, resulting in an over-extended swayback posture.   INCORRECT STANDING POSTURES  Common incorrect standing postures include a forward head, locked knees and/or an excessive swayback. WALKING Walk with an upright posture. Your ears, shoulders and hips should all line-up.  PROLONGED ACTIVITY IN A FLEXED POSITION When completing a task that requires you to bend forward at your waist or lean over a low surface, try to find a way to stabilize 3 out of 4 of your limbs. You can place a hand or elbow on your thigh or rest a knee on the surface you are reaching across. This will provide you more stability, so that your muscles do not tire as quickly. By keeping your knees relaxed, or slightly bent, you will also reduce stress across your lower back. CORRECT LIFTING TECHNIQUES  DO :  Assume a wide stance. This will provide you more stability and the opportunity to get as close as possible to the object which you are lifting.  Tense your abdominals to brace your spine. Bend at the knees and hips. Keeping your back locked in a neutral-spine position, lift using your leg muscles. Lift with your  legs, keeping your back straight.  Test the weight of unknown objects before attempting to lift them.  Try to keep your elbows locked down at your sides in order get the best strength from your shoulders when carrying an object.     Always ask for help when lifting heavy or awkward objects. INCORRECT LIFTING TECHNIQUES DO NOT:   Lock your knees when lifting, even if it is a small object.  Bend and twist. Pivot at your feet or move your feet when needing to change directions.  Assume that you can safely pick up even a paperclip without proper posture.

## 2020-01-23 NOTE — Progress Notes (Signed)
Chief Complaint  Patient presents with  . Follow-up    Cynthia Montgomery is a 69 y.o. female here for evaluation of bilateral headache.  Duration: 1 month  Around 5 d/week Lasts until she takes something.  Palliation: Tylenol Provocation: None Severity: 5/10 Quality: achy, throbbing Associated symptoms: photophobia Denies: Gum chewing, jaw pain, injury, nausea, vomiting, sonophobia, diplopia Currently with headache? Slightly  Taking Tylenol for pain, which does help.  No nighttime awakenings.  Heartburn Has had for several months, worse at night. Taking Tums and Pepto with minimal relief. Food and laying down make it worse. No unintentional wt loss, nighttime awakenings, bleeding.   Still having low back pain. Was not compliant with the stretches/exercises. No new s/s's. Was rx'd Baclofen and Mobic that did not help.   Past Medical History:  Diagnosis Date  . Abnormal myocardial perfusion study 07/22/2018  . Acute pharyngitis 09/26/2017  . Adiposity 09/26/2013  . AKI (acute kidney injury) (HCC) 01/24/2017  . Arthritis   . Arthrofibrosis of knee joint, right 10/11/2013  . Asthma   . Cataract   . Chronic back pain 08/26/2019  . Colon polyps   . Edema, unspecified 09/26/2013  . Essential hypertension 04/06/2007   Qualifier: Diagnosis of  By: Noel Gerold MD, Nelda Severe of this note might be different from the original. Dx 30 ys ago; normal BP 130/60 (with med compliance)  . Exertional chest pain 07/22/2018  . Facet arthropathy, lumbosacral 06/15/2015  . Gastro-esophageal reflux disease without esophagitis 09/26/2013  . Gonalgia 09/26/2013  . Heart murmur 02/27/2016  . High cholesterol   . Hypertension   . Hypokalemia 09/26/2013  . Left knee pain 04/06/2007   Qualifier: Diagnosis of  By: Noel Gerold MD, Christiane Ha    . Left thyroid nodule 10/28/2017  . 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 Nonintractable episodic headache 07/27/2017  . Nuclear  sclerotic cataract of left eye 07/13/2014  . OA (osteoarthritis) of knee 08/26/2019   Formatting of this note might be different from the original. s/p left TKR 09/2010  . OAB (overactive bladder) 07/19/2019  . Obesity, Class II, BMI 35-39.9 08/26/2019  . Other intervertebral disc degeneration, 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  . Painful total knee replacement, right (HCC) 08/26/2016  . Positive TB test   . Primary osteoarthritis of right knee 07/19/2013  . Pure hypercholesterolemia, unspecified 04/06/2007   Annotation: reports a history, and that she was on a medication for it Qualifier: Diagnosis of  By: Noel Gerold MD, Christiane Ha    . RBBB 07/22/2018  . Snoring 08/26/2019   Formatting of this note might be different from the original. STOP BANG 4  . Status post total right knee replacement 12/15/2013  . Syncope 01/24/2017  . Tobacco abuse 08/26/2019   Formatting of this note might be different from the original. smoked x 10 yrs, quit 1985  . Transaminitis 01/24/2017  . Unspecified osteoarthritis, unspecified site 09/26/2013  . Xerostomia 10/28/2017   Family History  Problem Relation Age of Onset  . Arthritis Mother   . Hypertension Mother   . Cancer Father   . Diabetes Sister   . Hypertension Sister   . Arthritis Daughter   . Asthma Daughter   . Kidney disease Daughter    BP 128/86 (BP Location: Left Arm, Patient Position: Sitting, Cuff Size: Normal)   Pulse 63   Temp 97.8 F (36.6 C) (Oral)   Ht 5\' 2"  (1.575 m)   Wt 196 lb 6 oz (89.1  kg)   SpO2 98%   BMI 35.92 kg/m  General: awake, alert, appearing stated age Eyes: PERRLA, EOMi Heart: RRR Lungs: CTAB, no accessory muscle use Neuro: CN 2-12 intact, no cerebellar signs, DTR's equal and symmetry, no clonus MSK: 5/5 strength throughout, normal gait, no TTP over posterior cervical triangle or paraspinal cervical musculature; +TTP over the distal L lumbar parasp msc and prox glute Abd: BS+, S, NT, ND Psych: Age appropriate judgment and insight, mood and affect  normal  Bilateral headache  Gastroesophageal reflux disease, unspecified whether esophagitis present - Plan: pantoprazole (PROTONIX) 40 MG tablet  Chronic bilateral low back pain without sciatica  Essential hypertension - Plan: losartan (COZAAR) 100 MG tablet  1. Watchful waiting. She has had her vision rx changed recently. Will see if that is the cause. No red flag s/s's.  2. Reflux precautions discussed. 60 d trial of PPI.  3. She will be more compliant w home stretches/exercises. Heat, ice, Tylenol. PT vs sports med if no better.  Follow up in 4 weeks. The patient voiced understanding and agreement to the plan.  Jilda Roche [place], [place] 7:48 AM 01/23/20

## 2020-02-04 ENCOUNTER — Other Ambulatory Visit: Payer: Self-pay | Admitting: Family Medicine

## 2020-02-27 ENCOUNTER — Ambulatory Visit (INDEPENDENT_AMBULATORY_CARE_PROVIDER_SITE_OTHER): Payer: Medicare Other | Admitting: Family Medicine

## 2020-02-27 ENCOUNTER — Other Ambulatory Visit: Payer: Self-pay | Admitting: Family Medicine

## 2020-02-27 ENCOUNTER — Encounter: Payer: Self-pay | Admitting: Family Medicine

## 2020-02-27 ENCOUNTER — Other Ambulatory Visit: Payer: Self-pay

## 2020-02-27 VITALS — BP 122/82 | HR 72 | Temp 98.5°F | Ht 62.0 in | Wt 196.4 lb

## 2020-02-27 DIAGNOSIS — M545 Low back pain, unspecified: Secondary | ICD-10-CM | POA: Diagnosis not present

## 2020-02-27 DIAGNOSIS — R413 Other amnesia: Secondary | ICD-10-CM | POA: Diagnosis not present

## 2020-02-27 DIAGNOSIS — G8929 Other chronic pain: Secondary | ICD-10-CM

## 2020-02-27 DIAGNOSIS — Z09 Encounter for follow-up examination after completed treatment for conditions other than malignant neoplasm: Secondary | ICD-10-CM | POA: Diagnosis not present

## 2020-02-27 LAB — COMPREHENSIVE METABOLIC PANEL
ALT: 26 U/L (ref 0–35)
AST: 22 U/L (ref 0–37)
Albumin: 4.1 g/dL (ref 3.5–5.2)
Alkaline Phosphatase: 65 U/L (ref 39–117)
BUN: 16 mg/dL (ref 6–23)
CO2: 28 mEq/L (ref 19–32)
Calcium: 9.5 mg/dL (ref 8.4–10.5)
Chloride: 103 mEq/L (ref 96–112)
Creatinine, Ser: 0.9 mg/dL (ref 0.40–1.20)
GFR: 65.19 mL/min (ref >60.00)
Glucose, Bld: 87 mg/dL (ref 70–99)
Potassium: 4.1 mEq/L (ref 3.5–5.1)
Sodium: 141 mEq/L (ref 135–145)
Total Bilirubin: 0.5 mg/dL (ref 0.2–1.2)
Total Protein: 7.2 g/dL (ref 6.0–8.3)

## 2020-02-27 LAB — VITAMIN B12: Vitamin B-12: 992 pg/mL — ABNORMAL HIGH (ref 211–911)

## 2020-02-27 LAB — TSH: TSH: 1.72 u[IU]/mL (ref 0.35–4.50)

## 2020-02-27 MED ORDER — ALBUTEROL SULFATE HFA 108 (90 BASE) MCG/ACT IN AERS: 2.0000 | INHALATION_SPRAY | Freq: Four times a day (QID) | RESPIRATORY_TRACT | 4 refills | Status: AC | PRN

## 2020-02-27 MED ORDER — CELECOXIB 100 MG PO CAPS: 100.0000 mg | ORAL_CAPSULE | Freq: Two times a day (BID) | ORAL | 2 refills | Status: DC

## 2020-02-27 NOTE — Progress Notes (Signed)
Chief Complaint  Patient presents with  . Follow-up    Subjective: Patient is a 69 y.o. female here for f/u .  1-2 mo of worsening time remembering things. Mom and aunt had dementia onset in late 79's. HS grad, did a little college. Will sometimes forget names. Remembers details from many years ago though. No behavioral changes, denies anxiety/dep. Sleeping normally for her. No unexplained weight changes or dietary adjustments.   Past Medical History:  Diagnosis Date  . Abnormal myocardial perfusion study 07/22/2018  . Acute pharyngitis 09/26/2017  . Adiposity 09/26/2013  . AKI (acute kidney injury) (HCC) 01/24/2017  . Arthritis   . Arthrofibrosis of knee joint, right 10/11/2013  . Asthma   . Cataract   . Chronic back pain 08/26/2019  . Colon polyps   . Edema, unspecified 09/26/2013  . Essential hypertension 04/06/2007   Qualifier: Diagnosis of  By: Noel Gerold MD, Nelda Severe of this note might be different from the original. Dx 30 ys ago; normal BP 130/60 (with med compliance)  . Exertional chest pain 07/22/2018  . Facet arthropathy, lumbosacral 06/15/2015  . Gastro-esophageal reflux disease without esophagitis 09/26/2013  . Gonalgia 09/26/2013  . Heart murmur 02/27/2016  . High cholesterol   . Hypertension   . Hypokalemia 09/26/2013  . Left knee pain 04/06/2007   Qualifier: Diagnosis of  By: Noel Gerold MD, Christiane Ha    . Left thyroid nodule 10/28/2017  . 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 Nonintractable episodic headache 07/27/2017  . Nuclear sclerotic cataract of left eye 07/13/2014  . OA (osteoarthritis) of knee 08/26/2019   Formatting of this note might be different from the original. s/p left TKR 09/2010  . OAB (overactive bladder) 07/19/2019  . Obesity, Class II, BMI 35-39.9 08/26/2019  . Other intervertebral disc degeneration, 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  . Painful total knee replacement, right (HCC) 08/26/2016  . Positive TB test   . Primary  osteoarthritis of right knee 07/19/2013  . Pure hypercholesterolemia, unspecified 04/06/2007   Annotation: reports a history, and that she was on a medication for it Qualifier: Diagnosis of  By: Noel Gerold MD, Christiane Ha    . RBBB 07/22/2018  . Snoring 08/26/2019   Formatting of this note might be different from the original. STOP BANG 4  . Status post total right knee replacement 12/15/2013  . Syncope 01/24/2017  . Tobacco abuse 08/26/2019   Formatting of this note might be different from the original. smoked x 10 yrs, quit 1985  . Transaminitis 01/24/2017  . Unspecified osteoarthritis, unspecified site 09/26/2013  . Xerostomia 10/28/2017    Objective: BP 122/82 (BP Location: Left Arm, Patient Position: Sitting, Cuff Size: Normal)   Pulse 72   Temp 98.5 F (36.9 C) (Oral)   Ht 5\' 2"  (1.575 m)   Wt 196 lb 6 oz (89.1 kg)   SpO2 98%   BMI 35.92 kg/m  General: Awake, appears stated age HEENT: MMM, EOMi Heart: RRR, no LE edema Lungs: CTAB, no rales, wheezes or rhonchi. No accessory muscle use Psych: Age appropriate judgment and insight, normal affect and mood, A&Ox4.   Assessment and Plan: Memory difficulties - Plan: Comprehensive metabolic panel, TSH, B12  Chronic bilateral low back pain without sciatica  Follow-up for resolved condition  1. Ck labs. Will refer to neuropsych if labs neg. Counseled on diet/exercise.  2. Cont stretches/exercises. Trial another COX-2. PT if no better.  3. GERD and HA's improved. F/u pending results for #1.  The patient voiced understanding and agreement to the plan.  Jilda Roche Pierce, DO 02/27/20  10:29 AM

## 2020-02-27 NOTE — Patient Instructions (Addendum)
Give [place] 2-3 business days to get the results of your labs back.   If labs are normal, we will get you set up with the neuropsychology team for a formal evaluation.   Heat (pad or rice pillow in microwave) over affected area, 10-15 minutes twice daily.   Ice/cold pack over area for 10-15 min twice daily.  Continue the stretches/exercises for the back. If no improvement over the next few weeks, let me know and we can try PT.  OK to take Tylenol 1000 mg (2 extra strength tabs) or 975 mg (3 regular strength tabs) every 6 hours as needed.  Keep the diet clean and stay active.   Let us know if you need anything.

## 2020-03-10 ENCOUNTER — Other Ambulatory Visit: Payer: Self-pay | Admitting: Family Medicine

## 2020-03-19 ENCOUNTER — Other Ambulatory Visit: Payer: Self-pay

## 2020-03-19 ENCOUNTER — Other Ambulatory Visit: Payer: Self-pay | Admitting: Obstetrics & Gynecology

## 2020-03-19 ENCOUNTER — Encounter: Payer: Self-pay | Admitting: Obstetrics & Gynecology

## 2020-03-19 ENCOUNTER — Ambulatory Visit (INDEPENDENT_AMBULATORY_CARE_PROVIDER_SITE_OTHER): Payer: Medicare Other | Admitting: Obstetrics & Gynecology

## 2020-03-19 ENCOUNTER — Other Ambulatory Visit (HOSPITAL_COMMUNITY)
Admission: RE | Admit: 2020-03-19 | Discharge: 2020-03-19 | Disposition: A | Discharge status: Home / Self Care | Payer: Medicare Other | Source: Ambulatory Visit | Attending: Obstetrics & Gynecology | Admitting: Obstetrics & Gynecology

## 2020-03-19 VITALS — BP 164/90 | HR 56 | Ht 62.0 in | Wt 196.0 lb

## 2020-03-19 DIAGNOSIS — Z1211 Encounter for screening for malignant neoplasm of colon: Secondary | ICD-10-CM

## 2020-03-19 DIAGNOSIS — R87810 Cervical high risk human papillomavirus (HPV) DNA test positive: Secondary | ICD-10-CM | POA: Insufficient documentation

## 2020-03-19 DIAGNOSIS — Z01419 Encounter for gynecological examination (general) (routine) without abnormal findings: Secondary | ICD-10-CM

## 2020-03-19 DIAGNOSIS — Z1151 Encounter for screening for human papillomavirus (HPV): Secondary | ICD-10-CM | POA: Insufficient documentation

## 2020-03-19 DIAGNOSIS — Z1231 Encounter for screening mammogram for malignant neoplasm of breast: Secondary | ICD-10-CM

## 2020-03-19 NOTE — Patient Instructions (Signed)
Colorectal Cancer Screening  Colorectal cancer screening is a group of tests that are used to check for colorectal cancer before symptoms develop. Colorectal refers to the colon and rectum. The colon and rectum are located at the end of the digestive tract and carry bowel movements out of the body. Who should have screening? All adults starting at age 69 until age 75 should have screening. Your health care provider may recommend screening at age 45. You will have tests every 1-10 years, depending on your results and the type of screening test. You may have screening tests starting at an earlier age, or more frequently than other people, if you have any of the following risk factors:  A personal or family history of colorectal cancer or abnormal growths (polyps).  Inflammatory bowel disease, such as ulcerative colitis or Crohn's disease.  A history of having radiation treatment to the abdomen or pelvic area for cancer.  Colorectal cancer symptoms, such as changes in bowel habits or blood in your stool.  A type of colon cancer syndrome that is passed from parent to child (hereditary), such as: ? Lynch syndrome. ? Familial adenomatous polyposis. ? Turcot syndrome. ? Peutz-Jeghers syndrome. Screening recommendations for adults who are 75-85 years old vary depending on health. How is screening done? There are several types of colorectal screening tests. You may have one or more of the following:  Guaiac-based fecal occult blood testing. For this test, a stool (feces) sample is checked for hidden (occult) blood, which could be a sign of colorectal cancer.  Fecal immunochemical test (FIT). For this test, a stool sample is checked for blood, which could be a sign of colorectal cancer.  Stool DNA test. For this test, a stool sample is checked for blood and changes in DNA that could lead to colorectal cancer.  Sigmoidoscopy. During this test, a thin, flexible tube with a camera on the end  (sigmoidoscope) is used to examine the rectum and the lower colon.  Colonoscopy. During this test, a long, flexible tube with a camera on the end (colonoscope) is used to examine the entire colon and rectum. With a colonoscopy, it is possible to take a sample of tissue (biopsy) and remove small polyps during the test.  Virtual colonoscopy. Instead of a colonoscope, this type of colonoscopy uses X-rays (CT scan) and computers to produce images of the colon and rectum. What are the benefits of screening? Screening reduces your risk for colorectal cancer and can help identify cancer at an early stage, when the cancer can be removed or treated more easily. It is common for polyps to form in the lining of the colon, especially as you age. These polyps may be cancerous or become cancerous over time. Screening can identify these polyps. What are the risks of screening? Each screening test may have different risks.  Stool sample tests have fewer risks than other types of screening tests. However, you may need more tests to confirm results from a stool sample test.  Screening tests that involve X-rays expose you to low levels of radiation, which may slightly increase your cancer risk. The benefit of detecting cancer outweighs the slight increase in risk.  Screening tests such as sigmoidoscopy and colonoscopy may place you at risk for bleeding, intestinal damage, infection, or a reaction to medicines given during the exam. Talk with your health care provider to understand your risk for colorectal cancer and to make a screening plan that is right for you. Questions to ask your health care   provider  When should I start colorectal cancer screening?  What is my risk for colorectal cancer?  How often do I need screening?  Which screening tests do I need?  How do I get my test results?  What do my results mean? Where to find more information Learn more about colorectal cancer screening from:  The  American Cancer Society: www.cancer.org  The National Cancer Institute: www.cancer.gov Summary  Colorectal cancer screening is a group of tests used to check for colorectal cancer before symptoms develop.  Screening reduces your risk for colorectal cancer and can help identify cancer at an early stage, when the cancer can be removed or treated more easily.  All adults starting at age 69 until age 75 should have screening. Your health care provider may recommend screening at age 45.  You may have screening tests starting at an earlier age, or more frequently than other people, if you have certain risk factors.  Talk with your health care provider to understand your risk for colorectal cancer and to make a screening plan that is right for you. This information is not intended to replace advice given to you by your health care provider. Make sure you discuss any questions you have with your health care provider. Document Revised: 06/30/2018 Document Reviewed: 12/10/2016 Elsevier Patient Education  2020 Elsevier Inc.  

## 2020-03-19 NOTE — Progress Notes (Signed)
Subjective:     Cynthia Montgomery is a 69 y.o. female here for a routine exam.  Current complaints: none. Pt with no complaints. She is due for a mammogram and a colonoscopy.      Gynecologic History No LMP recorded. Patient is postmenopausal. Contraception: post menopausal status Last Pap: 01/11/2018. Results were: +hrHPV Last mammogram: 01/04/2019. Results were: normal  Obstetric History OB History  Gravida Para Term Preterm AB Living  2 2 2     2   SAB IAB Ectopic Multiple Live Births          2    # Outcome Date GA Lbr Len/2nd Weight Sex Delivery Anes PTL Lv  2 Term 1972 [redacted]w[redacted]d   F Vag-Spont EPI  LIV  1 Term 33 [redacted]w[redacted]d   F Vag-Spont EPI  LIV     The following portions of the patient's history were reviewed and updated as appropriate: allergies, current medications, past family history, past medical history, past social history, past surgical history and problem list.  Review of Systems Pertinent items are noted in HPI.    Objective:  BP (!) 164/90    Pulse (!) 56    Ht 5\' 2"  (1.575 m)    Wt 196 lb (88.9 kg)    BMI 35.85 kg/m  General Appearance:    Alert, cooperative, no distress, appears stated age  Head:    Normocephalic, without obvious abnormality, atraumatic  Eyes:    conjunctiva/corneas clear, EOM's intact, both eyes  Ears:    Normal external ear canals, both ears  Nose:   Nares normal, septum midline, mucosa normal, no drainage    or sinus tenderness  Throat:   Lips, mucosa, and tongue normal; teeth and gums normal  Neck:   Supple, symmetrical, trachea midline, no adenopathy;    thyroid:  no enlargement/tenderness/nodules  Back:     Symmetric, no curvature, ROM normal, no CVA tenderness  Lungs:     respirations unlabored  Chest Wall:    No tenderness or deformity   Heart:    Regular rate and rhythm  Breast Exam:    No tenderness, masses, or nipple abnormality  Abdomen:     Soft, non-tender, bowel sounds active all four quadrants,    no masses, no organomegaly   Genitalia:    Normal female without lesion, discharge or tenderness     Extremities:   Extremities normal, atraumatic, no cyanosis or edema  Pulses:   2+ and symmetric all extremities  Skin:   Skin color, texture, turgor normal, no rashes or lesions     Assessment:    Healthy female exam.    Plan:   Diagnoses and all orders for this visit:  Well female exam with routine gynecological exam -     Cytology - PAP( [facility])  Colon cancer screening -     MM DIGITAL SCREENING BILATERAL; Future -     Ambulatory referral to Gastroenterology  Encounter for screening mammogram for malignant neoplasm of breast  f/u PAP with hr HPV F/u in 1 year or sooner prn  Harles Evetts L. Harraway-Smith, M.D., [redacted]w[redacted]d

## 2020-03-21 LAB — CYTOLOGY - PAP
Comment: NEGATIVE
Diagnosis: NEGATIVE
High risk HPV: POSITIVE — AB

## 2020-04-01 ENCOUNTER — Other Ambulatory Visit: Payer: Self-pay | Admitting: Family Medicine

## 2020-04-01 DIAGNOSIS — K219 Gastro-esophageal reflux disease without esophagitis: Secondary | ICD-10-CM

## 2020-04-08 ENCOUNTER — Other Ambulatory Visit: Payer: Self-pay | Admitting: Family Medicine

## 2020-04-14 ENCOUNTER — Other Ambulatory Visit: Payer: Self-pay

## 2020-04-16 MED ORDER — AMLODIPINE BESYLATE 5 MG PO TABS
5.0000 mg | ORAL_TABLET | Freq: Every day | ORAL | 3 refills | Status: DC
Start: 2020-04-16 — End: 2020-10-22

## 2020-04-23 ENCOUNTER — Ambulatory Visit (INDEPENDENT_AMBULATORY_CARE_PROVIDER_SITE_OTHER): Payer: Medicare Other | Admitting: Family Medicine

## 2020-04-23 ENCOUNTER — Encounter: Payer: Self-pay | Admitting: Family Medicine

## 2020-04-23 ENCOUNTER — Other Ambulatory Visit: Payer: Self-pay

## 2020-04-23 VITALS — BP 142/80 | HR 67 | Temp 97.8°F | Resp 18 | Wt 195.2 lb

## 2020-04-23 DIAGNOSIS — E785 Hyperlipidemia, unspecified: Secondary | ICD-10-CM | POA: Diagnosis not present

## 2020-04-23 DIAGNOSIS — H6981 Other specified disorders of Eustachian tube, right ear: Secondary | ICD-10-CM | POA: Diagnosis not present

## 2020-04-23 DIAGNOSIS — I10 Essential (primary) hypertension: Secondary | ICD-10-CM

## 2020-04-23 DIAGNOSIS — R413 Other amnesia: Secondary | ICD-10-CM

## 2020-04-23 DIAGNOSIS — H6991 Unspecified Eustachian tube disorder, right ear: Secondary | ICD-10-CM

## 2020-04-23 HISTORY — DX: Hyperlipidemia, unspecified: E78.5

## 2020-04-23 MED ORDER — CELECOXIB 100 MG PO CAPS
100.0000 mg | ORAL_CAPSULE | Freq: Two times a day (BID) | ORAL | 2 refills | Status: DC
Start: 2020-04-23 — End: 2021-06-11

## 2020-04-23 MED ORDER — FLUTICASONE PROPIONATE 50 MCG/ACT NA SUSP: 2.0000 | Freq: Every day | NASAL | 6 refills | Status: DC

## 2020-04-23 MED ORDER — NITROGLYCERIN 0.4 MG SL SUBL: 0.4000 mg | SUBLINGUAL_TABLET | SUBLINGUAL | 3 refills | Status: DC | PRN

## 2020-04-23 MED ORDER — OXYBUTYNIN CHLORIDE ER 10 MG PO TB24
10.0000 mg | ORAL_TABLET | Freq: Every day | ORAL | 2 refills | Status: DC
Start: 2020-04-23 — End: 2021-06-03

## 2020-04-23 NOTE — Progress Notes (Signed)
Chief Complaint  Patient presents with  . Follow-up    Medication review- Pt c/o right ear discomfort for a long time she has tried OTC drops. Refills on Oxybutynin, Nitro, and Celebrex.    Subjective Cynthia Montgomery is a 70 y.o. female who presents for hypertension follow up. She does monitor home blood pressures. Blood pressures ranging from 130-140's/80's on average. She is compliant with medications- Norvasc 5 mg/d, losartan 100 mg/d, Toprol Xl 50 mg/d. Patient has these side effects of medication: none She is sometimes adhering to a healthy diet overall. Current exercise: stretches her back No CP or SOB.   Hyperlipidemia Patient presents for hyperlipidemia follow up. Currently being treated with Crestor 10 mg/d and compliance with treatment thus far has been good. She denies myalgias. Diet/exercise as above.  R ear fullness 2-3 weeks, tried oil without relief. Decreased hearing, some popping. No drainage. No Q tips, fevers, congestion, recent illness.    Past Medical History:  Diagnosis Date  . Abnormal myocardial perfusion study 07/22/2018  . Acute pharyngitis 09/26/2017  . Adiposity 09/26/2013  . AKI (acute kidney injury) (HCC) 01/24/2017  . Arthritis   . Arthrofibrosis of knee joint, right 10/11/2013  . Asthma   . Cataract   . Chronic back pain 08/26/2019  . Colon polyps   . Edema, unspecified 09/26/2013  . Essential hypertension 04/06/2007   Qualifier: Diagnosis of  By: Noel Gerold MD, Nelda Severe of this note might be different from the original. Dx 30 ys ago; normal BP 130/60 (with med compliance)  . Exertional chest pain 07/22/2018  . Facet arthropathy, lumbosacral 06/15/2015  . Gastro-esophageal reflux disease without esophagitis 09/26/2013  . Gonalgia 09/26/2013  . Heart murmur 02/27/2016  . High cholesterol   . Hypertension   . Hypokalemia 09/26/2013  . Left knee pain 04/06/2007   Qualifier: Diagnosis of  By: Noel Gerold MD, Christiane Ha    . Left thyroid nodule 10/28/2017  .  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 Nonintractable episodic headache 07/27/2017  . Nuclear sclerotic cataract of left eye 07/13/2014  . OA (osteoarthritis) of knee 08/26/2019   Formatting of this note might be different from the original. s/p left TKR 09/2010  . OAB (overactive bladder) 07/19/2019  . Obesity, Class II, BMI 35-39.9 08/26/2019  . Other intervertebral disc degeneration, 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  . Painful total knee replacement, right (HCC) 08/26/2016  . Positive TB test   . Primary osteoarthritis of right knee 07/19/2013  . Pure hypercholesterolemia, unspecified 04/06/2007   Annotation: reports a history, and that she was on a medication for it Qualifier: Diagnosis of  By: Noel Gerold MD, Christiane Ha    . RBBB 07/22/2018  . Snoring 08/26/2019   Formatting of this note might be different from the original. STOP BANG 4  . Status post total right knee replacement 12/15/2013  . Syncope 01/24/2017  . Tobacco abuse 08/26/2019   Formatting of this note might be different from the original. smoked x 10 yrs, quit 1985  . Transaminitis 01/24/2017  . Unspecified osteoarthritis, unspecified site 09/26/2013  . Xerostomia 10/28/2017    Exam BP (!) 142/80 (BP Location: Left Arm, Patient Position: Sitting, Cuff Size: Normal)   Pulse 67   Temp 97.8 F (36.6 C) (Oral)   Resp 18   Wt 195 lb 3.2 oz (88.5 kg)   SpO2 100%   BMI 35.70 kg/m  General:  well developed, well nourished, in no apparent distress Ears: Scant wax  b/l. Canals otherwise unremarkable. TM's neg b/l.  Heart: RRR, no bruits, no LE edema Lungs: clear to auscultation, no accessory muscle use Psych: well oriented with normal range of affect and appropriate judgment/insight  Essential hypertension  Hyperlipidemia, unspecified hyperlipidemia type  ETD (Eustachian tube dysfunction), right - Plan: fluticasone (FLONASE) 50 MCG/ACT nasal spray  Memory difficulties - Plan: Ambulatory referral to  Psychology  1. Counseled on diet and exercise. Cont Norvasc 5 mg/d, losartan 100 mg/d, Toprol Xl 50 mg/d. Cont to monitor BP. Goal is <150/90/ 2. Cont Crestor 10 mg/d. 3. ETD. INCS for 2-3 weeks, ENT if no better vs PO steroid.  F/u in 6 mo for CPE or prn. The patient voiced understanding and agreement to the plan.  Jilda Roche [place], DO 04/23/20  8:21 AM

## 2020-04-23 NOTE — Patient Instructions (Addendum)
Keep the diet clean and stay active.  Aim to do some physical exertion for 150 minutes per week. This is typically divided into 5 days per week, 30 minutes per day. The activity should be enough to get your heart rate up. Anything is better than nothing if you have time constraints.  Flonase is also available over the counter. Compare prices. It can take up to 2-3 weeks to fully improve.   Let us know if you need anything.

## 2020-04-25 ENCOUNTER — Ambulatory Visit
Admission: RE | Admit: 2020-04-25 | Discharge: 2020-04-25 | Disposition: A | Discharge status: Home / Self Care | Payer: Medicare Other | Source: Ambulatory Visit | Attending: Obstetrics & Gynecology | Admitting: Obstetrics & Gynecology

## 2020-04-25 ENCOUNTER — Other Ambulatory Visit: Payer: Self-pay

## 2020-04-25 DIAGNOSIS — Z1231 Encounter for screening mammogram for malignant neoplasm of breast: Secondary | ICD-10-CM

## 2020-05-02 ENCOUNTER — Other Ambulatory Visit: Payer: Self-pay

## 2020-05-02 ENCOUNTER — Emergency Department (HOSPITAL_BASED_OUTPATIENT_CLINIC_OR_DEPARTMENT_OTHER)
Admission: EM | Admit: 2020-05-02 | Discharge: 2020-05-02 | Disposition: A | Discharge status: Home / Self Care | Payer: Medicare Other | Attending: Emergency Medicine | Admitting: Emergency Medicine

## 2020-05-02 ENCOUNTER — Encounter (HOSPITAL_BASED_OUTPATIENT_CLINIC_OR_DEPARTMENT_OTHER): Payer: Self-pay | Admitting: *Deleted

## 2020-05-02 ENCOUNTER — Emergency Department (HOSPITAL_BASED_OUTPATIENT_CLINIC_OR_DEPARTMENT_OTHER): Payer: Medicare Other

## 2020-05-02 DIAGNOSIS — Z87891 Personal history of nicotine dependence: Secondary | ICD-10-CM | POA: Diagnosis not present

## 2020-05-02 DIAGNOSIS — Z96651 Presence of right artificial knee joint: Secondary | ICD-10-CM | POA: Diagnosis not present

## 2020-05-02 DIAGNOSIS — M5459 Other low back pain: Secondary | ICD-10-CM | POA: Diagnosis not present

## 2020-05-02 DIAGNOSIS — I10 Essential (primary) hypertension: Secondary | ICD-10-CM | POA: Insufficient documentation

## 2020-05-02 DIAGNOSIS — Z79899 Other long term (current) drug therapy: Secondary | ICD-10-CM | POA: Diagnosis not present

## 2020-05-02 DIAGNOSIS — J45909 Unspecified asthma, uncomplicated: Secondary | ICD-10-CM | POA: Insufficient documentation

## 2020-05-02 DIAGNOSIS — M5442 Lumbago with sciatica, left side: Secondary | ICD-10-CM | POA: Insufficient documentation

## 2020-05-02 DIAGNOSIS — M25552 Pain in left hip: Secondary | ICD-10-CM | POA: Insufficient documentation

## 2020-05-02 DIAGNOSIS — M545 Low back pain, unspecified: Secondary | ICD-10-CM

## 2020-05-02 DIAGNOSIS — Z7982 Long term (current) use of aspirin: Secondary | ICD-10-CM | POA: Diagnosis not present

## 2020-05-02 DIAGNOSIS — N179 Acute kidney failure, unspecified: Secondary | ICD-10-CM | POA: Diagnosis not present

## 2020-05-02 MED ORDER — ACETAMINOPHEN 500 MG PO TABS
1000.0000 mg | ORAL_TABLET | Freq: Once | ORAL | Status: AC
  Administered 2020-05-02: 1000 mg via ORAL
  Filled 2020-05-02: qty 2

## 2020-05-02 MED ORDER — LIDOCAINE 5 % EX PTCH
1.0000 | MEDICATED_PATCH | CUTANEOUS | Status: DC
  Administered 2020-05-02: 1 via TRANSDERMAL
  Filled 2020-05-02: qty 1

## 2020-05-02 NOTE — Discharge Instructions (Addendum)
Take tylenol 1000mg  every 6 hours (no more than 4 doses in 24 hour period.) Use lidocaine patches on area of back that is most tender.  Use heat (bath or heating pad) several times daily. Do not sleep on heating pad. Move around as much as possible and try to avoid long periods of sitting or not moving.  RETURN TO ER IF ANY LEG WEAKNESS/NUMBNESS, BOWEL OR BLADDER PROBLEMS, OR FEVER.

## 2020-05-02 NOTE — ED Provider Notes (Signed)
[facility] EMERGENCY DEPARTMENT Provider Note   CSN: [id number] Arrival date & time: 05/02/20  4431     History Chief Complaint  Patient presents with  . Hip Pain    Cynthia Montgomery is a 70 y.o. female.  70 year old female with past medical history below including hypertension, hyperlipidemia, obesity who presents with left hip and back pain.  Patient states that she has had low back pain for a while and does stretching exercises as instructed by her PCP.  She has also had some left hip pain that is in her posterior hip/buttock that has been going on for several months but has gotten worse over the past couple of days.  Pain is worse when she tries to get up or when she sits/lays.  Pain feels better once she has been moving around for a while.  She denies any associated leg numbness or weakness, bowel or bladder incontinence, saddle anesthesia, fevers, or trauma.  No history of hip or back injury.  The history is provided by the patient.  Hip Pain       Past Medical History:  Diagnosis Date  . Abnormal myocardial perfusion study 07/22/2018  . Acute pharyngitis 09/26/2017  . Adiposity 09/26/2013  . AKI (acute kidney injury) (HCC) 01/24/2017  . Arthritis   . Arthrofibrosis of knee joint, right 10/11/2013  . Asthma   . Cataract   . Chronic back pain 08/26/2019  . Colon polyps   . Edema, unspecified 09/26/2013  . Essential hypertension 04/06/2007   Qualifier: Diagnosis of  By: Noel Gerold MD, Nelda Severe of this note might be different from the original. Dx 30 ys ago; normal BP 130/60 (with med compliance)  . Exertional chest pain 07/22/2018  . Facet arthropathy, lumbosacral 06/15/2015  . Gastro-esophageal reflux disease without esophagitis 09/26/2013  . Gonalgia 09/26/2013  . Heart murmur 02/27/2016  . High cholesterol   . Hypertension   . Hypokalemia 09/26/2013  . Left knee pain 04/06/2007   Qualifier: Diagnosis of  By: Noel Gerold MD, Christiane Ha    . Left thyroid nodule 10/28/2017  .  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 Nonintractable episodic headache 07/27/2017  . Nuclear sclerotic cataract of left eye 07/13/2014  . OA (osteoarthritis) of knee 08/26/2019   Formatting of this note might be different from the original. s/p left TKR 09/2010  . OAB (overactive bladder) 07/19/2019  . Obesity, Class II, BMI 35-39.9 08/26/2019  . Other intervertebral disc degeneration, 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  . Painful total knee replacement, right (HCC) 08/26/2016  . Positive TB test   . Primary osteoarthritis of right knee 07/19/2013  . Pure hypercholesterolemia, unspecified 04/06/2007   Annotation: reports a history, and that she was on a medication for it Qualifier: Diagnosis of  By: Noel Gerold MD, Christiane Ha    . RBBB 07/22/2018  . Snoring 08/26/2019   Formatting of this note might be different from the original. STOP BANG 4  . Status post total right knee replacement 12/15/2013  . Syncope 01/24/2017  . Tobacco abuse 08/26/2019   Formatting of this note might be different from the original. smoked x 10 yrs, quit 1985  . Transaminitis 01/24/2017  . Unspecified osteoarthritis, unspecified site 09/26/2013  . Xerostomia 10/28/2017    Patient Active Problem List   Diagnosis Date Noted  . Hyperlipidemia 04/23/2020  . Chronic back pain 08/26/2019  . Obesity, Class II, BMI 35-39.9 08/26/2019  . Snoring 08/26/2019  . Tobacco abuse 08/26/2019  .  OA (osteoarthritis) of knee 08/26/2019  . OAB (overactive bladder) 07/19/2019  . Exertional chest pain 07/22/2018  . Abnormal myocardial perfusion study 07/22/2018  . RBBB 07/22/2018  . Left thyroid nodule 10/28/2017  . Xerostomia 10/28/2017  . Acute pharyngitis 09/26/2017  . 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 Nonintractable episodic headache 07/27/2017  . AKI (acute kidney injury) (HCC) 01/24/2017  . Syncope 01/24/2017  . Transaminitis 01/24/2017  . Painful total knee replacement, right (HCC) 08/26/2016  . Heart murmur  02/27/2016  . 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 Facet arthropathy, lumbosacral 06/15/2015  . Nuclear sclerotic cataract of left eye 07/13/2014  . Status post total right knee replacement 12/15/2013  . Arthrofibrosis of knee joint, right 10/11/2013  . Edema, unspecified 09/26/2013  . Gastro-esophageal reflux disease without esophagitis 09/26/2013  . Hypokalemia 09/26/2013  . Adiposity 09/26/2013  . Other intervertebral disc degeneration, 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  . Unspecified osteoarthritis, unspecified site 09/26/2013  . Gonalgia 09/26/2013  . Primary osteoarthritis of right knee 07/19/2013  . Pure hypercholesterolemia, unspecified 04/06/2007  . Essential hypertension 04/06/2007  . Left knee pain 04/06/2007    Past Surgical History:  Procedure Laterality Date  . CATARACT EXTRACTION, BILATERAL  2018  . KNEE SURGERY    . TUBAL LIGATION       OB History    Gravida  2   Para  2   Term  2   Preterm      AB      Living  2     SAB      IAB      Ectopic      Multiple      Live Births  2           Family History  Problem Relation Age of Onset  . Arthritis Mother   . Hypertension Mother   . Cancer Father   . Diabetes Sister   . Hypertension Sister   . Arthritis Daughter   . Asthma Daughter   . Kidney disease Daughter     Social History   Tobacco Use  . Smoking status: Former Smoker    Types: Cigarettes  . Smokeless tobacco: Never Used  . Tobacco comment: quit 30 years ago  Vaping Use  . Vaping Use: Never used  Substance Use Topics  . Alcohol use: No  . Drug use: No    Home Medications Prior to Admission medications   Medication Sig Start Date End Date Taking? Authorizing Provider  albuterol (VENTOLIN HFA) 108 (90 Base) MCG/ACT inhaler Inhale 2 puffs into the lungs every 6 (six) hours as needed for wheezing or shortness of breath. 02/27/20   Sharlene Dory, DO  amLODipine (NORVASC) 5 MG tablet Take 1 tablet  (5 mg total) by mouth daily. 04/16/20   Sharlene Dory, DO  aspirin EC 81 MG tablet Take 81 mg by mouth daily.    [provider]  celecoxib (CELEBREX) 100 MG capsule Take 1 capsule (100 mg total) by mouth 2 (two) times daily. 04/23/20   Sharlene Dory, DO  cholecalciferol (VITAMIN D) 400 units TABS tablet Take 400 Units by mouth daily.    [provider]  fluticasone (FLONASE) 50 MCG/ACT nasal spray Place 2 sprays into both nostrils daily. 04/23/20   Sharlene Dory, DO  losartan (COZAAR) 100 MG tablet Take 1 tablet (100 mg total) by mouth daily. 01/23/20   Sharlene Dory, DO  metoprolol succinate (TOPROL-XL) 50 MG 24 hr tablet Take  1 tablet (50 mg total) by mouth daily. Take immediately with or following a meal 11/10/19   Wendling, Jilda Roche, DO  Multiple Vitamin (MULTIVITAMIN) tablet Take 1 tablet by mouth daily.    [provider]  nitroGLYCERIN (NITROSTAT) 0.4 MG SL tablet Place 1 tablet (0.4 mg total) under the tongue every 5 (five) minutes as needed for chest pain. 04/23/20 07/22/20  Sharlene Dory, DO  oxybutynin (DITROPAN-XL) 10 MG 24 hr tablet Take 1 tablet (10 mg total) by mouth at bedtime. 04/23/20   Sharlene Dory, DO  pantoprazole (PROTONIX) 40 MG tablet Take 1 tablet by mouth once daily 04/02/20   Sharlene Dory, DO  rosuvastatin (CRESTOR) 10 MG tablet Take 1 tablet by mouth once daily 10/06/19   Baldo Daub, MD    Allergies    Patient has no known allergies.  Review of Systems   Review of Systems All other systems reviewed and are negative except that which was mentioned in HPI  Physical Exam Updated Vital Signs BP 136/88   Pulse 65   Temp 98.1 F (36.7 C) (Oral)   Resp 16   Ht 5\' 2"  (1.575 m)   Wt 88.5 kg   SpO2 100%   BMI 35.67 kg/m   Physical Exam Vitals and nursing note reviewed.  Constitutional:      General: She is not in acute distress.    Appearance: She is  well-developed and well-nourished.  HENT:     Head: Normocephalic and atraumatic.  Eyes:     Conjunctiva/sclera: Conjunctivae normal.  Musculoskeletal:        General: Normal range of motion.     Cervical back: Neck supple.     Comments: No midline spinal tenderness, mild tenderness L lateral lower lumbar paraspinal muscles into gluteal muscles  Skin:    General: Skin is warm and dry.  Neurological:     Mental Status: She is alert and oriented to person, place, and time.     Sensory: No sensory deficit.     Motor: No weakness.     Comments: Fluent speech; 5/5 strength and sensation BLE  Psychiatric:        Mood and Affect: Mood and affect normal.        Judgment: Judgment normal.     ED Results / Procedures / Treatments   Labs (all labs ordered are listed, but only abnormal results are displayed) Labs Reviewed - No data to display  EKG None  Radiology DG Lumbar Spine Complete  Result Date: 05/02/2020 CLINICAL DATA:  Left low back pain EXAM: LUMBAR SPINE - COMPLETE 4+ VIEW COMPARISON:  None. FINDINGS: Grade 1 anterolisthesis at L3-L4 and L4-L5. Lumbar vertebral body heights are maintained. Mild disc space narrowing, greatest at L3-L4 and L4-L5. Lower lumbar facet hypertrophy. IMPRESSION: Multilevel, particularly at lower lumbar degenerative changes. Electronically Signed   By: 06/30/2020 M.D.   On: 05/02/2020 10:00    Procedures Procedures   Medications Ordered in ED Medications  lidocaine (LIDODERM) 5 % 1 patch (1 patch Transdermal Patch Applied 05/02/20 1007)  acetaminophen (TYLENOL) tablet 1,000 mg (1,000 mg Oral Given 05/02/20 1008)    ED Course  I have reviewed the triage vital signs and the nursing notes.  Pertinent  imaging results that were available during my care of the patient were reviewed by me and considered in my medical decision making (see chart for details).    MDM Rules/Calculators/A&P  No red flag symptoms to suggest  infectious process or cauda equina. The location of sx suggests low back pain rather than actual hip pain and normal ROM of hip without problems. Lumbar XR shows degenerative changes. Recommended ortho f/u for consideration of further treatment such as steroid injection. Reviewed return precautions. Final Clinical Impression(s) / ED Diagnoses Final diagnoses:  Left-sided low back pain without sciatica, unspecified chronicity    Rx / DC Orders ED Discharge Orders    None       Maily Debarge, Ambrose Finland, MD 05/02/20 1527

## 2020-05-02 NOTE — ED Triage Notes (Signed)
Left hip pain for months and is getting worst.

## 2020-05-14 ENCOUNTER — Other Ambulatory Visit: Payer: Self-pay | Admitting: Family Medicine

## 2020-05-14 DIAGNOSIS — M5459 Other low back pain: Secondary | ICD-10-CM | POA: Diagnosis not present

## 2020-05-14 DIAGNOSIS — R413 Other amnesia: Secondary | ICD-10-CM

## 2020-05-15 NOTE — Telephone Encounter (Signed)
Error

## 2020-05-19 ENCOUNTER — Other Ambulatory Visit: Payer: Self-pay | Admitting: Family Medicine

## 2020-05-19 DIAGNOSIS — I10 Essential (primary) hypertension: Secondary | ICD-10-CM

## 2020-05-28 ENCOUNTER — Other Ambulatory Visit: Payer: Self-pay

## 2020-05-28 ENCOUNTER — Ambulatory Visit (AMBULATORY_SURGERY_CENTER): Payer: Medicare Other

## 2020-05-28 ENCOUNTER — Encounter: Payer: Self-pay | Admitting: Psychology

## 2020-05-28 VITALS — Ht 62.0 in | Wt 200.0 lb

## 2020-05-28 DIAGNOSIS — Z1211 Encounter for screening for malignant neoplasm of colon: Secondary | ICD-10-CM

## 2020-05-28 DIAGNOSIS — M545 Low back pain, unspecified: Secondary | ICD-10-CM | POA: Diagnosis not present

## 2020-05-28 MED ORDER — NA SULFATE-K SULFATE-MG SULF 17.5-3.13-1.6 GM/177ML PO SOLN: 1.0000 | Freq: Once | ORAL | 0 refills | Status: AC

## 2020-05-28 NOTE — Progress Notes (Signed)

## 2020-05-29 ENCOUNTER — Encounter: Payer: Self-pay | Admitting: Gastroenterology

## 2020-06-01 ENCOUNTER — Other Ambulatory Visit: Payer: Self-pay | Admitting: Family Medicine

## 2020-06-01 DIAGNOSIS — K219 Gastro-esophageal reflux disease without esophagitis: Secondary | ICD-10-CM

## 2020-06-11 ENCOUNTER — Encounter: Payer: Medicare Other | Admitting: Gastroenterology

## 2020-06-17 IMAGING — MG DIGITAL SCREENING BILAT W/ TOMO W/ CAD
6 of 12 series · 6 of 36 positions shown · non-contrast
Comparison: Previous exam(s).

CLINICAL DATA: Screening.

EXAM:
DIGITAL SCREENING BILATERAL MAMMOGRAM WITH TOMO AND CAD

[R XCCL synth-2D]
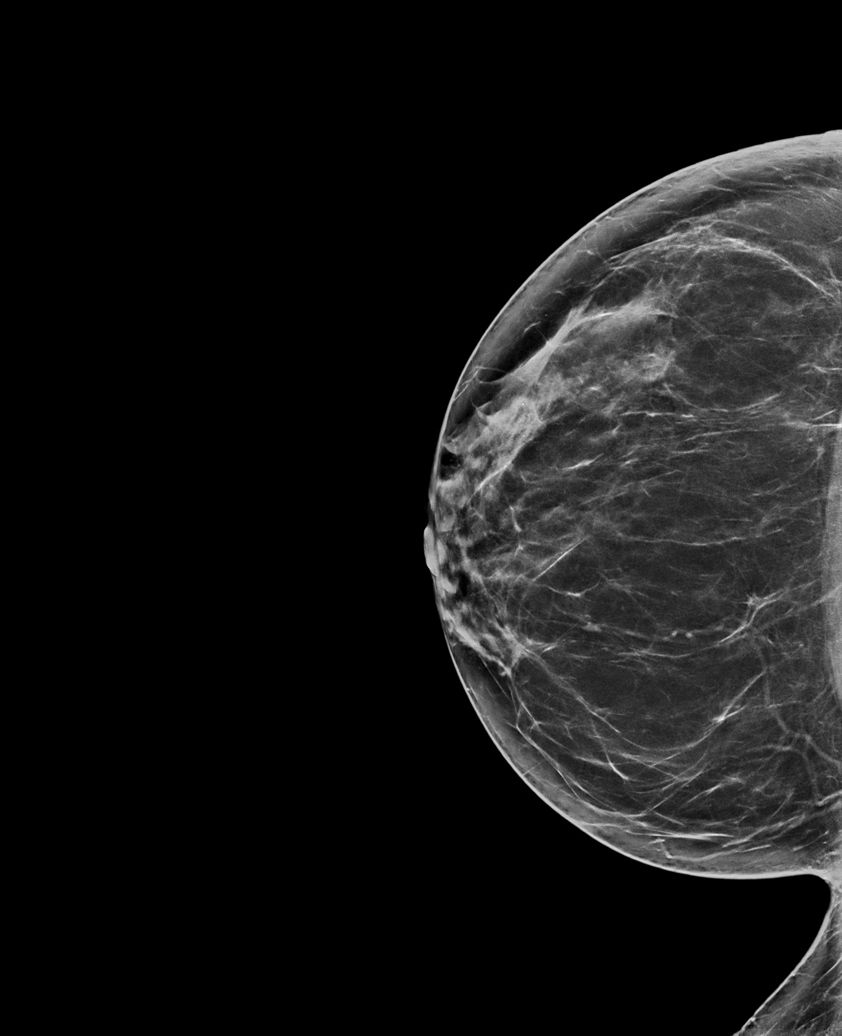

[R MLO synth-2D]
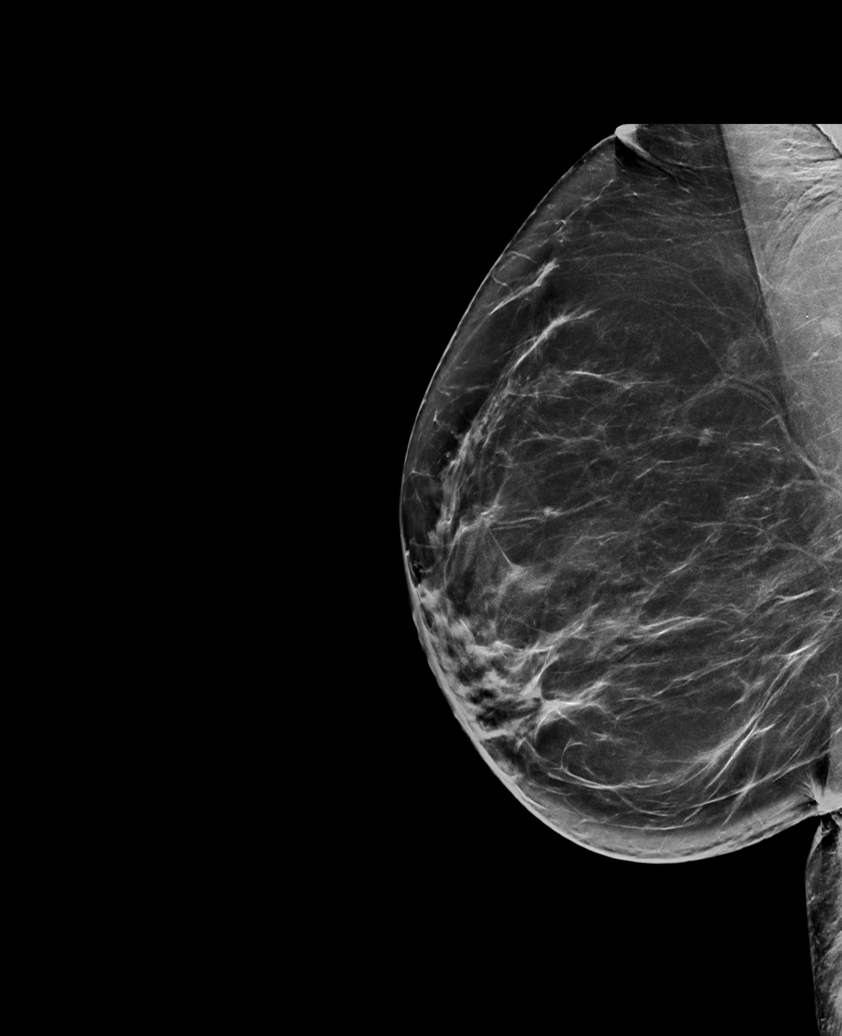

[L CC synth-2D]
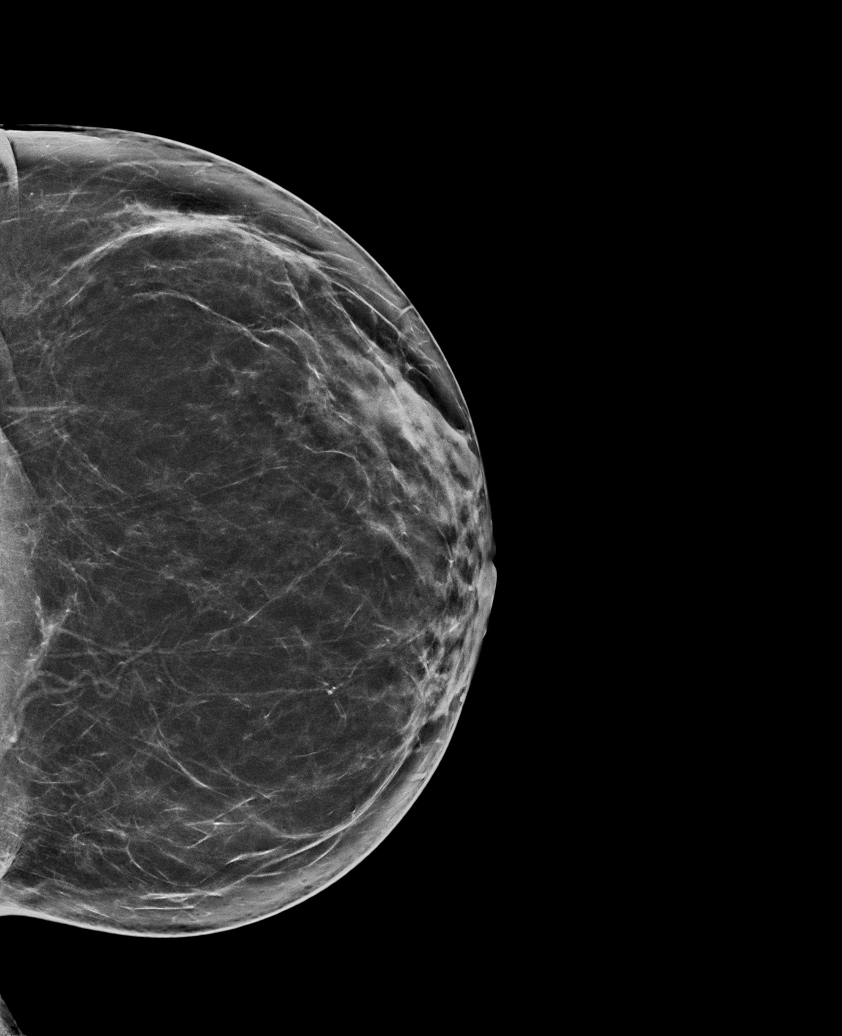

[R CC synth-2D]
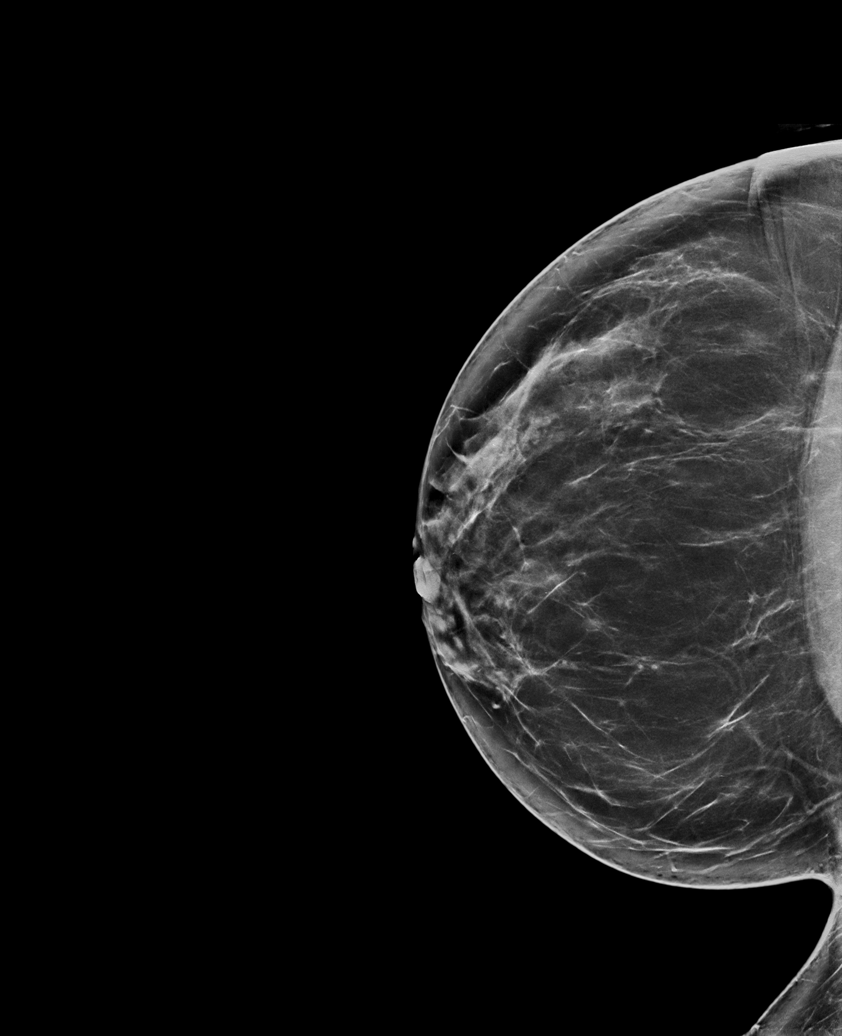

[L MLO synth-2D]
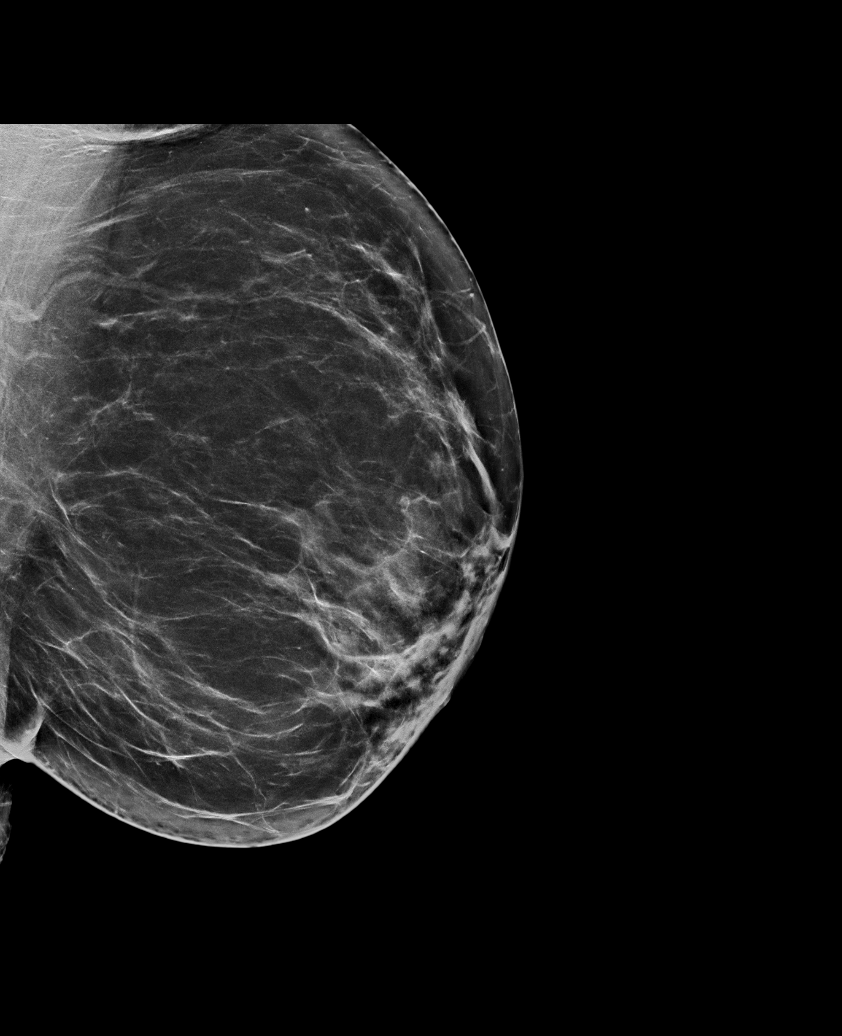

[L XCCL synth-2D]
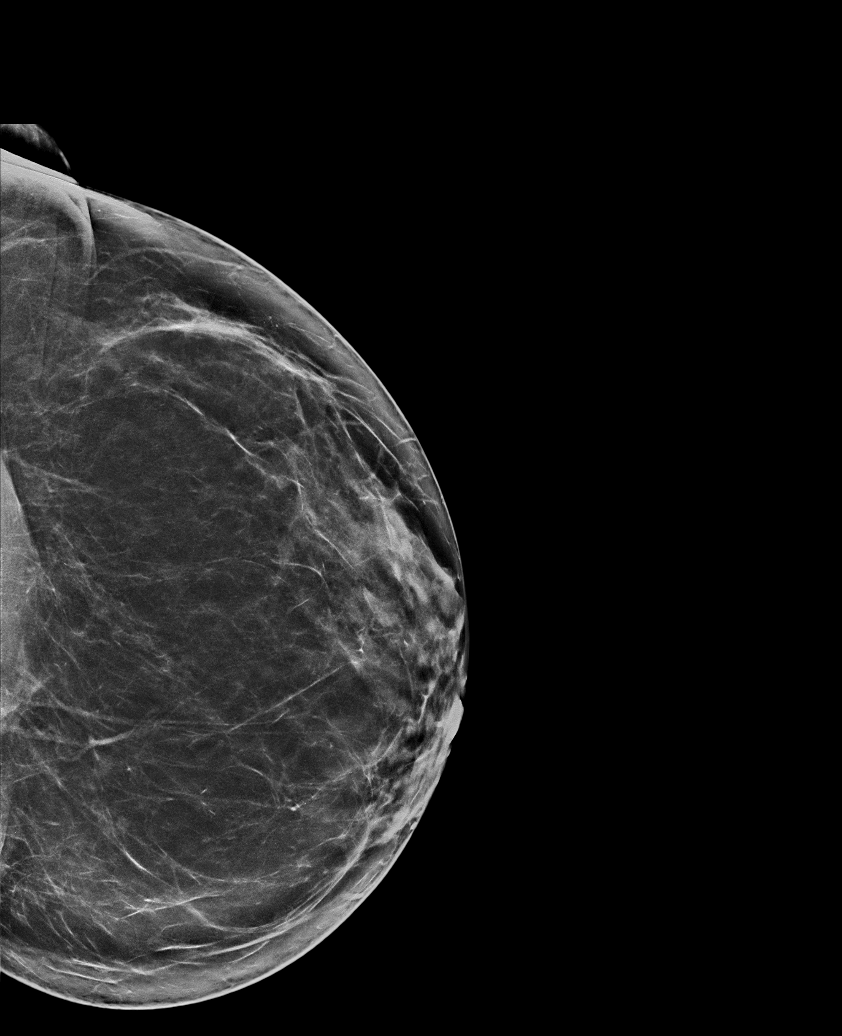

[6 of 36 positions shown; findings below may reference images not displayed]

ACR Breast Density Category b: There are scattered areas of
fibroglandular density.
FINDINGS: There are no findings suspicious for malignancy. Images were
processed with CAD.
IMPRESSION: No mammographic evidence of malignancy. A result letter of this
screening mammogram will be mailed directly to the patient.

RECOMMENDATION:
Screening mammogram in one year. (Code:[id number])

BI-RADS CATEGORY  1: Negative.

## 2020-07-11 ENCOUNTER — Other Ambulatory Visit: Payer: Self-pay

## 2020-07-11 ENCOUNTER — Ambulatory Visit (AMBULATORY_SURGERY_CENTER): Payer: Medicare Other | Admitting: Gastroenterology

## 2020-07-11 ENCOUNTER — Telehealth: Payer: Self-pay

## 2020-07-11 ENCOUNTER — Encounter: Payer: Self-pay | Admitting: Gastroenterology

## 2020-07-11 VITALS — BP 186/96 | HR 78 | Temp 98.9°F | Resp 33 | Ht 62.0 in | Wt 200.0 lb

## 2020-07-11 DIAGNOSIS — Z1211 Encounter for screening for malignant neoplasm of colon: Secondary | ICD-10-CM

## 2020-07-11 DIAGNOSIS — Z86010 Personal history of colonic polyps: Secondary | ICD-10-CM

## 2020-07-11 MED ORDER — SODIUM CHLORIDE 0.9 % IV SOLN: 500.0000 mL | Freq: Once | INTRAVENOUS | Status: DC

## 2020-07-11 NOTE — Patient Instructions (Signed)
You may resume your previous diet and medication schedule. ? ?Thank you for allowing us to care for you today!!! ? ? ?YOU HAD AN ENDOSCOPIC PROCEDURE TODAY AT THE [facility]:   Refer to the procedure report that was given to you for any specific questions about what was found during the examination.  If the procedure report does not answer your questions, please call your gastroenterologist to clarify.  If you requested that your care partner not be given the details of your procedure findings, then the procedure report has been included in a sealed envelope for you to review at your convenience later. ? ?YOU SHOULD EXPECT: Some feelings of bloating in the abdomen. Passage of more gas than usual.  Walking can help get rid of the air that was put into your GI tract during the procedure and reduce the bloating. If you had a lower endoscopy (such as a colonoscopy or flexible sigmoidoscopy) you may notice spotting of blood in your stool or on the toilet paper. If you underwent a bowel prep for your procedure, you may not have a normal bowel movement for a few days. ? ?Please Note:  You might notice some irritation and congestion in your nose or some drainage.  This is from the oxygen used during your procedure.  There is no need for concern and it should clear up in a day or so. ? ?SYMPTOMS TO REPORT IMMEDIATELY: ? ?Following lower endoscopy (colonoscopy or flexible sigmoidoscopy): ? Excessive amounts of blood in the stool ? Significant tenderness or worsening of abdominal pains ? Swelling of the abdomen that is new, acute ? Fever of 100?F or higher ? ?For urgent or emergent issues, a gastroenterologist can be reached at any hour by calling [phone]. ?Do not use MyChart messaging for urgent concerns.  ? ? ?DIET:  We do recommend a small meal at first, but then you may proceed to your regular diet.  Drink plenty of fluids but you should avoid alcoholic beverages for 24 hours. ? ?ACTIVITY:  You should  plan to take it easy for the rest of today and you should NOT DRIVE or use heavy machinery until tomorrow (because of the sedation medicines used during the test).   ? ?FOLLOW UP: ?Our staff will call the number listed on your records Friday morning between 7:15 am and 8:15 am following your procedure to check on you and address any questions or concerns that you may have regarding the information given to you following your procedure. If we do not reach you, we will leave a message.  We will attempt to reach you two times.  During this call, we will ask if you have developed any symptoms of COVID 19. If you develop any symptoms (ie: fever, flu-like symptoms, shortness of breath, cough etc.) before then, please call [phone].  If you test positive for Covid 19 in the 2 weeks post procedure, please call and report this information to us.   ? ?If any biopsies were taken you will be contacted by phone or by letter within the next 1-3 weeks.  Please call us at [phone] if you have not heard about the biopsies in 3 weeks.  ? ? ?SIGNATURES/CONFIDENTIALITY: ?You and/or your care partner have signed paperwork which will be entered into your electronic medical record.  These signatures attest to the fact that that the information above on your After Visit Summary has been reviewed and is understood.  Full responsibility of the confidentiality of this   discharge information lies with you and/or your care-partner. ? ?

## 2020-07-11 NOTE — Progress Notes (Signed)
A and O x3. Report to RN. Tolerated MAC anesthesia well.

## 2020-07-11 NOTE — Progress Notes (Signed)
VS by CW  I have reviewed the patient's medical history in detail and updated the computerized patient record.  

## 2020-07-11 NOTE — Op Note (Signed)
[facility] Patient Name: Cynthia Montgomery Procedure Date: 07/11/2020 2:41 PM MRN: [id number] Endoscopist: Tressia Danas MD, MD Age: 70 Referring MD:  Date of Birth: 21-Aug-1950 Gender: Female Account #: [id number] Procedure:                Colonoscopy Indications:              Screening for colorectal malignant neoplasm                           Patient reports prior colonoscopy in [facility] with                            the removal of polyps                           No known family history of colon cancer or polyps Medicines:                Monitored Anesthesia Care Procedure:                Pre-Anesthesia Assessment:                           - Prior to the procedure, a History and Physical                            was performed, and patient medications and                            allergies were reviewed. The patient's tolerance of                            previous anesthesia was also reviewed. The risks                            and benefits of the procedure and the sedation                            options and risks were discussed with the patient.                            All questions were answered, and informed consent                            was obtained. Prior Anticoagulants: The patient has                            taken no previous anticoagulant or antiplatelet                            agents. ASA Grade Assessment: II - A patient with                            mild systemic disease. After reviewing the risks  and benefits, the patient was deemed in                            satisfactory condition to undergo the procedure.                           After obtaining informed consent, the colonoscope                            was passed under direct vision. Throughout the                            procedure, the patient's blood pressure, pulse, and                            oxygen saturations were monitored  continuously. The                            Olympus CF-HQ190L (Serial# [id number]) Colonoscope was                            introduced through the anus and advanced to the 3                            cm into the ileum. A second forward view of the                            right colon was performed. The colonoscopy was                            performed without difficulty. The patient tolerated                            the procedure well. The quality of the bowel                            preparation was good. The terminal ileum, ileocecal                            valve, appendiceal orifice, and rectum were                            photographed. Scope In: 2:47:13 PM Scope Out: 3:01:08 PM Scope Withdrawal Time: 0 hours 10 minutes 33 seconds  Total Procedure Duration: 0 hours 13 minutes 55 seconds  Findings:                 The perianal and digital rectal examinations were                            normal.                           The entire examined colon appeared normal on direct  and retroflexion views. Complications:            No immediate complications. Estimated Blood Loss:     Estimated blood loss: none. Impression:               - The entire examined colon is normal on direct and                            retroflexion views.                           - No specimens collected. Recommendation:           - Patient has a contact number available for                            emergencies. The signs and symptoms of potential                            delayed complications were discussed with the                            patient. Return to normal activities tomorrow.                            Written discharge instructions were provided to the                            patient.                           - Resume previous diet.                           - Continue present medications.                           - Repeat colonoscopy in 10 years  for surveillance,                            consider surveillance in 5 years if history of                            removed polyps on prior colonoscopy were adenomas.                           - Will work to obtain prior colonoscopy and                            pathology results from [place], [place].                           - Emerging evidence supports eating a diet of                            fruits, vegetables, grains, calcium, and yogurt  while reducing red meat and alcohol may reduce the                            risk of colon cancer.                           - Thank you for allowing me to be involved in your                            colon cancer prevention. Tressia Danas MD, MD 07/11/2020 3:06:53 PM This report has been signed electronically.

## 2020-07-11 NOTE — Telephone Encounter (Signed)
[facility]  Location where fax was sent: Medical Records fax #[phone]  Documents Faxed: Signed [facility] Confirmation received: Yes Documents and confirmation held for future reference: Yes

## 2020-07-13 ENCOUNTER — Telehealth: Payer: Self-pay | Admitting: *Deleted

## 2020-07-13 ENCOUNTER — Telehealth: Payer: Self-pay

## 2020-07-13 NOTE — Telephone Encounter (Signed)
LVM

## 2020-07-13 NOTE — Telephone Encounter (Signed)
First follow up call attempt.  LVM. 

## 2020-07-17 ENCOUNTER — Ambulatory Visit (INDEPENDENT_AMBULATORY_CARE_PROVIDER_SITE_OTHER): Payer: Medicare Other | Admitting: Psychology

## 2020-07-17 ENCOUNTER — Encounter: Payer: Self-pay | Admitting: Psychology

## 2020-07-17 ENCOUNTER — Other Ambulatory Visit: Payer: Self-pay

## 2020-07-17 ENCOUNTER — Ambulatory Visit: Payer: Medicare Other | Admitting: Psychology

## 2020-07-17 DIAGNOSIS — I10 Essential (primary) hypertension: Secondary | ICD-10-CM | POA: Diagnosis not present

## 2020-07-17 DIAGNOSIS — R4189 Other symptoms and signs involving cognitive functions and awareness: Secondary | ICD-10-CM

## 2020-07-17 DIAGNOSIS — G8929 Other chronic pain: Secondary | ICD-10-CM

## 2020-07-17 DIAGNOSIS — M545 Low back pain, unspecified: Secondary | ICD-10-CM | POA: Diagnosis not present

## 2020-07-17 NOTE — Progress Notes (Signed)
NEUROPSYCHOLOGICAL EVALUATION [place] Department of Neurology  Date of Evaluation: July 17, 2020  Reason for Referral:   Cynthia Montgomery is a 70 y.o. left-handed African-American female referred by Cynthia Montgomery, D.O., to characterize her current cognitive functioning and assist with diagnostic clarity and treatment planning in the context of subjective memory dysfunction and a family history of memory loss and concerns for dementia.   Assessment and Plan:   Clinical Impression(s): Cynthia Montgomery pattern of performance is suggestive of neuropsychological functioning within normal limits relative to age-matched peers and premorbid intellectual estimations. A relative weakness was exhibited recalling previously learned shapes. However, performance remained in the below average range and is not cause for concern, especially as verbal recall was average to well above average across all three assessments. Overall, performance was appropriate across all assessed cognitive domains. This included processing speed, attention/concentration, executive functioning, receptive and expressive language, visuospatial abilities, and learning and memory. Cynthia Montgomery denied difficulties completing instrumental activities of daily living (ADLs) independently.  Factors which can create and maintain cognitive inefficiencies include various psychosocial stressors, chronic pain, and ongoing sleep disturbances. It is possible that a combination of these factors are responsible for day-to-day cognitive difficulties which Cynthia Montgomery has been experiencing presently. It is also worth highlighting that just because there is a family history of dementia, this in and of itself does not destine Cynthia Montgomery to develop this condition. Specific to memory, Cynthia Montgomery was able to learn novel verbal and visual information efficiently and retain this knowledge after lengthy delays. Overall,  memory performance combined with intact performances across other areas of cognitive functioning is not suggestive of Alzheimer's disease. Likewise, her cognitive and behavioral profile is not suggestive of any other form of neurodegenerative illness presently.  Recommendations: Should she report worsening cognitive and/or functional decline in the future, a repeat evaluation would be warranted at that time. The current evaluation will serve as an excellent baseline to compare any future evaluations against.    She could discuss ways to decrease difficulties staying asleep throughout the night with her PCP. Various sleep-aid medications could be considered. She could also discuss sleep hygiene practices with a sleep specialist.   Cynthia Montgomery is encouraged to attend to lifestyle factors for brain health (e.g., regular physical exercise, good nutrition habits, regular participation in cognitively-stimulating activities, and general stress management techniques), which are likely to have benefits for both emotional adjustment and cognition. Optimal control of vascular risk factors (including safe cardiovascular exercise and adherence to dietary recommendations) is encouraged. Continued participation in activities which provide mental stimulation and social interaction is also recommended.   If interested, there are some activities which have therapeutic value and can be useful in keeping her cognitively stimulated. For suggestions, Cynthia Montgomery is encouraged to go to the following website: https://www.[facility].org/get-to-know-barrow/centers-programs/neurorehabilitation-center/neuro-rehab-apps-and-games/ which has options, categorized by level of difficulty. It should be noted that these activities should not be viewed as a substitute for therapy.  Memory can be improved using internal strategies such as rehearsal, repetition, chunking, mnemonics, association, and imagery. External strategies such as  written notes in a consistently used memory journal, visual and nonverbal auditory cues such as a calendar on the refrigerator or appointments with alarm, such as on a cell phone, can also help maximize recall.    Review of Records:   Cynthia Montgomery was seen by her PCP Cynthia Montgomery, D.O.) on 02/27/2020 for follow-up. At that time, Cynthia Montgomery reported worsening short-term memory, ongoing for  the past 1-2 months. She noted occasionally forgetting names, while long-term memory was said to be intact. No behavioral changes were noted, nor concerns surrounding psychiatric distress or sleep disturbances. She did report a family history of memory loss in her mother, maternal aunt, and sister. Ultimately, [name] was referred for a comprehensive neuropsychological evaluation to characterize her cognitive abilities and to assist with diagnostic clarity and treatment planning.   No neuroimaging was available for review.   Past Medical History:  Diagnosis Date  . Abnormal myocardial perfusion study 07/22/2018  . Acute pharyngitis 09/26/2017  . Adiposity 09/26/2013  . AKI (acute kidney injury) 01/24/2017  . Arthrofibrosis of knee joint, right 10/11/2013  . Asthma   . Chronic back pain 08/26/2019  . Edema, unspecified 09/26/2013  . Essential hypertension    Dx 30 ys ago; normal BP 130/60 (with med compliance)  . Exertional chest pain 07/22/2018  . Facet arthropathy, lumbosacral 06/15/2015  . Gastro-esophageal reflux disease without esophagitis 09/26/2013  . Gonalgia 09/26/2013  . Heart murmur 02/27/2016  . History of cataracts    bilateral; s/p surgery  . History of colon polyps   . Hyperlipidemia 04/23/2020  . Hypokalemia 09/26/2013  . Intervertebral disc degeneration, 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  . Left knee pain 04/06/2007  . Left thyroid nodule 10/28/2017  . 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 Nonintractable episodic headache 07/27/2017  . Nuclear sclerotic cataract of left eye 07/13/2014   . OA (osteoarthritis) of knee    s/p left TKR 09/2010  . OAB (overactive bladder) 07/19/2019  . Obesity, Class II, BMI 35-39.9 08/26/2019  . Positive TB test   . Primary osteoarthritis of right knee 07/19/2013  . Pure hypercholesterolemia 04/06/2007   Annotation: reports a history, and that she was on a medication for it  . RBBB (right bundle branch block) 07/22/2018  . S/P total knee replacement, right 08/26/2016  . Snoring 08/26/2019   STOP BANG 4  . Syncope 01/24/2017  . Transaminitis 01/24/2017  . Xerostomia 10/28/2017    Past Surgical History:  Procedure Laterality Date  . CATARACT EXTRACTION, BILATERAL  2018  . KNEE SURGERY     states 2 surgeries on each knee around 2016 and 2017  . TUBAL LIGATION      Current Outpatient Medications:  .  albuterol (VENTOLIN HFA) 108 (90 Base) MCG/ACT inhaler, Inhale 2 puffs into the lungs every 6 (six) hours as needed for wheezing or shortness of breath. (Patient not taking: Reported on 07/11/2020), Disp: 18 g, Rfl: 4 .  amLODipine (NORVASC) 5 MG tablet, Take 1 tablet (5 mg total) by mouth daily., Disp: 90 tablet, Rfl: 3 .  aspirin EC 81 MG tablet, Take 81 mg by mouth daily., Disp: , Rfl:  .  celecoxib (CELEBREX) 100 MG capsule, Take 1 capsule (100 mg total) by mouth 2 (two) times daily., Disp: 180 capsule, Rfl: 2 .  cholecalciferol (VITAMIN D) 400 units TABS tablet, Take 400 Units by mouth daily., Disp: , Rfl:  .  COVID-19 mRNA vaccine, Pfizer, 30 MCG/0.3ML injection, INJECT AS DIRECTED, Disp: .3 mL, Rfl: 0 .  fluticasone (FLONASE) 50 MCG/ACT nasal spray, Place 2 sprays into both nostrils daily. (Patient taking differently: Place 2 sprays into both nostrils daily as needed.), Disp: 16 g, Rfl: 6 .  influenza vaccine adjuvanted (FLUAD) 0.5 ML injection, TAKE AS DIRECTED, Disp: .5 mL, Rfl: 0 .  losartan (COZAAR) 100 MG tablet, Take 1 tablet (100 mg total) by mouth daily., Disp: 90 tablet, Rfl: 2 .  metoprolol succinate (TOPROL-XL) 50 MG 24 hr  tablet, TAKE 1 TABLET BY MOUTH ONCE DAILY. TAKE WITH OR IMMEDIATELY FOLLOWING A MEAL, Disp: 90 tablet, Rfl: 0 .  Multiple Vitamin (MULTIVITAMIN) tablet, Take 1 tablet by mouth daily., Disp: , Rfl:  .  nitroGLYCERIN (NITROSTAT) 0.4 MG SL tablet, Place 1 tablet (0.4 mg total) under the tongue every 5 (five) minutes as needed for chest pain., Disp: 25 tablet, Rfl: 3 .  oxybutynin (DITROPAN-XL) 10 MG 24 hr tablet, Take 1 tablet (10 mg total) by mouth at bedtime., Disp: 90 tablet, Rfl: 2 .  pantoprazole (PROTONIX) 40 MG tablet, Take 1 tablet by mouth once daily (Patient not taking: Reported on 07/11/2020), Disp: 60 tablet, Rfl: 0 .  rosuvastatin (CRESTOR) 10 MG tablet, Take 1 tablet by mouth once daily, Disp: 90 tablet, Rfl: 2  Clinical Interview:   The following information was obtained during a clinical interview with Cynthia Montgomery prior to cognitive testing.  Cognitive Symptoms: Decreased short-term memory: Endorsed. She described instances where she will enter a room and forget her original intention (e.g., entering the kitchen and forget what she was searching for). Occasional navigational difficulties were further described while driving. She also alluded to her husband expressing concerns that she may not remember details of previous conversations as well as she used to. Difficulties were not said to be consistently observed. Rather, symptoms were said to be present "here and there" for the past 4-6 months.  Decreased long-term memory: Denied. Decreased attention/concentration: Denied. Reduced processing speed: Denied. Difficulties with executive functions: Denied. Difficulties with emotion regulation: Denied. Difficulties with receptive language: Denied. Difficulties with word finding: Endorsed "occasionally."  Decreased visuoperceptual ability: Denied.  Difficulties completing ADLs: Denied.  Additional Medical History: History of traumatic brain injury/concussion: Denied. History of  stroke: Denied. History of seizure activity: Denied. History of known exposure to toxins: Denied. Symptoms of chronic pain: Endorsed. She reported a history of degenerative disc disease creating chronic back pain. Despite this, pain levels were said to be manageable and she noted that she is still active in her day-to-day life. Medical records also suggest prior and ongoing knee pain, as well as a history of right knee replacement.  Experience of frequent headaches/migraines: Denied. Medical records suggest a prior history of headaches.  Frequent instances of dizziness/vertigo: Denied.  Sensory changes: She has had bilateral cataract surgery and currently utilizes glasses with positive effect. She stated that her husband has expressed concerns surrounding her hearing; however, she stated that she completed an audiologic test which was unremarkable. Other sensory changes/difficulties (e.g., taste or smell) were denied.  Balance/coordination difficulties: Denied. She also denied any recent falls.  Other motor difficulties: Denied.  Sleep History: Estimated hours obtained each night: 5-6 hours, sometimes more. She reported commonly going to sleep around 10:00 but will awake between 3:30 and 4:00am. There will then be times where she is unable to fall back asleep and she simply gets up to start her day.  Difficulties falling asleep: Denied. Difficulties staying asleep: Endorsed (see above). Feels rested and refreshed upon awakening: Endorsed "for the most part."  History of snoring: Endorsed. History of waking up gasping for air: Denied. Witnessed breath cessation while asleep: Denied.  History of vivid dreaming: Endorsed. She described a few instances where dreaming will be so realistic that she will wake up and have to ponder if what she dreamt actually happened. On some occasions, she will tell stories about her dreams to others which were said to be somewhat unnerving  or confusing.  Excessive  movement while asleep: Denied. Instances of acting out her dreams: Denied.  Psychiatric/Behavioral Health History: Depression: She described her current mood as "good" and denied to her knowledge any prior mental health concerns or diagnoses. Current or remote suicidal ideation, intent, or plan was denied.  Anxiety: Denied. Mania: Denied. Trauma History: Denied. Visual/auditory hallucinations: Denied. Delusional thoughts: Denied.  Tobacco: Denied. She reported quitting in the distant past.  Alcohol: She denied current alcohol consumption as well as a history of problematic alcohol abuse or dependence.  Recreational drugs: Denied. Caffeine: She reported consuming an occasional cup of coffee a few times per week.   Family History: Problem Relation Age of Onset  . Arthritis Mother   . Hypertension Mother   . Memory loss Mother   . Cancer Father   . Pancreatic cancer Father   . Diabetes Sister   . Hypertension Sister   . Memory loss Sister   . Arthritis Daughter   . Asthma Daughter   . Kidney disease Daughter   . Memory loss Maternal Aunt   . Colon cancer Neg Hx   . Colon polyps Neg Hx   . Esophageal cancer Neg Hx   . Stomach cancer Neg Hx   . Rectal cancer Neg Hx    This information was confirmed by Cynthia Montgomery.  Academic/Vocational History: Highest level of educational attainment: 12 years. She graduated from high school and described herself as an average (B) student in academic settings. English was noted as a likely relative weakness.  History of developmental delay: Denied. History of grade repetition: Denied. Enrollment in special education courses: Denied. History of LD/ADHD: Denied.  Employment: Retired. 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 Previously, she noted working in numerous capacities, including as an Advertising account planner, driving a truck, and for a Chiropractor.   Evaluation Results:   Behavioral Observations: Ms.  Montgomery was unaccompanied, arrived to her appointment on time, and was appropriately dressed and groomed. She appeared alert and oriented. Observed gait and station were within normal limits. Gross motor functioning appeared intact upon informal observation and no abnormal movements (e.g., tremors) were noted. Her affect was relaxed and positive during the interview. Spontaneous speech was fluent and word finding difficulties were not observed during the clinical interview. Thought processes were coherent, organized, and normal in content. Insight into her cognitive difficulties appeared adequate. During testing, sustained attention was appropriate. Task engagement was adequate and she persisted when challenged. Overall, Cynthia Montgomery was cooperative with the clinical interview and subsequent testing procedures.   Adequacy of Effort: The validity of neuropsychological testing is limited by the extent to which the individual being tested may be assumed to have exerted adequate effort during testing. Cynthia Montgomery expressed her intention to perform to the best of her abilities and exhibited adequate task engagement and persistence. Scores across stand-alone and embedded performance validity measures were within expectation. As such, the results of the current evaluation are believed to be a valid representation of Cynthia Montgomery current cognitive functioning.  Test Results: Cynthia Montgomery was fully oriented at the time of the current evaluation.  Intellectual abilities based upon educational and vocational attainment were estimated to be in the average range. Premorbid abilities were estimated to be within the below average range based upon a single-word reading test.   Processing speed was average to above average. Basic attention was above average. More complex attention (e.g., working memory) was average. Executive functioning was average  to above average. Performance was also average across a task assessing  safety and judgment.  Assessed receptive language abilities were above average. Likewise, Cynthia Montgomery did not exhibit any difficulties comprehending task instructions and answered all questions asked of her appropriately. Assessed expressive language (e.g., verbal fluency and confrontation naming) was average to above average.     Assessed visuospatial/visuoconstructional abilities were average to well above average.    Learning (i.e., encoding) of novel verbal and visual information was average to well above average. Spontaneous delayed recall (i.e., retrieval) of previously learned information was below average across a shape learning task but average to well above average across all verbal measures. Retention rates were 88% across a story learning task, 100% across a list learning task, and 60% across a shape learning task. Performance across recognition tasks was average to above average, suggesting evidence for information consolidation.   Results of emotional screening instruments suggested that recent symptoms of generalized anxiety were in the minimal range, while symptoms of depression were within normal limits. A screening instrument assessing recent sleep quality suggested the presence of mild sleep dysfunction.  Tables of Scores:   Note: This summary of test scores accompanies the interpretive report and should not be considered in isolation without reference to the appropriate sections in the text. Descriptors are based on appropriate normative data and may be adjusted based on clinical judgment. The terms "impaired" and "within normal limits (WNL)" are used when a more specific level of functioning cannot be determined.       Validity Testing:   DESCRIPTOR       ACS Word Choice: --- --- Within Expectation  Dot Counting Test: --- --- Within Expectation  NAB EVI: --- --- Within Expectation  D-KEFS Color Word Effort Index: --- --- Within Expectation       Orientation:      Raw Score  Percentile   NAB Orientation, Form 1 29/29 --- ---       Cognitive Screening:           Raw Score Percentile   SLUMS: 22/30 --- ---       Intellectual Functioning:           Standard Score Percentile   Test of Premorbid Functioning: 88 21 Below Average       Memory:          NAB Memory Module, Form 1: Standard Score/ T Score Percentile   Total Memory Index 99 47 Average  List Learning       Total Trials 1-3 20/36 (47) 38 Average    List B 4/12 (50) 50 Average    Short Delay Free Recall 7/12 (51) 54 Average    Long Delay Free Recall 7/12 (51) 54 Average    Retention Percentage 100 (51) 54 Average    Recognition Discriminability 6 (46) 34 Average  Shape Learning       Total Trials 1-3 13/27 (46) 34 Average    Delayed Recall 3/9 (37) 9 Below Average    Retention Percentage 60 (38) 12 Below Average    Recognition Discriminability 6 (47) 38 Average  Story Learning       Immediate Recall 60/80 (50) 50 Average    Delayed Recall 29/40 (45) 31 Average    Retention Percentage 88 (49) 46 Average  Daily Living Memory       Immediate Recall 47/51 (63) 91 Well Above Average    Delayed Recall 17/17 (63) 91 Well Above Average    Retention  Percentage 100 (59) 82 Above Average    Recognition Hits 10/10 (60) 84 Above Average       Attention/Executive Function:          Trail Making Test (TMT): Raw Score (T Score) Percentile     Part A 25 secs.,  0 errors (62) 88 Above Average    Part B 77 secs.,  2 errors (60) 84 Above Average         Scaled Score Percentile   WAIS-IV Coding: 9 37 Average       NAB Attention Module, Form 1: T Score Percentile     Digits Forward 59 82 Above Average    Digits Backwards 51 54 Average        Scaled Score Percentile   WAIS-IV Similarities: 8 25 Average       D-KEFS Color-Word Interference Test: Raw Score (Scaled Score) Percentile     Color Naming 37 secs. (8) 25 Average    Word Reading 20 secs. (12) 75 Above Average    Inhibition 70 secs. (10)  50 Average      Total Errors 4 errors (8) 25 Average    Inhibition/Switching 59 secs. (13) 84 Above Average      Total Errors 1 error (12) 75 Above Average       NAB Executive Functions Module, Form 1: T Score Percentile     Judgment 54 66 Average       Language:          Verbal Fluency Test: Raw Score (T Score) Percentile     Phonemic Fluency (FAS) 26 (47) 38 Average    Animal Fluency 16 (53) 62 Average        NAB Language Module, Form 1: T Score Percentile     Auditory Comprehension 58 79 Above Average    Naming 30/31 (57) 75 Above Average       Visuospatial/Visuoconstruction:      Raw Score Percentile   Clock Drawing: 8/10 --- Within Normal Limits       NAB Spatial Module, Form 1: T Score Percentile     Figure Drawing Copy 63 91 Well Above Average        Scaled Score Percentile   WAIS-IV Block Design: 9 37 Average       Mood and Personality:      Raw Score Percentile   Beck Depression Inventory - II: 9 --- Within Normal Limits  PROMIS Anxiety Questionnaire: 12 --- None to Slight       Additional Questionnaires:      Raw Score Percentile   PROMIS Sleep Disturbance Questionnaire: 25 --- Mild   Informed Consent and Coding/Compliance:   The current evaluation represents a clinical evaluation for the purposes previously outlined by the referral source and is in no way reflective of a forensic evaluation.   Ms. Heuberger was provided with a verbal description of the nature and purpose of the present neuropsychological evaluation. Also reviewed were the foreseeable risks and/or discomforts and benefits of the procedure, limits of confidentiality, and mandatory reporting requirements of this provider. The patient was given the opportunity to ask questions and receive answers about the evaluation. Oral consent to participate was provided by the patient.   This evaluation was conducted by Newman Nickels, Ph.D., licensed clinical neuropsychologist. Ms. Hur completed a  clinical interview with Dr. Milbert Coulter, billed as one unit [phone], and 130 minutes of cognitive testing and scoring, billed as one unit [phone] and three additional units [id number].  Psychometrist Wallace Kellerana Chamberlain, B.S., assisted Dr. Milbert CoulterMerz with test administration and scoring procedures. As a separate and discrete service, Dr. Milbert CoulterMerz spent a total of 130 minutes in interpretation and report writing billed as one unit [phone] and one unit [phone].

## 2020-07-17 NOTE — Progress Notes (Signed)
   Psychometrician Note   Cognitive testing was administered to Cynthia Montgomery by Wallace Keller, B.S. (psychometrist) under the supervision of Dr. Newman Nickels, Ph.D., licensed psychologist on 07/17/20. [name] did not appear overtly distressed by the testing session per behavioral observation or responses across self-report questionnaires. Rest breaks were offered.    The battery of tests administered was selected by Dr. Newman Nickels, Ph.D. with consideration to [name]'s current level of functioning, the nature of her symptoms, emotional and behavioral responses during interview, level of literacy, observed level of motivation/effort, and the nature of the referral question. This battery was communicated to the psychometrist. Communication between Dr. Newman Nickels, Ph.D. and the psychometrist was ongoing throughout the evaluation and Dr. Newman Nickels, Ph.D. was immediately accessible at all times. Dr. Newman Nickels, Ph.D. provided supervision to the psychometrist on the date of this service to the extent necessary to assure the quality of all services provided.    Cynthia Montgomery will return within approximately 1-2 weeks for an interactive feedback session with Dr. Milbert Coulter at which time her test performances, clinical impressions, and treatment recommendations will be reviewed in detail. [name] understands she can contact our office should she require our assistance before this time.  A total of 130 minutes of billable time were spent face-to-face with [name] by the psychometrist. This includes both test administration and scoring time. Billing for these services is reflected in the clinical report generated by Dr. Newman Nickels, Ph.D.  This note reflects time spent with the psychometrician and does not include test scores or any clinical interpretations made by Dr. Milbert Coulter. The full report will follow in a separate note.

## 2020-07-24 ENCOUNTER — Encounter: Payer: Medicare Other | Admitting: Psychology

## 2020-07-25 ENCOUNTER — Ambulatory Visit: Payer: Medicare Other | Admitting: Psychology

## 2020-07-25 ENCOUNTER — Other Ambulatory Visit: Payer: Self-pay

## 2020-07-25 DIAGNOSIS — R4189 Other symptoms and signs involving cognitive functions and awareness: Secondary | ICD-10-CM

## 2020-07-25 NOTE — Progress Notes (Signed)
   Neuropsychology Feedback Session Cynthia Montgomery. [facility] Department of Neurology  Reason for Referral:   Cynthia Montgomery a 70 y.o. left-handed African-American female referred by Arva Chafe, D.O.,to characterize hercurrent cognitive functioning and assist with diagnostic clarity and treatment planning in the context of subjective memory dysfunction and a family history of memory loss and concerns for dementia.   Feedback:   Cynthia Montgomery completed a comprehensive neuropsychological evaluation on 07/17/2020. Please refer to that encounter for the full report and recommendations. Briefly, results suggested neuropsychological functioning within normal limits relative to age-matched peers and premorbid intellectual estimations. A relative weakness was exhibited recalling previously learned shapes. However, performance remained in the below average range and is not cause for concern, especially as verbal recall was average to well above average across all three assessments. Overall, performance was appropriate across all assessed cognitive domains. Factors which can create and maintain cognitive inefficiencies include various psychosocial stressors, chronic pain, and ongoing sleep disturbances. It is possible that a combination of these factors are responsible for day-to-day cognitive difficulties which Cynthia Montgomery has been experiencing presently.  Cynthia Montgomery was unaccompanied during the current feedback session. Content of the current session focused on the results of her neuropsychological evaluation. Cynthia Montgomery was given the opportunity to ask questions and her questions were answered. She was encouraged to reach out should additional questions arise. A copy of her report was provided at the conclusion of the visit.      Less than 16 minutes were spent conducting the current feedback session with Cynthia Montgomery.

## 2020-08-22 ENCOUNTER — Other Ambulatory Visit: Payer: Self-pay | Admitting: Cardiology

## 2020-08-22 ENCOUNTER — Other Ambulatory Visit: Payer: Self-pay | Admitting: Family Medicine

## 2020-08-22 DIAGNOSIS — I10 Essential (primary) hypertension: Secondary | ICD-10-CM

## 2020-08-22 NOTE — Telephone Encounter (Signed)
Refill sent to pharmacy.   

## 2020-09-06 ENCOUNTER — Telehealth: Payer: Self-pay | Admitting: Gastroenterology

## 2020-09-06 NOTE — Telephone Encounter (Signed)
Reviewed 106 pages of records regarding prior care in an effort to understand her history of colon polyps.  Colonoscopy in 2013 with removal of 2 polyps.  Pathology results not included.

## 2020-09-07 NOTE — Telephone Encounter (Signed)
Called pt and informed about Dr. Orvan Falconer response below. Verbalized acceptance and understanding.

## 2020-10-21 ENCOUNTER — Other Ambulatory Visit: Payer: Self-pay | Admitting: Family Medicine

## 2020-10-21 DIAGNOSIS — I10 Essential (primary) hypertension: Secondary | ICD-10-CM

## 2020-10-22 ENCOUNTER — Other Ambulatory Visit: Payer: Self-pay

## 2020-10-22 ENCOUNTER — Ambulatory Visit (INDEPENDENT_AMBULATORY_CARE_PROVIDER_SITE_OTHER): Payer: Medicare Other | Admitting: Family Medicine

## 2020-10-22 ENCOUNTER — Other Ambulatory Visit (HOSPITAL_BASED_OUTPATIENT_CLINIC_OR_DEPARTMENT_OTHER): Payer: Self-pay

## 2020-10-22 ENCOUNTER — Encounter: Payer: Self-pay | Admitting: Family Medicine

## 2020-10-22 VITALS — BP 138/88 | HR 60 | Temp 97.6°F | Ht 62.0 in | Wt 202.0 lb

## 2020-10-22 DIAGNOSIS — R635 Abnormal weight gain: Secondary | ICD-10-CM | POA: Diagnosis not present

## 2020-10-22 DIAGNOSIS — Z6835 Body mass index (BMI) 35.0-35.9, adult: Secondary | ICD-10-CM | POA: Diagnosis not present

## 2020-10-22 DIAGNOSIS — Z0001 Encounter for general adult medical examination with abnormal findings: Secondary | ICD-10-CM

## 2020-10-22 DIAGNOSIS — E7800 Pure hypercholesterolemia, unspecified: Secondary | ICD-10-CM | POA: Diagnosis not present

## 2020-10-22 DIAGNOSIS — I10 Essential (primary) hypertension: Secondary | ICD-10-CM

## 2020-10-22 DIAGNOSIS — Z0000 Encounter for general adult medical examination without abnormal findings: Secondary | ICD-10-CM

## 2020-10-22 LAB — LIPID PANEL
Cholesterol: 182 mg/dL (ref 0–200)
HDL: 67.2 mg/dL (ref >39.00)
LDL Cholesterol: 102 mg/dL — ABNORMAL HIGH (ref 0–99)
NonHDL: 114.33
Total CHOL/HDL Ratio: 3
Triglycerides: 61 mg/dL (ref 0.0–149.0)
VLDL: 12.2 mg/dL (ref 0.0–40.0)

## 2020-10-22 LAB — CBC
HCT: 38.7 % (ref 36.0–46.0)
Hemoglobin: 12.9 g/dL (ref 12.0–15.0)
MCHC: 33.4 g/dL (ref 30.0–36.0)
MCV: 91 fl (ref 78.0–100.0)
Platelets: 234 10*3/uL (ref 150.0–400.0)
RBC: 4.26 Mil/uL (ref 3.87–5.11)
RDW: 14.2 % (ref 11.5–15.5)
WBC: 4.1 10*3/uL (ref 4.0–10.5)

## 2020-10-22 LAB — COMPREHENSIVE METABOLIC PANEL
ALT: 15 U/L (ref 0–35)
AST: 17 U/L (ref 0–37)
Albumin: 4 g/dL (ref 3.5–5.2)
Alkaline Phosphatase: 67 U/L (ref 39–117)
BUN: 16 mg/dL (ref 6–23)
CO2: 24 mEq/L (ref 19–32)
Calcium: 9.6 mg/dL (ref 8.4–10.5)
Chloride: 104 mEq/L (ref 96–112)
Creatinine, Ser: 0.88 mg/dL (ref 0.40–1.20)
GFR: 66.67 mL/min (ref >60.00)
Glucose, Bld: 82 mg/dL (ref 70–99)
Potassium: 3.7 mEq/L (ref 3.5–5.1)
Sodium: 138 mEq/L (ref 135–145)
Total Bilirubin: 0.4 mg/dL (ref 0.2–1.2)
Total Protein: 7.2 g/dL (ref 6.0–8.3)

## 2020-10-22 LAB — TSH: TSH: 1.48 u[IU]/mL (ref 0.35–5.50)

## 2020-10-22 MED ORDER — SEMAGLUTIDE-WEIGHT MANAGEMENT 0.5 MG/0.5ML ~~LOC~~ SOAJ: 0.5000 mg | SUBCUTANEOUS | 0 refills | Status: DC

## 2020-10-22 MED ORDER — SEMAGLUTIDE-WEIGHT MANAGEMENT 2.4 MG/0.75ML ~~LOC~~ SOAJ: 2.4000 mg | SUBCUTANEOUS | 0 refills | Status: DC

## 2020-10-22 MED ORDER — SEMAGLUTIDE-WEIGHT MANAGEMENT 1.7 MG/0.75ML ~~LOC~~ SOAJ: 1.7000 mg | SUBCUTANEOUS | 0 refills | Status: DC

## 2020-10-22 MED ORDER — SEMAGLUTIDE-WEIGHT MANAGEMENT 1 MG/0.5ML ~~LOC~~ SOAJ: 1.0000 mg | SUBCUTANEOUS | 0 refills | Status: DC

## 2020-10-22 MED ORDER — SEMAGLUTIDE-WEIGHT MANAGEMENT 0.25 MG/0.5ML ~~LOC~~ SOAJ: 0.2500 mg | SUBCUTANEOUS | 0 refills | Status: DC

## 2020-10-22 NOTE — Progress Notes (Signed)
Chief Complaint  Patient presents with   Annual Exam     Well Woman Cynthia Montgomery is here for a complete physical.   Her last physical was >1 year ago.  Current diet: in general, a "healthy" diet. Current exercise: walking, active in job. Weight is increasing and she denies daytime fatigue. Seatbelt? Yes  Class II obesity with severe features Patient is struggling with her weight.  Diet is healthy overall.  She tries to stay active and walks relatively routinely.  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  She has not had any chest pain or shortness of breath.  She is not interested in seeing a medical weight loss treatment but is interested in the medication to help her.  Her daughter was diagnosed with hypothyroidism at her last testing was normal at the end of 2021.   Health Maintenance Colonoscopy- Yes Shingrix- Yes DEXA- Yes Mammogram- Yes Tetanus- Yes Pneumonia- Yes Hep C screen- Yes  Past Medical History:  Diagnosis Date   Abnormal myocardial perfusion study 07/22/2018   Acute pharyngitis 09/26/2017   Adiposity 09/26/2013   AKI (acute kidney injury) 01/24/2017   Arthrofibrosis of knee joint, right 10/11/2013   Asthma    Chronic back pain 08/26/2019   Edema, unspecified 09/26/2013   Essential hypertension    Dx 30 ys ago; normal BP 130/60 (with med compliance)   Exertional chest pain 07/22/2018   Facet arthropathy, lumbosacral 06/15/2015   Gastro-esophageal reflux disease without esophagitis 09/26/2013   Gonalgia 09/26/2013   Heart murmur 02/27/2016   History of cataracts    bilateral; s/p surgery   History of colon polyps    Hyperlipidemia 04/23/2020   Hypokalemia 09/26/2013   Intervertebral disc degeneration, 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   Left knee pain 04/06/2007   Left thyroid nodule 10/28/2017   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   Nonintractable episodic headache 07/27/2017   Nuclear sclerotic cataract of left eye 07/13/2014   OA  (osteoarthritis) of knee    s/p left TKR 09/2010   OAB (overactive bladder) 07/19/2019   Obesity, Class II, BMI 35-39.9 08/26/2019   Positive TB test    Primary osteoarthritis of right knee 07/19/2013   Pure hypercholesterolemia 04/06/2007   Annotation: reports a history, and that she was on a medication for it   RBBB (right bundle branch block) 07/22/2018   S/P total knee replacement, right 08/26/2016   Snoring 08/26/2019   STOP BANG 4   Syncope 01/24/2017   Transaminitis 01/24/2017   Xerostomia 10/28/2017     Past Surgical History:  Procedure Laterality Date   CATARACT EXTRACTION, BILATERAL  2018   KNEE SURGERY     states 2 surgeries on each knee around 2016 and 2017   TUBAL LIGATION      Medications  Current Outpatient Medications on File Prior to Visit  Medication Sig Dispense Refill   albuterol (VENTOLIN HFA) 108 (90 Base) MCG/ACT inhaler Inhale 2 puffs into the lungs every 6 (six) hours as needed for wheezing or shortness of breath. 18 g 4   amLODipine (NORVASC) 5 MG tablet Take 1 tablet by mouth once daily 90 tablet 0   aspirin EC 81 MG tablet Take 81 mg by mouth daily.     celecoxib (CELEBREX) 100 MG capsule Take 1 capsule (100 mg total) by mouth 2 (two) times daily. 180 capsule 2   cholecalciferol (VITAMIN D) 400 units TABS tablet Take 400 Units by mouth daily.     COVID-19 mRNA vaccine, Pfizer,  30 MCG/0.3ML injection INJECT AS DIRECTED .3 mL 0   fluticasone (FLONASE) 50 MCG/ACT nasal spray Place 2 sprays into both nostrils daily. (Patient taking differently: Place 2 sprays into both nostrils daily as needed.) 16 g 6   influenza vaccine adjuvanted (FLUAD) 0.5 ML injection TAKE AS DIRECTED .5 mL 0   losartan (COZAAR) 100 MG tablet Take 1 tablet by mouth once daily 90 tablet 0   metoprolol succinate (TOPROL-XL) 50 MG 24 hr tablet TAKE 1 TABLET BY MOUTH ONCE DAILY TAKE  WITH  OR  IMMEDIATELY  FOLLOWING  A  MEAL 90 tablet 0   Multiple Vitamin (MULTIVITAMIN) tablet Take 1  tablet by mouth daily.     oxybutynin (DITROPAN-XL) 10 MG 24 hr tablet Take 1 tablet (10 mg total) by mouth at bedtime. 90 tablet 2   pantoprazole (PROTONIX) 40 MG tablet Take 1 tablet by mouth once daily 60 tablet 0   rosuvastatin (CRESTOR) 10 MG tablet Take 1 tablet by mouth once daily 90 tablet 0   nitroGLYCERIN (NITROSTAT) 0.4 MG SL tablet Place 1 tablet (0.4 mg total) under the tongue every 5 (five) minutes as needed for chest pain. 25 tablet 3    Allergies No Known Allergies  Review of Systems: Constitutional:  no fevers Eye:  no recent significant change in vision Ears:  No changes in hearing Nose/Mouth/Throat:  no complaints of nasal congestion, no sore throat Cardiovascular: no chest pain Respiratory:  No shortness of breath Gastrointestinal:  No change in bowel habits GU:  Female: negative for dysuria Integumentary:  no abnormal skin lesions reported Neurologic:  no headaches Endocrine:  denies unexplained weight loss  Exam BP 138/88   Pulse 60   Temp 97.6 F (36.4 C) (Oral)   Ht 5\' 2"  (1.575 m)   Wt 202 lb (91.6 kg)   SpO2 99%   BMI 36.95 kg/m  General:  well developed, well nourished, in no apparent distress Skin:  no significant moles, warts, or growths Head:  no masses, lesions, or tenderness Eyes:  pupils equal and round, sclera anicteric without injection Ears:  canals without lesions, TMs shiny without retraction, no obvious effusion, no erythema Nose:  nares patent, septum midline, mucosa normal, and no drainage or sinus tenderness Throat/Pharynx:  lips and gingiva without lesion; tongue and uvula midline; non-inflamed pharynx; no exudates or postnasal drainage Neck: neck supple without adenopathy, thyromegaly, or masses Lungs:  clear to auscultation, breath sounds equal bilaterally, no respiratory distress Cardio:  regular rate and rhythm, no bruits or LE edema Abdomen:  abdomen soft, nontender; bowel sounds normal; no masses or organomegaly Genital:  Deferred Neuro:  gait normal; deep tendon reflexes normal and symmetric Psych: well oriented with normal range of affect and appropriate judgment/insight  Assessment and Plan  Well adult exam  Pure hypercholesterolemia - Plan: Lipid panel, Comprehensive metabolic panel  Essential hypertension - Plan: CBC  Severe obesity (BMI 35.0-35.9 with comorbidity) (HCC) - Plan: Amb Referral to Bariatric Surgery, Semaglutide-Weight Management 0.25 MG/0.5ML SOAJ, Semaglutide-Weight Management 0.5 MG/0.5ML SOAJ, Semaglutide-Weight Management 1 MG/0.5ML SOAJ, Semaglutide-Weight Management 1.7 MG/0.75ML SOAJ, Semaglutide-Weight Management 2.4 MG/0.75ML SOAJ  Weight gain - Plan: TSH   Well 70 y.o. female. Counseled on diet and exercise. Other orders as above. Severe obesity: Chronic, unstable.  Refer to the bariatric surgery team.  We will see if insurance will cover [facility].  If they do not, we will start phentermine for short period time.  Follow-up in 5 weeks to recheck her weight. The patient  voiced understanding and agreement to the plan.  Jilda Roche Bristow, DO 10/22/20 10:23 AM

## 2020-10-22 NOTE — Patient Instructions (Addendum)
Give [place] 2-3 business days to get the results of your labs back.   Keep the diet clean and stay active. Add some weight resistance exercise to your routine.   Let me know if there are cost issues.   If you do not hear anything about your referral in the next 1-2 weeks, call our office and ask for an update.  Let us know if you need anything.  EXERCISES RANGE OF MOTION (ROM) AND STRETCHING EXERCISES  These exercises may help you when beginning to rehabilitate your issue. In order to successfully resolve your symptoms, you must improve your posture. These exercises are designed to help reduce the forward-head and rounded-shoulder posture which contributes to this condition. Your symptoms may resolve with or without further involvement from your physician, physical therapist or athletic trainer. While completing these exercises, remember:  Restoring tissue flexibility helps normal motion to return to the joints. This allows healthier, less painful movement and activity. An effective stretch should be held for at least 20 seconds, although you may need to begin with shorter hold times for comfort. A stretch should never be painful. You should only feel a gentle lengthening or release in the stretched tissue. Do not do any stretch or exercise that you cannot tolerate.  STRETCH- Axial Extensors Lie on your back on the floor. You may bend your knees for comfort. Place a rolled-up hand towel or dish towel, about 2 inches in diameter, under the part of your head that makes contact with the floor. Gently tuck your chin, as if trying to make a "double chin," until you feel a gentle stretch at the base of your head. Hold 15-20 seconds. Repeat 2-3 times. Complete this exercise 1 time per day.   STRETCH - Axial Extension  Stand or sit on a firm surface. Assume a good posture: chest up, shoulders drawn back, abdominal muscles slightly tense, knees unlocked (if standing) and feet hip width apart. Slowly  retract your chin so your head slides back and your chin slightly lowers. Continue to look straight ahead. You should feel a gentle stretch in the back of your head. Be certain not to feel an aggressive stretch since this can cause headaches later. Hold for 15-20 seconds. Repeat 2-3 times. Complete this exercise 1 time per day.  STRETCH - Cervical Side Bend  Stand or sit on a firm surface. Assume a good posture: chest up, shoulders drawn back, abdominal muscles slightly tense, knees unlocked (if standing) and feet hip width apart. Without letting your nose or shoulders move, slowly tip your right / left ear to your shoulder until your feel a gentle stretch in the muscles on the opposite side of your neck. Hold 15-20 seconds. Repeat 2-3 times. Complete this exercise 1-2 times per day.  STRETCH - Cervical Rotators  Stand or sit on a firm surface. Assume a good posture: chest up, shoulders drawn back, abdominal muscles slightly tense, knees unlocked (if standing) and feet hip width apart. Keeping your eyes level with the ground, slowly turn your head until you feel a gentle stretch along the back and opposite side of your neck. Hold 15-20 seconds. Repeat 2-3 times. Complete this exercise 1-2 times per day.  RANGE OF MOTION - Neck Circles  Stand or sit on a firm surface. Assume a good posture: chest up, shoulders drawn back, abdominal muscles slightly tense, knees unlocked (if standing) and feet hip width apart. Gently roll your head down and around from the back of one shoulder to  the back of the other. The motion should never be forced or painful. Repeat the motion 10-20 times, or until you feel the neck muscles relax and loosen. Repeat 2-3 times. Complete the exercise 1-2 times per day. STRENGTHENING EXERCISES - Cervical Strain and Sprain These exercises may help you when beginning to rehabilitate your injury. They may resolve your symptoms with or without further involvement from your  physician, physical therapist, or athletic trainer. While completing these exercises, remember:  Muscles can gain both the endurance and the strength needed for everyday activities through controlled exercises. Complete these exercises as instructed by your physician, physical therapist, or athletic trainer. Progress the resistance and repetitions only as guided. You may experience muscle soreness or fatigue, but the pain or discomfort you are trying to eliminate should never worsen during these exercises. If this pain does worsen, stop and make certain you are following the directions exactly. If the pain is still present after adjustments, discontinue the exercise until you can discuss the trouble with your clinician.  STRENGTH - Cervical Flexors, Isometric Face a wall, standing about 6 inches away. Place a small pillow, a ball about 6-8 inches in diameter, or a folded towel between your forehead and the wall. Slightly tuck your chin and gently push your forehead into the soft object. Push only with mild to moderate intensity, building up tension gradually. Keep your jaw and forehead relaxed. Hold 10 to 20 seconds. Keep your breathing relaxed. Release the tension slowly. Relax your neck muscles completely before you start the next repetition. Repeat 2-3 times. Complete this exercise 1 time per day.  STRENGTH- Cervical Lateral Flexors, Isometric  Stand about 6 inches away from a wall. Place a small pillow, a ball about 6-8 inches in diameter, or a folded towel between the side of your head and the wall. Slightly tuck your chin and gently tilt your head into the soft object. Push only with mild to moderate intensity, building up tension gradually. Keep your jaw and forehead relaxed. Hold 10 to 20 seconds. Keep your breathing relaxed. Release the tension slowly. Relax your neck muscles completely before you start the next repetition. Repeat 2-3 times. Complete this exercise 1 time per  day.  STRENGTH - Cervical Extensors, Isometric  Stand about 6 inches away from a wall. Place a small pillow, a ball about 6-8 inches in diameter, or a folded towel between the back of your head and the wall. Slightly tuck your chin and gently tilt your head back into the soft object. Push only with mild to moderate intensity, building up tension gradually. Keep your jaw and forehead relaxed. Hold 10 to 20 seconds. Keep your breathing relaxed. Release the tension slowly. Relax your neck muscles completely before you start the next repetition. Repeat 2-3 times. Complete this exercise 1 time per day.  POSTURE AND BODY MECHANICS CONSIDERATIONS Keeping correct posture when sitting, standing or completing your activities will reduce the stress put on different body tissues, allowing injured tissues a chance to heal and limiting painful experiences. The following are general guidelines for improved posture. Your physician or physical therapist will provide you with any instructions specific to your needs. While reading these guidelines, remember: The exercises prescribed by your provider will help you have the flexibility and strength to maintain correct postures. The correct posture provides the optimal environment for your joints to work. All of your joints have less wear and tear when properly supported by a spine with good posture. This means you will experience  a healthier, less painful body. Correct posture must be practiced with all of your activities, especially prolonged sitting and standing. Correct posture is as important when doing repetitive low-stress activities (typing) as it is when doing a single heavy-load activity (lifting).  PROLONGED STANDING WHILE SLIGHTLY LEANING FORWARD When completing a task that requires you to lean forward while standing in one place for a long time, place either foot up on a stationary 2- to 4-inch high object to help maintain the best posture. When both feet are  on the ground, the low back tends to lose its slight inward curve. If this curve flattens (or becomes too large), then the back and your other joints will experience too much stress, fatigue more quickly, and can cause pain.   RESTING POSITIONS Consider which positions are most painful for you when choosing a resting position. If you have pain with flexion-based activities (sitting, bending, stooping, squatting), choose a position that allows you to rest in a less flexed posture. You would want to avoid curling into a fetal position on your side. If your pain worsens with extension-based activities (prolonged standing, working overhead), avoid resting in an extended position such as sleeping on your stomach. Most people will find more comfort when they rest with their spine in a more neutral position, neither too rounded nor too arched. Lying on a non-sagging bed on your side with a pillow between your knees, or on your back with a pillow under your knees will often provide some relief. Keep in mind, being in any one position for a prolonged period of time, no matter how correct your posture, can still lead to stiffness.  WALKING Walk with an upright posture. Your ears, shoulders, and hips should all line up. OFFICE WORK When working at a desk, create an environment that supports good, upright posture. Without extra support, muscles fatigue and lead to excessive strain on joints and other tissues.  CHAIR: A chair should be able to slide under your desk when your back makes contact with the back of the chair. This allows you to work closely. The chair's height should allow your eyes to be level with the upper part of your monitor and your hands to be slightly lower than your elbows. Body position: Your feet should make contact with the floor. If this is not possible, use a foot rest. Keep your ears over your shoulders. This will reduce stress on your neck and low back.

## 2020-10-23 ENCOUNTER — Telehealth: Payer: Self-pay | Admitting: Family Medicine

## 2020-10-23 NOTE — Telephone Encounter (Signed)
Initiated PA for [employer] on Covermymeds. [id number] Awaiting response

## 2020-10-26 NOTE — Telephone Encounter (Signed)
Cynthia Montgomery was denied by insurance. Patient had only tried/failed OTC medications. Called the patient to inform--left message to call back.

## 2020-10-30 ENCOUNTER — Other Ambulatory Visit: Payer: Self-pay | Admitting: Family Medicine

## 2020-10-30 MED ORDER — PHENTERMINE HCL 37.5 MG PO CAPS: 37.5000 mg | ORAL_CAPSULE | ORAL | 0 refills | Status: DC

## 2020-10-30 NOTE — Telephone Encounter (Signed)
Called informed the patient of denial and she would like something else sent in to replace the [facility]

## 2020-10-31 NOTE — Telephone Encounter (Signed)
Patient called back and was informed.

## 2020-10-31 NOTE — Telephone Encounter (Signed)
I had already sent something in as we had anticipated insurance failing to cover this. Ty.

## 2020-10-31 NOTE — Telephone Encounter (Signed)
Called left message to call back 

## 2020-11-14 DIAGNOSIS — K635 Polyp of colon: Secondary | ICD-10-CM | POA: Insufficient documentation

## 2020-11-14 DIAGNOSIS — H269 Unspecified cataract: Secondary | ICD-10-CM | POA: Insufficient documentation

## 2020-11-19 NOTE — Progress Notes (Signed)
Cardiology Office Note:    Date:  11/20/2020   ID:  Cynthia Montgomery, DOB [birth year], MRN [id number]  PCP:  Sharlene Dory, DO  Cardiologist:  Norman Herrlich, MD    Referring MD: Sharlene Dory*    ASSESSMENT:    1. Mild CAD   2. Essential hypertension   3. Hyperlipidemia, unspecified hyperlipidemia type   4. RBBB    PLAN:    In order of problems listed above:  Stable having no angina and is on appropriate antithrombotic antihypertensive and lipid-lowering treatment.  I am pleased with her activity level this time does not require repeat ischemia evaluation with mild nonobstructive CAD Continue her current antihypertensives I have asked her to check her home blood pressure with good technique at least twice a week and if she is having systolics above target to contact me or her PCP and continue her calcium channel blocker beta-blocker ARB Stable good result continue high intensity statin with CAD Stable EKG pattern   Next appointment: 18 months   Medication Adjustments/Labs and Tests Ordered: Current medicines are reviewed at length with the patient today.  Concerns regarding medicines are outlined above.  Orders Placed This Encounter  Procedures   EKG 12-Lead    No orders of the defined types were placed in this encounter.   Chief Complaint  Patient presents with   Follow-up    Mild CAD on cardiac CTA performed 2020     History of Present Illness:    Cynthia Montgomery is a 70 y.o. female with a hx of mild CAD hypertension hyperlipidemia and right bundle branch block.  Cardiac CTA performed 09/21/2018 showed a calcium score of 50 67th percentile and mild nonobstructive CAD less than 25% in the proximal left anterior descending coronary artery.  She was last seen 08/29/2019. 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 She tolerates her statin without muscle pain or weakness. Sporadically checks her blood pressure which tends to run in the range of 140/80. She is compliant with her medications including aspirin high intensity statin and antihypertensives.  Past Medical History:  Diagnosis Date   Abnormal myocardial perfusion study 07/22/2018   Acute pharyngitis 09/26/2017   Adiposity 09/26/2013   AKI (acute kidney injury) 01/24/2017   Arthrofibrosis of knee joint, right 10/11/2013   Asthma    Chronic back pain 08/26/2019   Edema, unspecified 09/26/2013   Essential hypertension    Dx 30 ys ago; normal BP 130/60 (with med compliance)   Exertional chest pain 07/22/2018   Facet arthropathy, lumbosacral 06/15/2015   Gastro-esophageal reflux disease without esophagitis 09/26/2013   Gonalgia 09/26/2013   Heart murmur 02/27/2016   History of cataracts    bilateral; s/p surgery   History of colon polyps    Hyperlipidemia 04/23/2020   Hypokalemia 09/26/2013   Intervertebral disc degeneration, 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   Left knee pain 04/06/2007   Left thyroid nodule 10/28/2017   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   Nonintractable episodic headache 07/27/2017   Nuclear sclerotic cataract of left eye 07/13/2014   OA (osteoarthritis) of knee    s/p left TKR 09/2010   OAB (overactive bladder) 07/19/2019   Obesity, Class II, BMI 35-39.9 08/26/2019   Positive TB test    Primary osteoarthritis of right knee 07/19/2013   Pure hypercholesterolemia 04/06/2007   Annotation: reports a history, and that she was on a  medication for it   RBBB (right bundle branch block) 07/22/2018   S/P total knee replacement, right 08/26/2016   Snoring 08/26/2019   STOP BANG 4   Syncope 01/24/2017   Transaminitis 01/24/2017   Xerostomia 10/28/2017    Past Surgical History:  Procedure Laterality Date   CATARACT EXTRACTION, BILATERAL  2018   KNEE SURGERY     states 2 surgeries on each knee around 2016 and 2017   TUBAL  LIGATION      Current Medications: No outpatient medications have been marked as taking for the 11/20/20 encounter (Office Visit) with Baldo Daub, MD.     Allergies:   Patient has no known allergies.   Social History   Socioeconomic History   Marital status: Married    Spouse name: Not on file   Number of children: Not on file   Years of education: 12   Highest education level: High school graduate  Occupational History   Occupation: Retired  Tobacco Use   Smoking status: Former    Types: Cigarettes   Smokeless tobacco: Never   Tobacco comments:    quit 30 years ago, 1980  Vaping Use   Vaping Use: Never used  Substance and Sexual Activity   Alcohol use: No   Drug use: No   Sexual activity: Yes    Birth control/protection: None  Other Topics Concern   Not on file  Social History Narrative   Not on file   Social Determinants of Health   Financial Resource Strain: Not on file  Food Insecurity: Not on file  Transportation Needs: Not on file  Physical Activity: Not on file  Stress: Not on file  Social Connections: Not on file     Family History: The patient's family history includes Arthritis in her daughter and mother; Asthma in her daughter; Cancer in her father; Diabetes in her sister; Hypertension in her mother and sister; Kidney disease in her daughter; Memory loss in her maternal aunt, mother, and sister; Pancreatic cancer in her father. There is no history of Colon cancer, Colon polyps, Esophageal cancer, Stomach cancer, or Rectal cancer. ROS:   Please see the history of present illness.    All other systems reviewed and are negative.  EKGs/Labs/Other Studies Reviewed:    The following studies were reviewed today:  EKG:  EKG ordered today and personally reviewed.  The ekg ordered today demonstrates sinus rhythm right bundle branch block left axis deviation unchanged June 2021  Recent Labs: 10/22/2020: ALT 15; BUN 16; Creatinine, Ser 0.88; Hemoglobin  12.9; Platelets 234.0; Potassium 3.7; Sodium 138; TSH 1.48  Recent Lipid Panel    Component Value Date/Time   CHOL 182 10/22/2020 0932   CHOL 170 08/29/2019 1054   TRIG 61.0 10/22/2020 0932   HDL 67.20 10/22/2020 0932   HDL 68 08/29/2019 1054   CHOLHDL 3 10/22/2020 0932   VLDL 12.2 10/22/2020 0932   LDLCALC 102 (H) 10/22/2020 0932   LDLCALC 85 08/29/2019 1054    Physical Exam:    VS:  BP (!) 145/86 (BP Location: Right Arm, Patient Position: Sitting)   Pulse 71   Ht 5\' 2"  (1.575 m)   Wt 199 lb (90.3 kg)   SpO2 95%   BMI 36.40 kg/m     Wt Readings from Last 3 Encounters:  11/20/20 199 lb (90.3 kg)  10/22/20 202 lb (91.6 kg)  07/11/20 200 lb (90.7 kg)     GEN:  Well nourished, well developed in no acute distress  HEENT: Normal NECK: No JVD; No carotid bruits LYMPHATICS: No lymphadenopathy CARDIAC: RRR, no murmurs, rubs, gallops RESPIRATORY:  Clear to auscultation without rales, wheezing or rhonchi  ABDOMEN: Soft, non-tender, non-distended MUSCULOSKELETAL:  No edema; No deformity  SKIN: Warm and dry NEUROLOGIC:  Alert and oriented x 3 PSYCHIATRIC:  Normal affect    Signed, Norman Herrlich, MD  11/20/2020 8:51 AM    [facility]

## 2020-11-20 ENCOUNTER — Ambulatory Visit (INDEPENDENT_AMBULATORY_CARE_PROVIDER_SITE_OTHER): Payer: Medicare Other | Admitting: Cardiology

## 2020-11-20 ENCOUNTER — Encounter: Payer: Self-pay | Admitting: Cardiology

## 2020-11-20 ENCOUNTER — Other Ambulatory Visit: Payer: Self-pay

## 2020-11-20 VITALS — BP 145/86 | HR 71 | Ht 62.0 in | Wt 199.0 lb

## 2020-11-20 DIAGNOSIS — I4510 Unspecified right bundle-branch block: Secondary | ICD-10-CM | POA: Diagnosis not present

## 2020-11-20 DIAGNOSIS — I10 Essential (primary) hypertension: Secondary | ICD-10-CM

## 2020-11-20 DIAGNOSIS — E785 Hyperlipidemia, unspecified: Secondary | ICD-10-CM

## 2020-11-20 DIAGNOSIS — I2510 Atherosclerotic heart disease of native coronary artery without angina pectoris: Secondary | ICD-10-CM

## 2020-11-20 MED ORDER — ROSUVASTATIN CALCIUM 10 MG PO TABS: 10.0000 mg | ORAL_TABLET | Freq: Every day | ORAL | 3 refills | Status: DC

## 2020-11-20 MED ORDER — METOPROLOL SUCCINATE ER 50 MG PO TB24: ORAL_TABLET | ORAL | 3 refills | Status: DC

## 2020-11-20 MED ORDER — NITROGLYCERIN 0.4 MG SL SUBL: 0.4000 mg | SUBLINGUAL_TABLET | SUBLINGUAL | 3 refills | Status: DC | PRN

## 2020-11-20 MED ORDER — AMLODIPINE BESYLATE 5 MG PO TABS: 5.0000 mg | ORAL_TABLET | Freq: Every day | ORAL | 3 refills | Status: DC

## 2020-11-20 MED ORDER — LOSARTAN POTASSIUM 100 MG PO TABS: 100.0000 mg | ORAL_TABLET | Freq: Every day | ORAL | 3 refills | Status: DC

## 2020-11-20 NOTE — Patient Instructions (Signed)
Medication Instructions:  Your physician recommends that you continue on your current medications as directed. Please refer to the Current Medication list given to you today.  *If you need a refill on your cardiac medications before your next appointment, please call your pharmacy*   Lab Work: None If you have labs (blood work) drawn today and your tests are completely normal, you will receive your results only by: MyChart Message (if you have MyChart) OR A paper copy in the mail If you have any lab test that is abnormal or we need to change your treatment, we will call you to review the results.   Testing/Procedures: None   Follow-Up: At [facility], you and your health needs are our priority.  As part of our continuing mission to provide you with exceptional heart care, we have created designated Provider Care Teams.  These Care Teams include your primary Cardiologist (physician) and Advanced Practice Providers (APPs -  Physician Assistants and Nurse Practitioners) who all work together to provide you with the care you need, when you need it.  We recommend signing up for the patient portal called "MyChart".  Sign up information is provided on this After Visit Summary.  MyChart is used to connect with patients for Virtual Visits (Telemedicine).  Patients are able to view lab/test results, encounter notes, upcoming appointments, etc.  Non-urgent messages can be sent to your provider as well.   To learn more about what you can do with MyChart, go to [email or web address].    Your next appointment:   1.5 year(s)  The format for your next appointment:   In Person  Provider:   Norman Herrlich, MD   Other Instructions

## 2020-11-27 ENCOUNTER — Ambulatory Visit (INDEPENDENT_AMBULATORY_CARE_PROVIDER_SITE_OTHER): Payer: Medicare HMO | Admitting: Family Medicine

## 2020-11-27 ENCOUNTER — Other Ambulatory Visit: Payer: Self-pay

## 2020-11-27 ENCOUNTER — Encounter: Payer: Self-pay | Admitting: Family Medicine

## 2020-11-27 VITALS — BP 140/82 | HR 60 | Temp 98.0°F | Ht 62.0 in | Wt 198.4 lb

## 2020-11-27 DIAGNOSIS — Z6835 Body mass index (BMI) 35.0-35.9, adult: Secondary | ICD-10-CM | POA: Diagnosis not present

## 2020-11-27 DIAGNOSIS — K219 Gastro-esophageal reflux disease without esophagitis: Secondary | ICD-10-CM | POA: Diagnosis not present

## 2020-11-27 MED ORDER — PHENTERMINE HCL 37.5 MG PO CAPS: 37.5000 mg | ORAL_CAPSULE | ORAL | 0 refills | Status: DC

## 2020-11-27 MED ORDER — PANTOPRAZOLE SODIUM 40 MG PO TBEC: 40.0000 mg | DELAYED_RELEASE_TABLET | Freq: Every day | ORAL | 1 refills | Status: DC

## 2020-11-27 NOTE — Patient Instructions (Addendum)
The only lifestyle changes that have data behind them are weight loss for the overweight/obese and elevating the head of the bed. Finding out which foods/positions are triggers is important.  Keep the diet clean and stay active.  I recommend getting the flu shot in mid October. This suggestion would change if the CDC comes out with a different recommendation.   Let us know if you need anything.

## 2020-11-27 NOTE — Progress Notes (Signed)
Chief Complaint  Patient presents with   Follow-up    Subjective: Patient is a 70 y.o. female here for f/u obesity.  Started on Phentermine 37.5 mg/d after insurance refused to cover Wegovy or Ozempic. She reports compliance w/o AE's. She is down around 4 lbs. Diet is fair, her portions have decreased as her appetite has gone down.  Reflux Patient has intermittent reflux symptoms.  She takes Protonix 40 mg daily as needed.  No adverse effects.  She denies nausea, vomiting, bleeding, unintentional weight loss, bowel changes.  Peppers seem to flare her symptoms the most.  She will use an occasional Tums as well.  Past Medical History:  Diagnosis Date   Abnormal myocardial perfusion study 07/22/2018   Acute pharyngitis 09/26/2017   Adiposity 09/26/2013   AKI (acute kidney injury) 01/24/2017   Arthrofibrosis of knee joint, right 10/11/2013   Asthma    Chronic back pain 08/26/2019   Edema, unspecified 09/26/2013   Essential hypertension    Dx 30 ys ago; normal BP 130/60 (with med compliance)   Exertional chest pain 07/22/2018   Facet arthropathy, lumbosacral 06/15/2015   Gastro-esophageal reflux disease without esophagitis 09/26/2013   Gonalgia 09/26/2013   Heart murmur 02/27/2016   History of cataracts    bilateral; s/p surgery   History of colon polyps    Hyperlipidemia 04/23/2020   Hypokalemia 09/26/2013   Intervertebral disc degeneration, 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   Left knee pain 04/06/2007   Left thyroid nodule 10/28/2017   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   Nonintractable episodic headache 07/27/2017   Nuclear sclerotic cataract of left eye 07/13/2014   OA (osteoarthritis) of knee    s/p left TKR 09/2010   OAB (overactive bladder) 07/19/2019   Obesity, Class II, BMI 35-39.9 08/26/2019   Positive TB test    Primary osteoarthritis of right knee 07/19/2013   Pure hypercholesterolemia 04/06/2007   Annotation: reports a history, and that she was on a medication for it   RBBB  (right bundle branch block) 07/22/2018   S/P total knee replacement, right 08/26/2016   Snoring 08/26/2019   STOP BANG 4   Syncope 01/24/2017   Transaminitis 01/24/2017   Xerostomia 10/28/2017    Objective: BP 140/82   Pulse 60   Temp 98 F (36.7 C) (Oral)   Ht 5\' 2"  (1.575 m)   Wt 198 lb 6 oz (90 kg)   SpO2 98%   BMI 36.28 kg/m  General: Awake, appears stated age Abdomen: Bowel sounds present, soft, nontender, nondistended. Heart: RRR, no murmurs Lungs: CTAB, no rales, wheezes or rhonchi. No accessory muscle use Psych: Age appropriate judgment and insight, normal affect and mood  Assessment and Plan: Severe obesity (BMI 35.0-35.9 with comorbidity) (HCC) - Plan: phentermine 37.5 MG capsule, phentermine 37.5 MG capsule  Gastroesophageal reflux disease, unspecified whether esophagitis present - Plan: pantoprazole (PROTONIX) 40 MG tablet  Chronic, stable.  Counseled on diet and exercise.  Continue to wear months of phentermine 37.5 mg daily.  Then we will stop. Chronic, stable.  Continue Protonix 40 mg daily as needed.  Counseled on reflux precautions. Follow-up in 5 months for medication check or as needed. The patient voiced understanding and agreement to the plan.  09-08-1978 [place], DO 11/27/20  9:28 AM

## 2020-12-10 ENCOUNTER — Other Ambulatory Visit (HOSPITAL_BASED_OUTPATIENT_CLINIC_OR_DEPARTMENT_OTHER): Payer: Self-pay

## 2020-12-10 ENCOUNTER — Ambulatory Visit: Payer: Medicare HMO | Attending: Internal Medicine

## 2020-12-10 DIAGNOSIS — Z23 Encounter for immunization: Secondary | ICD-10-CM

## 2020-12-10 MED ORDER — FLUAD QUADRIVALENT 0.5 ML IM PRSY
PREFILLED_SYRINGE | INTRAMUSCULAR | 0 refills | Status: DC
  Filled 2020-12-10: qty 0.5, 1d supply, fill #0

## 2020-12-10 NOTE — Progress Notes (Signed)
   Covid-19 Vaccination Clinic  Name:  Cynthia Montgomery    MRN: [id number] DOB: [birth year]  12/10/2020  [name] was observed post Covid-19 immunization for 15 minutes without incident. She was provided with Vaccine Information Sheet and instruction to access the V-Safe system.   [name] was instructed to call 911 with any severe reactions post vaccine: Difficulty breathing  Swelling of face and throat  A fast heartbeat  A bad rash all over body  Dizziness and weakness

## 2020-12-17 ENCOUNTER — Other Ambulatory Visit (HOSPITAL_BASED_OUTPATIENT_CLINIC_OR_DEPARTMENT_OTHER): Payer: Self-pay

## 2020-12-17 MED ORDER — COVID-19MRNA BIVAL VACC PFIZER 30 MCG/0.3ML IM SUSP
INTRAMUSCULAR | 0 refills | Status: DC
  Filled 2020-12-17: qty 0.3, 1d supply, fill #0

## 2020-12-31 ENCOUNTER — Telehealth: Payer: Self-pay | Admitting: Family Medicine

## 2020-12-31 NOTE — Telephone Encounter (Signed)
Left message for patient to call back and schedule Medicare Annual Wellness Visit (AWV) in office.   If not able to come in office, please offer to do virtually or by telephone.  Left office number and my jabber #[phone].  Last AWV:12/21/2018  Please schedule at anytime with Nurse Health Advisor.   

## 2021-01-02 ENCOUNTER — Other Ambulatory Visit: Payer: Self-pay

## 2021-01-02 DIAGNOSIS — Z6835 Body mass index (BMI) 35.0-35.9, adult: Secondary | ICD-10-CM

## 2021-01-03 NOTE — Telephone Encounter (Signed)
Should already be at pharmacy as I had to send separately in early Sept. If it isn't there, let me know. Ty.

## 2021-01-15 ENCOUNTER — Encounter: Payer: Self-pay | Admitting: General Practice

## 2021-02-18 DIAGNOSIS — H43813 Vitreous degeneration, bilateral: Secondary | ICD-10-CM | POA: Diagnosis not present

## 2021-02-22 ENCOUNTER — Other Ambulatory Visit: Payer: Self-pay | Admitting: Family Medicine

## 2021-02-22 DIAGNOSIS — H52209 Unspecified astigmatism, unspecified eye: Secondary | ICD-10-CM | POA: Diagnosis not present

## 2021-02-22 DIAGNOSIS — Z6835 Body mass index (BMI) 35.0-35.9, adult: Secondary | ICD-10-CM

## 2021-02-22 DIAGNOSIS — H524 Presbyopia: Secondary | ICD-10-CM | POA: Diagnosis not present

## 2021-02-22 DIAGNOSIS — H5213 Myopia, bilateral: Secondary | ICD-10-CM | POA: Diagnosis not present

## 2021-02-23 ENCOUNTER — Encounter: Payer: Self-pay | Admitting: Family Medicine

## 2021-02-23 DIAGNOSIS — K219 Gastro-esophageal reflux disease without esophagitis: Secondary | ICD-10-CM

## 2021-02-23 DIAGNOSIS — I10 Essential (primary) hypertension: Secondary | ICD-10-CM

## 2021-02-25 ENCOUNTER — Other Ambulatory Visit: Payer: Self-pay | Admitting: Family Medicine

## 2021-02-25 MED ORDER — LOSARTAN POTASSIUM 100 MG PO TABS: 100.0000 mg | ORAL_TABLET | Freq: Every day | ORAL | 3 refills | Status: DC

## 2021-02-25 MED ORDER — PANTOPRAZOLE SODIUM 40 MG PO TBEC: 40.0000 mg | DELAYED_RELEASE_TABLET | Freq: Every day | ORAL | 1 refills | Status: DC

## 2021-03-26 ENCOUNTER — Ambulatory Visit (INDEPENDENT_AMBULATORY_CARE_PROVIDER_SITE_OTHER): Payer: Medicare Other

## 2021-03-26 ENCOUNTER — Other Ambulatory Visit: Payer: Self-pay

## 2021-03-26 ENCOUNTER — Telehealth: Payer: Self-pay | Admitting: Family Medicine

## 2021-03-26 DIAGNOSIS — Z0000 Encounter for general adult medical examination without abnormal findings: Secondary | ICD-10-CM

## 2021-03-26 NOTE — Telephone Encounter (Signed)
Have scheduled her tomorrow at 11:45.

## 2021-03-26 NOTE — Progress Notes (Addendum)
Virtual Visit via Telephone Note  I connected with  Cynthia Montgomery on 03/26/21 at  3:15 PM EST by telephone and verified that I am speaking with the correct person using two identifiers.  Medicare Annual Wellness visit completed telephonically due to Covid-19 pandemic.   Persons participating in this call: This Health Coach and this patient.   Location: Patient: home Provider: office   I discussed the limitations, risks, security and privacy concerns of performing an evaluation and management service by telephone and the availability of in person appointments. The patient expressed understanding and agreed to proceed.  Unable to perform video visit due to video visit attempted and failed and/or patient does not have video capability.   Some vital signs may be absent or patient reported.   Cynthia Schlein, LPN   Subjective:   Cynthia Montgomery is a 71 y.o. female who presents for Medicare Annual (Subsequent) preventive examination.  Review of Systems     Cardiac Risk Factors include: advanced age (>53men, >41 women);hypertension;dyslipidemia;obesity (BMI >30kg/m2)     Objective:    There were no vitals filed for this visit. There is no height or weight on file to calculate BMI.  Advanced Directives 03/26/2021 05/02/2020 12/21/2018 12/14/2017 09/26/2017  Does Patient Have a Medical Advance Directive? No No No No No  Would patient like information on creating a medical advance directive? No - Patient declined - No - Patient declined No - Patient declined No - Patient declined    Current Medications (verified) Outpatient Encounter Medications as of 03/26/2021  Medication Sig   aspirin EC 81 MG tablet Take 81 mg by mouth daily.   celecoxib (CELEBREX) 100 MG capsule Take 1 capsule (100 mg total) by mouth 2 (two) times daily.   cholecalciferol (VITAMIN D) 400 units TABS tablet Take 400 Units by mouth daily.   oxybutynin (DITROPAN-XL) 10 MG 24 hr tablet Take 1 tablet (10 mg total) by  mouth at bedtime.   pantoprazole (PROTONIX) 40 MG tablet Take 1 tablet (40 mg total) by mouth daily.   albuterol (VENTOLIN HFA) 108 (90 Base) MCG/ACT inhaler Inhale 2 puffs into the lungs every 6 (six) hours as needed for wheezing or shortness of breath.   amLODipine (NORVASC) 5 MG tablet Take 1 tablet (5 mg total) by mouth daily.   COVID-19 mRNA bivalent vaccine, Pfizer, injection Inject into the muscle.   fluticasone (FLONASE) 50 MCG/ACT nasal spray Place 2 sprays into both nostrils daily. (Patient taking differently: Place 2 sprays into both nostrils daily as needed.)   influenza vaccine adjuvanted (FLUAD QUADRIVALENT) 0.5 ML injection Inject into the muscle.   losartan (COZAAR) 100 MG tablet Take 1 tablet (100 mg total) by mouth daily.   metoprolol succinate (TOPROL-XL) 50 MG 24 hr tablet TAKE 1 TABLET BY MOUTH ONCE DAILY TAKE  WITH  OR  IMMEDIATELY  FOLLOWING  A  MEAL   Multiple Vitamin (MULTIVITAMIN) tablet Take 1 tablet by mouth daily.   nitroGLYCERIN (NITROSTAT) 0.4 MG SL tablet Place 1 tablet (0.4 mg total) under the tongue every 5 (five) minutes as needed for chest pain.   rosuvastatin (CRESTOR) 10 MG tablet Take 1 tablet (10 mg total) by mouth daily.   [DISCONTINUED] phentermine 37.5 MG capsule Take 1 capsule (37.5 mg total) by mouth every morning.   [DISCONTINUED] phentermine 37.5 MG capsule Take 1 capsule (37.5 mg total) by mouth every morning.   No facility-administered encounter medications on file as of 03/26/2021.    Allergies (verified) Patient  has no known allergies.   History: Past Medical History:  Diagnosis Date   Abnormal myocardial perfusion study 07/22/2018   Acute pharyngitis 09/26/2017   Adiposity 09/26/2013   AKI (acute kidney injury) 01/24/2017   Arthrofibrosis of knee joint, right 10/11/2013   Asthma    Chronic back pain 08/26/2019   Edema, unspecified 09/26/2013   Essential hypertension    Dx 30 ys ago; normal BP 130/60 (with med compliance)   Exertional  chest pain 07/22/2018   Facet arthropathy, lumbosacral 06/15/2015   Gastro-esophageal reflux disease without esophagitis 09/26/2013   Gonalgia 09/26/2013   Heart murmur 02/27/2016   History of cataracts    bilateral; s/p surgery   History of colon polyps    Hyperlipidemia 04/23/2020   Hypokalemia 09/26/2013   Intervertebral disc degeneration, 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   Left knee pain 04/06/2007   Left thyroid nodule 10/28/2017   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   Nonintractable episodic headache 07/27/2017   Nuclear sclerotic cataract of left eye 07/13/2014   OA (osteoarthritis) of knee    s/p left TKR 09/2010   OAB (overactive bladder) 07/19/2019   Obesity, Class II, BMI 35-39.9 08/26/2019   Positive TB test    Primary osteoarthritis of right knee 07/19/2013   Pure hypercholesterolemia 04/06/2007   Annotation: reports a history, and that she was on a medication for it   RBBB (right bundle branch block) 07/22/2018   S/P total knee replacement, right 08/26/2016   Snoring 08/26/2019   STOP BANG 4   Syncope 01/24/2017   Transaminitis 01/24/2017   Xerostomia 10/28/2017   Past Surgical History:  Procedure Laterality Date   CATARACT EXTRACTION, BILATERAL  2018   KNEE SURGERY     states 2 surgeries on each knee around 2016 and 2017   TUBAL LIGATION     Family History  Problem Relation Age of Onset   Arthritis Mother    Hypertension Mother    Memory loss Mother    Cancer Father    Pancreatic cancer Father    Diabetes Sister    Hypertension Sister    Memory loss Sister    Arthritis Daughter    Asthma Daughter    Kidney disease Daughter    Memory loss Maternal Aunt    Colon cancer Neg Hx    Colon polyps Neg Hx    Esophageal cancer Neg Hx    Stomach cancer Neg Hx    Rectal cancer Neg Hx    Social History   Socioeconomic History   Marital status: Married    Spouse name: Not on file   Number of children: Not on file   Years of education: 12   Highest education level:  High school graduate  Occupational History   Occupation: Retired  Tobacco Use   Smoking status: Former    Types: Cigarettes   Smokeless tobacco: Never   Tobacco comments:    quit 30 years ago, 1980  Vaping Use   Vaping Use: Never used  Substance and Sexual Activity   Alcohol use: No   Drug use: No   Sexual activity: Yes    Birth control/protection: None  Other Topics Concern   Not on file  Social History Narrative   Not on file   Social Determinants of Health   Financial Resource Strain: Low Risk    Difficulty of Paying Living Expenses: Not hard at all  Food Insecurity: No Food Insecurity   Worried About Radiation protection practitionerunning Out of Food in the Last Year: Never true  Ran Out of Food in the Last Year: Never true  Transportation Needs: No Transportation Needs   Lack of Transportation (Medical): No   Lack of Transportation (Non-Medical): No  Physical Activity: Inactive   Days of Exercise per Week: 0 days   Minutes of Exercise per Session: 0 min  Stress: No Stress Concern Present   Feeling of Stress : Not at all  Social Connections: Moderately Isolated   Frequency of Communication with Friends and Family: More than three times a week   Frequency of Social Gatherings with Friends and Family: More than three times a week   Attends Religious Services: More than 4 times per year   Active Member of [employer] or Organizations: No   Attends BankerClub or Organization Meetings: Never   Marital Status: Separated    Tobacco Counseling Counseling given: Not Answered Tobacco comments: quit 30 years ago, 1980   Clinical Intake:  Pre-visit preparation completed: Yes  Pain : No/denies pain     BMI - recorded: 36.28 Nutritional Status: BMI > 30  Obese Nutritional Risks: None Diabetes: No  How often do you need to have someone help you when you read instructions, pamphlets, or other written materials from your doctor or pharmacy?: 1 - Never  Diabetic?No  Interpreter Needed?: No  Information  entered by :: Lanier Ensignina Dawana Asper, LPN   Activities of Daily Living In your present state of health, do you have any difficulty performing the following activities: 03/26/2021  Hearing? N  Vision? N  Difficulty concentrating or making decisions? N  Walking or climbing stairs? N  Dressing or bathing? N  Doing errands, shopping? N  Preparing Food and eating ? N  Using the Toilet? N  In the past six months, have you accidently leaked urine? N  Do you have problems with loss of bowel control? N  Managing your Medications? N  Managing your Finances? N  Housekeeping or managing your Housekeeping? N  Some recent data might be hidden    Patient Care Team: Sharlene DoryWendling, Nicholas Paul, DO as PCP - General (Family Medicine) Dulce SellarMunley, Iline OvenBrian J, MD as PCP - Cardiology (Cardiology)  Indicate any recent Medical Services you may have received from other than [facility] providers in the past year (date may be approximate).     Assessment:   This is a routine wellness examination for [place].  Hearing/Vision screen Hearing Screening - Comments:: Pt denies any hearing issues  Vision Screening - Comments:: Pt follows up with provider in [place]   Dietary issues and exercise activities discussed: Current Exercise Habits: The patient does not participate in regular exercise at present   Goals Addressed             This Visit's Progress    Patient Stated       Start exercising to lose weight        Depression Screen PHQ 2/9 Scores 03/26/2021 10/22/2020 01/23/2020 12/21/2018 12/14/2017  PHQ - 2 Score 0 0 0 0 0    Fall Risk Fall Risk  03/26/2021 10/22/2020 12/21/2018 12/14/2017  Falls in the past year? 1 0 0 No  Number falls in past yr: 1 0 - -  Injury with Fall? 1 0 - -  Comment fell on ice and bruised right thigh - - -  Risk for fall due to : Impaired vision No Fall Risks - -  Follow up Falls prevention discussed Falls evaluation completed - -    FALL RISK PREVENTION PERTAINING TO THE HOME:  Any stairs  in or around the home? Yes  If so, are there any without handrails? No  Home free of loose throw rugs in walkways, pet beds, electrical cords, etc? Yes  Adequate lighting in your home to reduce risk of falls? Yes   ASSISTIVE DEVICES UTILIZED TO PREVENT FALLS:  Life alert? No  Use of a cane, walker or w/c? No  Grab bars in the bathroom? yes Shower chair or bench in shower? Yes  Elevated toilet seat or a handicapped toilet? No   TIMED UP AND GO:  Was the test performed? No .  Cognitive Function:     6CIT Screen 03/26/2021  What Year? 0 points  What month? 0 points  What time? 0 points  Count back from 20 0 points  Months in reverse 0 points  Repeat phrase 0 points  Total Score 0    Immunizations Immunization History  Administered Date(s) Administered   Fluad Quad(high Dose 65+) 12/21/2018, 12/10/2020   Influenza, High Dose Seasonal PF 12/14/2017, 01/16/2020   PFIZER(Purple Top)SARS-COV-2 Vaccination 04/18/2019, 05/09/2019, 01/16/2020, 08/13/2020   Pfizer Covid-19 Vaccine Bivalent Booster 50yrs & up 12/10/2020   Pneumococcal Conjugate-13 03/06/2017   Pneumococcal Polysaccharide-23 12/31/2015, 12/24/2018   Tdap 04/03/2015   Zoster Recombinat (Shingrix) 06/30/2020, 10/03/2020    TDAP status: Up to date  Flu Vaccine status: Up to date  Pneumococcal vaccine status: Up to date  Covid-19 vaccine status: Completed vaccines  Qualifies for Shingles Vaccine? Yes   Zostavax completed Yes   Shingrix Completed?: Yes  Screening Tests Health Maintenance  Topic Date Due   MAMMOGRAM  04/25/2022   TETANUS/TDAP  04/02/2025   COLONOSCOPY (Pts 45-108yrs Insurance coverage will need to be confirmed)  07/12/2030   Pneumonia Vaccine 79+ Years old  Completed   INFLUENZA VACCINE  Completed   DEXA SCAN  Completed   COVID-19 Vaccine  Completed   Hepatitis C Screening  Completed   Zoster Vaccines- Shingrix  Completed   HPV VACCINES  Aged Out    Health Maintenance  There are no  preventive care reminders to display for this patient.  Colorectal cancer screening: Type of screening: Colonoscopy. Completed 07/11/20. Repeat every 10 years  Mammogram status: Completed 04/25/20. Repeat every year  Bone Density status: Completed 02/25/16. Results reflect: Bone density results: NORMAL. Repeat every 2 years.    Additional Screening:  Hepatitis C Screening:  Completed 09/16/18  Vision Screening: Recommended annual ophthalmology exams for early detection of glaucoma and other disorders of the eye. Is the patient up to date with their annual eye exam?  Yes  Who is the provider or what is the name of the office in which the patient attends annual eye exams? Dr in [place] If pt is not established with a provider, would they like to be referred to a provider to establish care? No .   Dental Screening: Recommended annual dental exams for proper oral hygiene  Community Resource Referral / Chronic Care Management: CRR required this visit?  No   CCM required this visit?  No      Plan:     I have personally reviewed and noted the following in the patients chart:   Medical and social history Use of alcohol, tobacco or illicit drugs  Current medications and supplements including opioid prescriptions.  Functional ability and status Nutritional status Physical activity Advanced directives List of other physicians Hospitalizations, surgeries, and ER visits in previous 12 months Vitals Screenings to include cognitive, depression, and falls Referrals and appointments  In addition,  I have reviewed and discussed with patient certain preventive protocols, quality metrics, and best practice recommendations. A written personalized care plan for preventive services as well as general preventive health recommendations were provided to patient.     Cynthia Schlein, LPN   05/02/7679   Nurse Notes: None

## 2021-03-26 NOTE — Patient Instructions (Signed)
Cynthia Montgomery , Thank you for taking time to come for your Medicare Wellness Visit. I appreciate your ongoing commitment to your health goals. Please review the following plan we discussed and let me know if I can assist you in the future.   Screening recommendations/referrals: Colonoscopy: Done 07/11/20 repeat every 10 years  Mammogram: Done 04/25/20 repeat every year  Bone Density: Done 02/25/16 repeat every 2 years  Recommended yearly ophthalmology/optometry visit for glaucoma screening and checkup Recommended yearly dental visit for hygiene and checkup  Vaccinations: Influenza vaccine: Done 12/10/20 repeat every year  Pneumococcal vaccine: Up to date Tdap vaccine: Done 04/03/15 repeat every 10 years  Shingles vaccine: Completed 4/9 & 10/03/20   Covid-19:Completed 1/25, 2/15, 01/16/20 & 5/23, 12/10/20  Advanced directives: Advance directive discussed with you today. Even though you declined this today please call our office should you change your mind and we can give you the proper paperwork for you to fill out.  Conditions/risks identified: exercise and lose weight   Next appointment: Follow up in one year for your annual wellness visit    Preventive Care 65 Years and Older, Female Preventive care refers to lifestyle choices and visits with your health care provider that can promote health and wellness. What does preventive care include? A yearly physical exam. This is also called an annual well check. Dental exams once or twice a year. Routine eye exams. Ask your health care provider how often you should have your eyes checked. Personal lifestyle choices, including: Daily care of your teeth and gums. Regular physical activity. Eating a healthy diet. Avoiding tobacco and drug use. Limiting alcohol use. Practicing safe sex. Taking low-dose aspirin every day. Taking vitamin and mineral supplements as recommended by your health care provider. What happens during an annual well  check? The services and screenings done by your health care provider during your annual well check will depend on your age, overall health, lifestyle risk factors, and family history of disease. Counseling  Your health care provider may ask you questions about your: Alcohol use. Tobacco use. Drug use. Emotional well-being. Home and relationship well-being. Sexual activity. Eating habits. History of falls. Memory and ability to understand (cognition). Work and work Astronomer. Reproductive health. Screening  You may have the following tests or measurements: Height, weight, and BMI. Blood pressure. Lipid and cholesterol levels. These may be checked every 5 years, or more frequently if you are over 36 years old. Skin check. Lung cancer screening. You may have this screening every year starting at age 63 if you have a 30-pack-year history of smoking and currently smoke or have quit within the past 15 years. Fecal occult blood test (FOBT) of the stool. You may have this test every year starting at age 53. Flexible sigmoidoscopy or colonoscopy. You may have a sigmoidoscopy every 5 years or a colonoscopy every 10 years starting at age 24. Hepatitis C blood test. Hepatitis B blood test. Sexually transmitted disease (STD) testing. Diabetes screening. This is done by checking your blood sugar (glucose) after you have not eaten for a while (fasting). You may have this done every 1-3 years. Bone density scan. This is done to screen for osteoporosis. You may have this done starting at age 56. Mammogram. This may be done every 1-2 years. Talk to your health care provider about how often you should have regular mammograms. Talk with your health care provider about your test results, treatment options, and if necessary, the need for more tests. Vaccines  Your health  care provider may recommend certain vaccines, such as: Influenza vaccine. This is recommended every year. Tetanus, diphtheria, and  acellular pertussis (Tdap, Td) vaccine. You may need a Td booster every 10 years. Zoster vaccine. You may need this after age 81. Pneumococcal 13-valent conjugate (PCV13) vaccine. One dose is recommended after age 53. Pneumococcal polysaccharide (PPSV23) vaccine. One dose is recommended after age 65. Talk to your health care provider about which screenings and vaccines you need and how often you need them. This information is not intended to replace advice given to you by your health care provider. Make sure you discuss any questions you have with your health care provider. Document Released: 04/06/2015 Document Revised: 11/28/2015 Document Reviewed: 01/09/2015 Elsevier Interactive Patient Education  2017 [place] Prevention in the Home Falls can cause injuries. They can happen to people of all ages. There are many things you can do to make your home safe and to help prevent falls. What can I do on the outside of my home? Regularly fix the edges of walkways and driveways and fix any cracks. Remove anything that might make you trip as you walk through a door, such as a raised step or threshold. Trim any bushes or trees on the path to your home. Use bright outdoor lighting. Clear any walking paths of anything that might make someone trip, such as rocks or tools. Regularly check to see if handrails are loose or broken. Make sure that both sides of any steps have handrails. Any raised decks and porches should have guardrails on the edges. Have any leaves, snow, or ice cleared regularly. Use sand or salt on walking paths during winter. Clean up any spills in your garage right away. This includes oil or grease spills. What can I do in the bathroom? Use night lights. Install grab bars by the toilet and in the tub and shower. Do not use towel bars as grab bars. Use non-skid mats or decals in the tub or shower. If you need to sit down in the shower, use a plastic, non-slip stool. Keep  the floor dry. Clean up any water that spills on the floor as soon as it happens. Remove soap buildup in the tub or shower regularly. Attach bath mats securely with double-sided non-slip rug tape. Do not have throw rugs and other things on the floor that can make you trip. What can I do in the bedroom? Use night lights. Make sure that you have a light by your bed that is easy to reach. Do not use any sheets or blankets that are too big for your bed. They should not hang down onto the floor. Have a firm chair that has side arms. You can use this for support while you get dressed. Do not have throw rugs and other things on the floor that can make you trip. What can I do in the kitchen? Clean up any spills right away. Avoid walking on wet floors. Keep items that you use a lot in easy-to-reach places. If you need to reach something above you, use a strong step stool that has a grab bar. Keep electrical cords out of the way. Do not use floor polish or wax that makes floors slippery. If you must use wax, use non-skid floor wax. Do not have throw rugs and other things on the floor that can make you trip. What can I do with my stairs? Do not leave any items on the stairs. Make sure that there are handrails on  both sides of the stairs and use them. Fix handrails that are broken or loose. Make sure that handrails are as long as the stairways. Check any carpeting to make sure that it is firmly attached to the stairs. Fix any carpet that is loose or worn. Avoid having throw rugs at the top or bottom of the stairs. If you do have throw rugs, attach them to the floor with carpet tape. Make sure that you have a light switch at the top of the stairs and the bottom of the stairs. If you do not have them, ask someone to add them for you. What else can I do to help prevent falls? Wear shoes that: Do not have high heels. Have rubber bottoms. Are comfortable and fit you well. Are closed at the toe. Do not  wear sandals. If you use a stepladder: Make sure that it is fully opened. Do not climb a closed stepladder. Make sure that both sides of the stepladder are locked into place. Ask someone to hold it for you, if possible. Clearly mark and make sure that you can see: Any grab bars or handrails. First and last steps. Where the edge of each step is. Use tools that help you move around (mobility aids) if they are needed. These include: Canes. Walkers. Scooters. Crutches. Turn on the lights when you go into a dark area. Replace any light bulbs as soon as they burn out. Set up your furniture so you have a clear path. Avoid moving your furniture around. If any of your floors are uneven, fix them. If there are any pets around you, be aware of where they are. Review your medicines with your doctor. Some medicines can make you feel dizzy. This can increase your chance of falling. Ask your doctor what other things that you can do to help prevent falls. This information is not intended to replace advice given to you by your health care provider. Make sure you discuss any questions you have with your health care provider. Document Released: 01/04/2009 Document Revised: 08/16/2015 Document Reviewed: 04/14/2014 Elsevier Interactive Patient Education  2017 [employer].

## 2021-03-26 NOTE — Telephone Encounter (Signed)
Pt came in office stating is needing a rx since pt is leaving on a cruise next Wednesday and is wanting if possible to get something for nausea. Pt was informed could require an appt, pt is aware but wanting to send message to provider to see if possible to get rx to that, if possible send rx to  [facility] pharmacy in [place]. Please advise.

## 2021-03-27 ENCOUNTER — Telehealth: Payer: Medicare Other | Admitting: Family Medicine

## 2021-03-27 ENCOUNTER — Encounter: Payer: Self-pay | Admitting: Family Medicine

## 2021-03-27 ENCOUNTER — Telehealth (INDEPENDENT_AMBULATORY_CARE_PROVIDER_SITE_OTHER): Payer: Medicare Other | Admitting: Family Medicine

## 2021-03-27 DIAGNOSIS — Z7184 Encounter for health counseling related to travel: Secondary | ICD-10-CM

## 2021-03-27 MED ORDER — ONDANSETRON 4 MG PO TBDP: 4.0000 mg | ORAL_TABLET | Freq: Three times a day (TID) | ORAL | 0 refills | Status: DC | PRN

## 2021-03-27 MED ORDER — SCOPOLAMINE 1 MG/3DAYS TD PT72: 1.0000 | MEDICATED_PATCH | TRANSDERMAL | 0 refills | Status: DC

## 2021-03-27 NOTE — Progress Notes (Signed)
Chief Complaint  Patient presents with   Discuss medication for a Cruise    Subjective: Patient is a 71 y.o. female here for travel consultation. Due to COVID-19 pandemic, we are interacting via telephone. I verified patient's ID using 2 identifiers. Patient agreed to proceed with visit via this method. Patient is at home, I am at office. Patient and I are present for visit.   Pt is going on a cruise. She is requesting something for motion sickness. She has never been on a cruise before, but family has hx of sea sickness. She will get nausea as well.   Past Medical History:  Diagnosis Date   Abnormal myocardial perfusion study 07/22/2018   Acute pharyngitis 09/26/2017   Adiposity 09/26/2013   AKI (acute kidney injury) 01/24/2017   Arthrofibrosis of knee joint, right 10/11/2013   Asthma    Chronic back pain 08/26/2019   Edema, unspecified 09/26/2013   Essential hypertension    Dx 30 ys ago; normal BP 130/60 (with med compliance)   Exertional chest pain 07/22/2018   Facet arthropathy, lumbosacral 06/15/2015   Gastro-esophageal reflux disease without esophagitis 09/26/2013   Gonalgia 09/26/2013   Heart murmur 02/27/2016   History of cataracts    bilateral; s/p surgery   History of colon polyps    Hyperlipidemia 04/23/2020   Hypokalemia 09/26/2013   Intervertebral disc degeneration, 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   Left knee pain 04/06/2007   Left thyroid nodule 10/28/2017   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   Nonintractable episodic headache 07/27/2017   Nuclear sclerotic cataract of left eye 07/13/2014   OA (osteoarthritis) of knee    s/p left TKR 09/2010   OAB (overactive bladder) 07/19/2019   Obesity, Class II, BMI 35-39.9 08/26/2019   Positive TB test    Primary osteoarthritis of right knee 07/19/2013   Pure hypercholesterolemia 04/06/2007   Annotation: reports a history, and that she was on a medication for it   RBBB (right bundle branch block) 07/22/2018   S/P total knee  replacement, right 08/26/2016   Snoring 08/26/2019   STOP BANG 4   Syncope 01/24/2017   Transaminitis 01/24/2017   Xerostomia 10/28/2017    Objective: No conversational dyspnea Age appropriate judgment and insight Nml affect and mood  Assessment and Plan: Travel advice encounter - Plan: scopolamine (TRANSDERM-SCOP) 1 MG/3DAYS, ondansetron (ZOFRAN-ODT) 4 MG disintegrating tablet  Going on a cruise. Scope patch prn. Zofran prn.  Total time: 7 min The patient voiced understanding and agreement to the plan.  Jilda Roche [place], DO 03/27/21  11:26 AM

## 2021-03-28 ENCOUNTER — Encounter: Payer: Self-pay | Admitting: Family Medicine

## 2021-03-29 ENCOUNTER — Telehealth: Payer: Medicare Other | Admitting: Family Medicine

## 2021-04-15 ENCOUNTER — Encounter: Payer: Self-pay | Admitting: Family Medicine

## 2021-04-17 ENCOUNTER — Encounter: Payer: Self-pay | Admitting: Cardiology

## 2021-04-17 ENCOUNTER — Encounter: Payer: Self-pay | Admitting: Family Medicine

## 2021-04-21 ENCOUNTER — Emergency Department (HOSPITAL_BASED_OUTPATIENT_CLINIC_OR_DEPARTMENT_OTHER)
Admission: EM | Admit: 2021-04-21 | Discharge: 2021-04-21 | Disposition: A | Discharge status: Home / Self Care | Payer: Medicare Other | Attending: Emergency Medicine | Admitting: Emergency Medicine

## 2021-04-21 ENCOUNTER — Other Ambulatory Visit: Payer: Self-pay

## 2021-04-21 ENCOUNTER — Emergency Department (HOSPITAL_BASED_OUTPATIENT_CLINIC_OR_DEPARTMENT_OTHER): Payer: Medicare Other

## 2021-04-21 ENCOUNTER — Encounter (HOSPITAL_BASED_OUTPATIENT_CLINIC_OR_DEPARTMENT_OTHER): Payer: Self-pay | Admitting: Emergency Medicine

## 2021-04-21 DIAGNOSIS — Z79899 Other long term (current) drug therapy: Secondary | ICD-10-CM | POA: Insufficient documentation

## 2021-04-21 DIAGNOSIS — I10 Essential (primary) hypertension: Secondary | ICD-10-CM | POA: Insufficient documentation

## 2021-04-21 DIAGNOSIS — Z7982 Long term (current) use of aspirin: Secondary | ICD-10-CM | POA: Insufficient documentation

## 2021-04-21 DIAGNOSIS — M25551 Pain in right hip: Secondary | ICD-10-CM | POA: Insufficient documentation

## 2021-04-21 MED ORDER — CYCLOBENZAPRINE HCL 10 MG PO TABS
10.0000 mg | ORAL_TABLET | Freq: Once | ORAL | Status: AC
  Administered 2021-04-21: 10 mg via ORAL
  Filled 2021-04-21: qty 1

## 2021-04-21 MED ORDER — CYCLOBENZAPRINE HCL 10 MG PO TABS: 10.0000 mg | ORAL_TABLET | Freq: Two times a day (BID) | ORAL | 0 refills | Status: DC | PRN

## 2021-04-21 NOTE — ED Notes (Signed)
Ambulatory to restroom with cane without assistance

## 2021-04-21 NOTE — ED Provider Notes (Signed)
[facility] EMERGENCY DEPARTMENT Provider Note   CSN: [id number] Arrival date & time: 04/21/21  0857     History  Chief Complaint  Patient presents with   Hip Pain    Cynthia Montgomery is a 71 y.o. female.  The history is provided by the patient.  Hip Pain This is a new problem. The current episode started more than 1 week ago. The problem has not changed since onset.Pertinent negatives include no chest pain, no abdominal pain, no headaches and no shortness of breath. The symptoms are aggravated by walking. Relieved by: otc meds. Treatments tried: otc med. The treatment provided mild relief.      Home Medications Prior to Admission medications   Medication Sig Start Date End Date Taking? Authorizing Provider  cyclobenzaprine (FLEXERIL) 10 MG tablet Take 1 tablet (10 mg total) by mouth 2 (two) times daily as needed for muscle spasms. 04/21/21  Yes Lonna Rabold, DO  albuterol (VENTOLIN HFA) 108 (90 Base) MCG/ACT inhaler Inhale 2 puffs into the lungs every 6 (six) hours as needed for wheezing or shortness of breath. 02/27/20   Shelda Pal, DO  amLODipine (NORVASC) 5 MG tablet Take 1 tablet (5 mg total) by mouth daily. 11/20/20   Richardo Priest, MD  aspirin EC 81 MG tablet Take 81 mg by mouth daily.    [provider]  celecoxib (CELEBREX) 100 MG capsule Take 1 capsule (100 mg total) by mouth 2 (two) times daily. 04/23/20   Shelda Pal, DO  cholecalciferol (VITAMIN D) 400 units TABS tablet Take 400 Units by mouth daily.    [provider]  COVID-19 mRNA bivalent vaccine, Pfizer, injection Inject into the muscle. 12/10/20   Carlyle Basques, MD  fluticasone (FLONASE) 50 MCG/ACT nasal spray Place 2 sprays into both nostrils daily. Patient taking differently: Place 2 sprays into both nostrils daily as needed. 04/23/20   Shelda Pal, DO  influenza vaccine adjuvanted (FLUAD QUADRIVALENT) 0.5 ML injection Inject into the muscle.  12/10/20   Carlyle Basques, MD  losartan (COZAAR) 100 MG tablet Take 1 tablet (100 mg total) by mouth daily. 02/25/21   Shelda Pal, DO  metoprolol succinate (TOPROL-XL) 50 MG 24 hr tablet TAKE 1 TABLET BY MOUTH ONCE DAILY TAKE  WITH  OR  IMMEDIATELY  FOLLOWING  A  MEAL 11/20/20   Richardo Priest, MD  Multiple Vitamin (MULTIVITAMIN) tablet Take 1 tablet by mouth daily.    [provider]  nitroGLYCERIN (NITROSTAT) 0.4 MG SL tablet Place 1 tablet (0.4 mg total) under the tongue every 5 (five) minutes as needed for chest pain. 11/20/20 02/18/21  Richardo Priest, MD  ondansetron (ZOFRAN-ODT) 4 MG disintegrating tablet Take 1 tablet (4 mg total) by mouth every 8 (eight) hours as needed for nausea or vomiting. 03/27/21   Shelda Pal, DO  oxybutynin (DITROPAN-XL) 10 MG 24 hr tablet Take 1 tablet (10 mg total) by mouth at bedtime. 04/23/20   Shelda Pal, DO  pantoprazole (PROTONIX) 40 MG tablet Take 1 tablet (40 mg total) by mouth daily. 02/25/21   Shelda Pal, DO  rosuvastatin (CRESTOR) 10 MG tablet Take 1 tablet (10 mg total) by mouth daily. 11/20/20   Richardo Priest, MD  scopolamine (TRANSDERM-SCOP) 1 MG/3DAYS Place 1 patch (1.5 mg total) onto the skin every 3 (three) days. 03/27/21   Shelda Pal, DO      Allergies    Patient has no known allergies.  Review of Systems   Review of Systems  Respiratory:  Negative for shortness of breath.   Cardiovascular:  Negative for chest pain.  Gastrointestinal:  Negative for abdominal pain.  Neurological:  Negative for headaches.   Physical Exam Updated Vital Signs BP (!) 210/85    Pulse 77    Temp 98.5 F (36.9 C) (Oral)    Resp 20    Ht 5\' 2"  (1.575 m)    Wt 90.7 kg    SpO2 99%    BMI 36.58 kg/m  Physical Exam Constitutional:      General: She is not in acute distress.    Appearance: She is not ill-appearing.  HENT:     Head: Normocephalic and atraumatic.  Cardiovascular:     Pulses:  Normal pulses.  Pulmonary:     Effort: Pulmonary effort is normal.  Musculoskeletal:        General: Tenderness present. No swelling. Normal range of motion.     Cervical back: Normal range of motion. No tenderness.     Comments: TTP to right hip   Skin:    Capillary Refill: Capillary refill takes less than 2 seconds.     Findings: No bruising.  Neurological:     General: No focal deficit present.     Mental Status: She is alert.     Sensory: No sensory deficit.     Motor: No weakness.     Comments: 5+ out of 5 strength, normal sensation    ED Results / Procedures / Treatments   Labs (all labs ordered are listed, but only abnormal results are displayed) Labs Reviewed - No data to display  EKG None  Radiology DG Hip Unilat With Pelvis 2-3 Views Right  Result Date: 04/21/2021 CLINICAL DATA:  71 year old female with history of trauma from a fall complaining of right-sided hip pain. EXAM: DG HIP (WITH OR WITHOUT PELVIS) 2-3V RIGHT COMPARISON:  No priors. FINDINGS: AP view of the bony pelvis and AP and lateral views of the right hip demonstrate no acute displaced fracture of the bony pelvic ring. Bilateral proximal femurs as visualized appear intact, and the right femoral head is located in the acetabulum. There is joint space narrowing, subchondral sclerosis and osteophyte formation in the hip joints bilaterally, compatible with osteoarthritis. IMPRESSION: 1. No acute radiographic abnormality of the bony pelvis or the right hip. 2. Mild bilateral hip joint osteoarthritis. Electronically Signed   By: Vinnie Langton M.D.   On: 04/21/2021 09:53    Procedures Procedures    Medications Ordered in ED Medications  cyclobenzaprine (FLEXERIL) tablet 10 mg (10 mg Oral Given 04/21/21 [id number])    ED Course/ Medical Decision Making/ A&P                           Medical Decision Making Amount and/or Complexity of Data Reviewed Radiology: ordered.  Risk Prescription drug  management.  Cynthia Montgomery is here with right hip pain after a fall over a week ago.  History of hypertension, chronic back pain, chronic right knee pain.  Patient fell landed on her right hip over a week ago.  She had bruising to this area but that is improving she states.  She has been ambulating with a cane.  She has not gotten any images since the fall.  She is having pain mostly in the right hip, right gluteal area on exam.  She has no midline spinal tenderness.  May be some  paraspinal tenderness in the lumbar spine.  Differential diagnosis includes fracture versus contusion versus muscle spasm versus soft tissue strain.  We will pursue x-ray of the right hip.  She is neurovascular neuromuscularly intact.  No concern for cauda equina or other acute process.  Per my review of the x-ray there is no acute fracture.  Radiology report states the same.  Overall suspect contusion versus spasm.  We will have her follow-up with sports medicine.  Will prescribe Flexeril.  Discharged in good condition.  This chart was dictated using voice recognition software.  Despite best efforts to proofread,  errors can occur which can change the documentation meaning.          Final Clinical Impression(s) / ED Diagnoses Final diagnoses:  Right hip pain    Rx / DC Orders ED Discharge Orders          Ordered    cyclobenzaprine (FLEXERIL) 10 MG tablet  2 times daily PRN        04/21/21 0958              Lennice Sites, DO 04/21/21 [id number]

## 2021-04-21 NOTE — ED Notes (Signed)
Pt at xray, family at bedside

## 2021-04-21 NOTE — ED Triage Notes (Signed)
Pt arrives pov, ambulatory with slow gait, to triage in wheelchair, c/o Fall x 2 weeks pta. Pt c/o right side buttock and hip pain. Denies bruising or swelling

## 2021-04-24 ENCOUNTER — Ambulatory Visit: Payer: Medicare Other | Admitting: Family Medicine

## 2021-04-24 ENCOUNTER — Encounter: Payer: Self-pay | Admitting: Family Medicine

## 2021-04-24 ENCOUNTER — Ambulatory Visit: Payer: Self-pay

## 2021-04-24 ENCOUNTER — Ambulatory Visit (HOSPITAL_BASED_OUTPATIENT_CLINIC_OR_DEPARTMENT_OTHER)
Admission: RE | Admit: 2021-04-24 | Discharge: 2021-04-24 | Disposition: A | Discharge status: Home / Self Care | Payer: Medicare Other | Source: Ambulatory Visit | Attending: Family Medicine | Admitting: Family Medicine

## 2021-04-24 ENCOUNTER — Other Ambulatory Visit: Payer: Self-pay

## 2021-04-24 VITALS — BP 140/84 | Ht 62.0 in | Wt 200.0 lb

## 2021-04-24 DIAGNOSIS — S39012A Strain of muscle, fascia and tendon of lower back, initial encounter: Secondary | ICD-10-CM | POA: Insufficient documentation

## 2021-04-24 DIAGNOSIS — M533 Sacrococcygeal disorders, not elsewhere classified: Secondary | ICD-10-CM

## 2021-04-24 HISTORY — DX: Sacrococcygeal disorders, not elsewhere classified: M53.3

## 2021-04-24 HISTORY — DX: Strain of muscle, fascia and tendon of lower back, initial encounter: S39.012A

## 2021-04-24 MED ORDER — TRIAMCINOLONE ACETONIDE 40 MG/ML IJ SUSP
40.0000 mg | Freq: Once | INTRAMUSCULAR | Status: AC
  Administered 2021-04-24: 40 mg via INTRA_ARTICULAR

## 2021-04-24 NOTE — Progress Notes (Signed)
Cynthia Montgomery - 71 y.o. female MRN [id number]  Date of birth: 03/29/1950  SUBJECTIVE:  Including CC & ROS.  No chief complaint on file.   Cynthia Montgomery is a 71 y.o. female that is presenting with left gluteal and low back pain.  The pain is been ongoing since her fall.  She denies any radicular type pain.  No history of surgery..  Review of the emergency department note from 1/29 shows she was provided Flexeril. Independent review of the right hip x-ray from 1/29 shows no acute changes.  Review of Systems See HPI   HISTORY: Past Medical, Surgical, Social, and Family History Reviewed & Updated per EMR.   Pertinent Historical Findings include:  Past Medical History:  Diagnosis Date   Abnormal myocardial perfusion study 07/22/2018   Acute pharyngitis 09/26/2017   Adiposity 09/26/2013   AKI (acute kidney injury) 01/24/2017   Arthrofibrosis of knee joint, right 10/11/2013   Asthma    Chronic back pain 08/26/2019   Edema, unspecified 09/26/2013   Essential hypertension    Dx 30 ys ago; normal BP 130/60 (with med compliance)   Exertional chest pain 07/22/2018   Facet arthropathy, lumbosacral 06/15/2015   Gastro-esophageal reflux disease without esophagitis 09/26/2013   Gonalgia 09/26/2013   Heart murmur 02/27/2016   History of cataracts    bilateral; s/p surgery   History of colon polyps    Hyperlipidemia 04/23/2020   Hypokalemia 09/26/2013   Intervertebral disc degeneration, 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   Left knee pain 04/06/2007   Left thyroid nodule 10/28/2017   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   Nonintractable episodic headache 07/27/2017   Nuclear sclerotic cataract of left eye 07/13/2014   OA (osteoarthritis) of knee    s/p left TKR 09/2010   OAB (overactive bladder) 07/19/2019   Obesity, Class II, BMI 35-39.9 08/26/2019   Positive TB test    Primary osteoarthritis of right knee 07/19/2013   Pure hypercholesterolemia 04/06/2007   Annotation: reports a history, and that  she was on a medication for it   RBBB (right bundle branch block) 07/22/2018   S/P total knee replacement, right 08/26/2016   Snoring 08/26/2019   STOP BANG 4   Syncope 01/24/2017   Transaminitis 01/24/2017   Xerostomia 10/28/2017    Past Surgical History:  Procedure Laterality Date   CATARACT EXTRACTION, BILATERAL  2018   KNEE SURGERY     states 2 surgeries on each knee around 2016 and 2017   TUBAL LIGATION       PHYSICAL EXAM:  VS: BP 140/84 (BP Location: Left Arm, Patient Position: Sitting)    Ht 5\' 2"  (1.575 m)    Wt 200 lb (90.7 kg)    BMI 36.58 kg/m  Physical Exam Gen: NAD, alert, cooperative with exam, well-appearing MSK:  Neurovascularly intact     Aspiration/Injection Procedure Note Cynthia Montgomery December 07, 1950  Procedure: Injection Indications: Left SI joint pain  Procedure Details Consent: Risks of procedure as well as the alternatives and risks of each were explained to the (patient/caregiver).  Consent for procedure obtained. Time Out: Verified patient identification, verified procedure, site/side was marked, verified correct patient position, special equipment/implants available, medications/allergies/relevent history reviewed, required imaging and test results available.  Performed.  The area was cleaned with iodine and alcohol swabs.    The left SI joint was injected using 3 cc of 1% lidocaine on a 3-1/2 inch needle.  The syringe was switched to mixture containing 1 cc's of 40 mg Kenalog and 4 cc's  of 0.25% bupivacaine was injected. Ultrasound was needed for visualization of the needle and going into the SI joint on the left.  Ultrasound was used. Images were obtained in long views showing the injection.     A sterile dressing was applied.  Patient did tolerate procedure well.     ASSESSMENT & PLAN:   Strain of lumbar region Occurring after her recent fall. Has degenerative changes of the lumbar spine. -Counseled on home exercise therapy and  supportive care. -X-ray. -Could consider physical therapy.   Sacroiliac joint dysfunction of left side Acutely occurring.  Has pain over the left SI joint.  Occurred after her fall -Counseled on home exercise therapy and supportive care. -Injection today. -Could consider physical therapy.

## 2021-04-24 NOTE — Patient Instructions (Signed)
Nice to meet you Please try heat  Please try the exercises  I will call with the results from today   Please send me a message in MyChart with any questions or updates.  Please see me back in 4 weeks.   --Dr. Kynesha Guerin  

## 2021-04-24 NOTE — Assessment & Plan Note (Signed)
Acutely occurring.  Has pain over the left SI joint.  Occurred after her fall -Counseled on home exercise therapy and supportive care. -Injection today. -Could consider physical therapy.

## 2021-04-24 NOTE — Assessment & Plan Note (Signed)
Occurring after her recent fall. Has degenerative changes of the lumbar spine. -Counseled on home exercise therapy and supportive care. -X-ray. -Could consider physical therapy.

## 2021-04-29 ENCOUNTER — Encounter: Payer: Self-pay | Admitting: Family Medicine

## 2021-04-29 ENCOUNTER — Ambulatory Visit (INDEPENDENT_AMBULATORY_CARE_PROVIDER_SITE_OTHER): Payer: Medicare Other | Admitting: Family Medicine

## 2021-04-29 VITALS — BP 122/80 | HR 82 | Temp 98.3°F | Ht 62.0 in | Wt 198.5 lb

## 2021-04-29 DIAGNOSIS — N3281 Overactive bladder: Secondary | ICD-10-CM

## 2021-04-29 DIAGNOSIS — E785 Hyperlipidemia, unspecified: Secondary | ICD-10-CM | POA: Diagnosis not present

## 2021-04-29 DIAGNOSIS — Z6835 Body mass index (BMI) 35.0-35.9, adult: Secondary | ICD-10-CM

## 2021-04-29 DIAGNOSIS — H6981 Other specified disorders of Eustachian tube, right ear: Secondary | ICD-10-CM | POA: Diagnosis not present

## 2021-04-29 DIAGNOSIS — I10 Essential (primary) hypertension: Secondary | ICD-10-CM

## 2021-04-29 MED ORDER — CYCLOBENZAPRINE HCL 10 MG PO TABS: 5.0000 mg | ORAL_TABLET | Freq: Two times a day (BID) | ORAL | 0 refills | Status: DC | PRN

## 2021-04-29 MED ORDER — FLUTICASONE PROPIONATE 50 MCG/ACT NA SUSP: 2.0000 | Freq: Every day | NASAL | 6 refills | Status: DC

## 2021-04-29 NOTE — Patient Instructions (Signed)
Keep the diet clean and stay active.  Heat (pad or rice pillow in microwave) over affected area, 10-15 minutes twice daily.   Ice/cold pack over area for 10-15 min twice daily.  OK to take Tylenol 1000 mg (2 extra strength tabs) or 975 mg (3 regular strength tabs) every 6 hours as needed.  Let us know if you need anything.

## 2021-04-29 NOTE — Progress Notes (Signed)
Chief Complaint  Patient presents with   Follow-up    Subjective Cynthia Montgomery is a 71 y.o. female who presents for hypertension follow up. She does not routinely monitor home blood pressures. She is compliant with medications- Norvasc 5 mg/d, losartan 100 mg/d. Patient has these side effects of medication: none She is sometimes adhering to a healthy diet overall. Current exercise: walking, active at work No Cp or SOB.   Hyperlipidemia Patient presents for hyperlipidemia follow up. Currently being treated with Crestor 10 mg/d and compliance with treatment thus far has been good. She denies myalgias. Diet/exercise as above.  The patient is not known to have coexisting coronary artery disease.  OAB Taking Ditropan XL 10 mg/d. No AE's, reports compliance. S/s's controlled. No bleeding, d/c, fevers.    Past Medical History:  Diagnosis Date   Abnormal myocardial perfusion study 07/22/2018   Acute pharyngitis 09/26/2017   Adiposity 09/26/2013   AKI (acute kidney injury) 01/24/2017   Arthrofibrosis of knee joint, right 10/11/2013   Asthma    Chronic back pain 08/26/2019   Edema, unspecified 09/26/2013   Essential hypertension    Dx 30 ys ago; normal BP 130/60 (with med compliance)   Exertional chest pain 07/22/2018   Facet arthropathy, lumbosacral 06/15/2015   Gastro-esophageal reflux disease without esophagitis 09/26/2013   Gonalgia 09/26/2013   Heart murmur 02/27/2016   History of cataracts    bilateral; s/p surgery   History of colon polyps    Hyperlipidemia 04/23/2020   Hypokalemia 09/26/2013   Intervertebral disc degeneration, 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   Left knee pain 04/06/2007   Left thyroid nodule 10/28/2017   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   Nonintractable episodic headache 07/27/2017   Nuclear sclerotic cataract of left eye 07/13/2014   OA (osteoarthritis) of knee    s/p left TKR 09/2010   OAB (overactive bladder) 07/19/2019   Obesity, Class II, BMI 35-39.9  08/26/2019   Positive TB test    Primary osteoarthritis of right knee 07/19/2013   Pure hypercholesterolemia 04/06/2007   Annotation: reports a history, and that she was on a medication for it   RBBB (right bundle branch block) 07/22/2018   S/P total knee replacement, right 08/26/2016   Snoring 08/26/2019   STOP BANG 4   Syncope 01/24/2017   Transaminitis 01/24/2017   Xerostomia 10/28/2017    Exam BP 122/80    Pulse 82    Temp 98.3 F (36.8 C) (Oral)    Ht 5\' 2"  (1.575 m)    Wt 198 lb 8 oz (90 kg)    SpO2 93%    BMI 36.31 kg/m  General:  well developed, well nourished, in no apparent distress Heart: RRR, no bruits, no LE edema Lungs: clear to auscultation, no accessory muscle use Psych: well oriented with normal range of affect and appropriate judgment/insight  Essential hypertension  Hyperlipidemia, unspecified hyperlipidemia type  OAB (overactive bladder)  ETD (Eustachian tube dysfunction), right - Plan: fluticasone (FLONASE) 50 MCG/ACT nasal spray  Severe obesity (BMI 35.0-35.9 with comorbidity) (HCC), Chronic  Chronic, stable. Cont Norvasc 5 mg/d, losartan 100 mg/d. Counseled on diet and exercise. Chronic, stable. Cont Crestor 10 mg/d.  Chronic, stable. Cont Ditropan XL 10 mg/d.  F/u in 6 mo for CPE or prn. The patient voiced understanding and agreement to the plan.  09-08-1978 [place], DO 04/29/21  8:38 AM

## 2021-04-30 ENCOUNTER — Telehealth: Payer: Self-pay | Admitting: Family Medicine

## 2021-04-30 NOTE — Telephone Encounter (Signed)
Informed of results.   Myra Rude, MD [facility] 04/30/2021, 9:07 AM

## 2021-05-03 ENCOUNTER — Other Ambulatory Visit: Payer: Self-pay | Admitting: Family Medicine

## 2021-05-03 DIAGNOSIS — Z1231 Encounter for screening mammogram for malignant neoplasm of breast: Secondary | ICD-10-CM

## 2021-05-08 ENCOUNTER — Other Ambulatory Visit: Payer: Self-pay | Admitting: Family Medicine

## 2021-05-08 MED ORDER — PREDNISONE 5 MG PO TABS: ORAL_TABLET | ORAL | 0 refills | Status: DC

## 2021-05-20 ENCOUNTER — Ambulatory Visit
Admission: RE | Admit: 2021-05-20 | Discharge: 2021-05-20 | Disposition: A | Discharge status: Home / Self Care | Payer: Medicare Other | Source: Ambulatory Visit | Attending: Family Medicine | Admitting: Family Medicine

## 2021-05-20 ENCOUNTER — Encounter: Payer: Self-pay | Admitting: Family Medicine

## 2021-05-20 DIAGNOSIS — Z1231 Encounter for screening mammogram for malignant neoplasm of breast: Secondary | ICD-10-CM

## 2021-05-22 ENCOUNTER — Ambulatory Visit: Payer: Medicare Other | Admitting: Family Medicine

## 2021-05-22 ENCOUNTER — Encounter: Payer: Self-pay | Admitting: Family Medicine

## 2021-05-22 DIAGNOSIS — M533 Sacrococcygeal disorders, not elsewhere classified: Secondary | ICD-10-CM

## 2021-05-22 NOTE — Assessment & Plan Note (Signed)
Has gotten improvement with the injection of medication. ?-Counseled on home exercise therapy and supportive care. ?-Could consider physical therapy. ?

## 2021-05-22 NOTE — Progress Notes (Signed)
?  Cynthia Montgomery - 71 y.o. female MRN [id number]  Date of birth: [birth year] ? ?SUBJECTIVE:  Including CC & ROS.  ?No chief complaint on file. ? ? ?Cynthia Montgomery is a 71 y.o. female that is following up for her SI joint pain.  She has been doing well since the injection with medicine.  She continues to be active and doing exercises. ? ? ?Review of Systems ?See HPI  ? ?HISTORY: Past Medical, Surgical, Social, and Family History Reviewed & Updated per EMR.   ?Pertinent Historical Findings include: ? ?Past Medical History:  ?Diagnosis Date  ? Abnormal myocardial perfusion study 07/22/2018  ? Acute pharyngitis 09/26/2017  ? Adiposity 09/26/2013  ? AKI (acute kidney injury) 01/24/2017  ? Arthrofibrosis of knee joint, right 10/11/2013  ? Asthma   ? Chronic back pain 08/26/2019  ? Edema, unspecified 09/26/2013  ? Essential hypertension   ? Dx 30 ys ago; normal BP 130/60 (with med compliance)  ? Exertional chest pain 07/22/2018  ? Facet arthropathy, lumbosacral 06/15/2015  ? Gastro-esophageal reflux disease without esophagitis 09/26/2013  ? Gonalgia 09/26/2013  ? Heart murmur 02/27/2016  ? History of cataracts   ? bilateral; s/p surgery  ? History of colon polyps   ? Hyperlipidemia 04/23/2020  ? Hypokalemia 09/26/2013  ? Intervertebral disc degeneration, 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  ? Left knee pain 04/06/2007  ? Left thyroid nodule 10/28/2017  ? 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 Nonintractable episodic headache 07/27/2017  ? Nuclear sclerotic cataract of left eye 07/13/2014  ? OA (osteoarthritis) of knee   ? s/p left TKR 09/2010  ? OAB (overactive bladder) 07/19/2019  ? Obesity, Class II, BMI 35-39.9 08/26/2019  ? Positive TB test   ? Primary osteoarthritis of right knee 07/19/2013  ? Pure hypercholesterolemia 04/06/2007  ? Annotation: reports a history, and that she was on a medication for it  ? RBBB (right bundle branch block) 07/22/2018  ? S/P total knee replacement, right 08/26/2016  ? Snoring 08/26/2019  ? STOP BANG 4  ?  Syncope 01/24/2017  ? Transaminitis 01/24/2017  ? Xerostomia 10/28/2017  ? ? ?Past Surgical History:  ?Procedure Laterality Date  ? CATARACT EXTRACTION, BILATERAL  2018  ? KNEE SURGERY    ? states 2 surgeries on each knee around 2016 and 2017  ? TUBAL LIGATION    ? ? ? ?PHYSICAL EXAM:  ?VS: BP (!) 168/100 (BP Location: Left Arm, Patient Position: Sitting)   Ht 5\' 2"  (1.575 m)   Wt 198 lb (89.8 kg)   BMI 36.21 kg/m?  ?Physical Exam ?Gen: NAD, alert, cooperative with exam, well-appearing ?MSK:  ?Neurovascularly intact   ? ? ? ? ?ASSESSMENT & PLAN:  ? ?Sacroiliac joint dysfunction of left side ?Has gotten improvement with the injection of medication. ?-Counseled on home exercise therapy and supportive care. ?-Could consider physical therapy. ? ? ? ? ?

## 2021-06-01 ENCOUNTER — Other Ambulatory Visit: Payer: Self-pay | Admitting: Family Medicine

## 2021-06-03 ENCOUNTER — Ambulatory Visit (INDEPENDENT_AMBULATORY_CARE_PROVIDER_SITE_OTHER): Payer: Medicare Other | Admitting: Family Medicine

## 2021-06-03 ENCOUNTER — Telehealth: Payer: Self-pay

## 2021-06-03 ENCOUNTER — Encounter: Payer: Self-pay | Admitting: Family Medicine

## 2021-06-03 VITALS — BP 162/100 | HR 74 | Temp 98.1°F | Ht 62.0 in | Wt 193.0 lb

## 2021-06-03 DIAGNOSIS — I10 Essential (primary) hypertension: Secondary | ICD-10-CM | POA: Diagnosis not present

## 2021-06-03 MED ORDER — HYDROCHLOROTHIAZIDE 25 MG PO TABS: 25.0000 mg | ORAL_TABLET | Freq: Every day | ORAL | 3 refills | Status: DC

## 2021-06-03 NOTE — Telephone Encounter (Signed)
Nurse Assessment ?Nurse: Iona Coach, RN, Christina Date/Time (Eastern Time): 06/03/2021 11:22:49 AM ?Confirm and document reason for call. If ?symptomatic, describe symptoms. ?---Caller states her BP was 176/100 last night and has ?chest pain. Pt called the ambulance last night for this ?and pt was seen in the ED. Pt states everything was ?normal. Pt reports slight headache. Denies any other ?symptoms at this time. ?Does the patient have any new or worsening ?symptoms? ---Yes ?Will a triage be completed? ---Yes ?Related visit to physician within the last 2 weeks? ---Yes ?Does the PT have any chronic conditions? (i.e. ?diabetes, asthma, this includes High risk factors for ?pregnancy, etc.) ?---Yes ?List chronic conditions. ---hypertension, high cholesterol ?Is this a behavioral health or substance abuse call? ---No ?Guidelines ?Guideline Title Affirmed Question Affirmed Notes Nurse Date/Time (Eastern ?Time) ?Chest Pain [1] Chest pain lasts > ?5 minutes AND [2] ?occurred > 3 days ?ago (72 hours) AND ?[3] NO chest pain or ?cardiac symptoms ?now ?Iona Coach, RN, Christina 06/03/2021 11:27:03 ?AM ?PLEASE NOTE: All timestamps contained within this report are represented as [place] Standard Time. ?CONFIDENTIALTY NOTICE: This fax transmission is intended only for the addressee. It contains information that is legally privileged, confidential or ?otherwise protected from use or disclosure. If you are not the intended recipient, you are strictly prohibited from reviewing, disclosing, copying using ?or disseminating any of this information or taking any action in reliance on or regarding this information. If you have received this fax in error, please ?notify us immediately by telephone so that we can arrange for its return to [place]. Phone: [phone], Toll-Free: [phone], Fax: [phone] ?Page: 2 of 2 ?Call Id: [id number] ?Disp. Time (Eastern ?Time) Disposition Final User ?06/03/2021 11:21:25 AM Send to Urgent [facility],  Chloe-Jade ?06/03/2021 11:33:40 AM See PCP within 24 Hours Yes Iona Coach, RN, Trula Ore ?Caller Disagree/Comply Comply ?Caller Understands Yes ?PreDisposition Call Doctor ?Care Advice Given Per Guideline ?SEE PCP WITHIN 24 HOURS: * IF OFFICE WILL BE OPEN: You need to be examined within the next 24 hours. Call your doctor ?(or NP/PA) when the office opens and make an appointment. CARE ADVICE given per Chest Pain (Adult) guideline. CALL BACK ?IF: * Difficulty breathing or unusual sweating occurs * Chest pain lasts over 5 minutes * Chest pain increases in frequency, duration ?or severity * You become worse ?Referrals ?REFERRED TO PCP OFFICE ?

## 2021-06-03 NOTE — Progress Notes (Signed)
Chief Complaint  ?Patient presents with  ? Hypertension  ? ? ?Subjective ?Cynthia Montgomery is a 71 y.o. female who presents for hypertension follow up. ?She does monitor home blood pressures. ?Blood pressures ranging from 140-160's/90's on average over past 3-4 d. ?She is compliant with medications- Norvasc 5 mg/d, losartan 100 mg/d, Toprol XL 50 mg/d. ?Patient has these side effects of medication: none ?She is adhering to a healthy diet overall. ?Current exercise: walking ?No SOB. Had some CP, went to ED on 3/12 w unremarkable workup.  ?  ?Past Medical History:  ?Diagnosis Date  ? Abnormal myocardial perfusion study 07/22/2018  ? Acute pharyngitis 09/26/2017  ? Adiposity 09/26/2013  ? AKI (acute kidney injury) 01/24/2017  ? Arthrofibrosis of knee joint, right 10/11/2013  ? Asthma   ? Chronic back pain 08/26/2019  ? Edema, unspecified 09/26/2013  ? Essential hypertension   ? Dx 30 ys ago; normal BP 130/60 (with med compliance)  ? Exertional chest pain 07/22/2018  ? Facet arthropathy, lumbosacral 06/15/2015  ? Gastro-esophageal reflux disease without esophagitis 09/26/2013  ? Gonalgia 09/26/2013  ? Heart murmur 02/27/2016  ? History of cataracts   ? bilateral; s/p surgery  ? History of colon polyps   ? Hyperlipidemia 04/23/2020  ? Hypokalemia 09/26/2013  ? Intervertebral disc degeneration, 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  ? Left knee pain 04/06/2007  ? Left thyroid nodule 10/28/2017  ? 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 Nonintractable episodic headache 07/27/2017  ? Nuclear sclerotic cataract of left eye 07/13/2014  ? OA (osteoarthritis) of knee   ? s/p left TKR 09/2010  ? OAB (overactive bladder) 07/19/2019  ? Obesity, Class II, BMI 35-39.9 08/26/2019  ? Positive TB test   ? Primary osteoarthritis of right knee 07/19/2013  ? Pure hypercholesterolemia 04/06/2007  ? Annotation: reports a history, and that she was on a medication for it  ? RBBB (right bundle branch block) 07/22/2018  ? S/P total knee replacement, right 08/26/2016   ? Snoring 08/26/2019  ? STOP BANG 4  ? Syncope 01/24/2017  ? Transaminitis 01/24/2017  ? Xerostomia 10/28/2017  ? ? ?Exam ?BP (!) 162/100   Pulse 74   Temp 98.1 ?F (36.7 ?C) (Oral)   Ht 5\' 2"  (1.575 m)   Wt 193 lb (87.5 kg)   SpO2 99%   BMI 35.30 kg/m?  ?General:  well developed, well nourished, in no apparent distress ?Heart: RRR, no bruits, no LE edema ?Lungs: clear to auscultation, no accessory muscle use ?Psych: well oriented with normal range of affect and appropriate judgment/insight ? ?Essential hypertension - Plan: Basic metabolic panel, hydrochlorothiazide (HYDRODIURIL) 25 MG tablet ? ?Chronic, not controlled. Cont Norvasc 5 mg/d, Toprol XL 50 mg/d, losartan 100 mg/d. Add HCTZ 25 mg/d, ck BMP in 1 week. Monitor BP at hom. Counseled on diet and exercise. ?F/u in 2 weeks to reck. ?The patient voiced understanding and agreement to the plan. ? ? , DO ?06/03/21  ?4:03 PM ? ?

## 2021-06-03 NOTE — Telephone Encounter (Signed)
Pt had made an appt via her Mychart for high BP and chest pain.  We left a VM for her to call office back so we could have triage evaluate her since appt was not until Wednesday, 3/15.  Pt returned call to office and was transferred to Triage for further evaluation for complaints. ?

## 2021-06-03 NOTE — Telephone Encounter (Signed)
Patient it scheduled with PCP today 06/03/2021. ?

## 2021-06-03 NOTE — Patient Instructions (Signed)
Keep the diet clean and stay active.  Check your blood pressures 2-3 times per week, alternating the time of day you check it. If it is high, considering waiting 1-2 minutes and rechecking. If it gets higher, your anxiety is likely creeping up and we should avoid rechecking.   Let us know if you need anything.  

## 2021-06-05 ENCOUNTER — Ambulatory Visit: Payer: Medicare Other | Admitting: Family Medicine

## 2021-06-10 ENCOUNTER — Other Ambulatory Visit (INDEPENDENT_AMBULATORY_CARE_PROVIDER_SITE_OTHER): Payer: Medicare Other

## 2021-06-10 DIAGNOSIS — I10 Essential (primary) hypertension: Secondary | ICD-10-CM

## 2021-06-10 LAB — BASIC METABOLIC PANEL
BUN: 17 mg/dL (ref 6–23)
CO2: 28 mEq/L (ref 19–32)
Calcium: 9.4 mg/dL (ref 8.4–10.5)
Chloride: 103 mEq/L (ref 96–112)
Creatinine, Ser: 1.04 mg/dL (ref 0.40–1.20)
GFR: 54.32 mL/min — ABNORMAL LOW (ref >60.00)
Glucose, Bld: 82 mg/dL (ref 70–99)
Potassium: 4 mEq/L (ref 3.5–5.1)
Sodium: 139 mEq/L (ref 135–145)

## 2021-06-10 NOTE — Progress Notes (Signed)
?Cardiology Office Note:   ? ?Date:  06/11/2021  ? ?ID:  Cynthia Montgomery, DOB 10/05/1950, MRN [id number] ? ?PCP:  Sharlene DoryWendling, Nicholas Paul, DO  ?Cardiologist:  Norman HerrlichBrian Matilde Markie, MD   ? ?Referring MD: Sharlene DoryWendling, Nicholas Paul*  ? ? ?ASSESSMENT:   ? ?1. Mild CAD   ?2. RBBB   ?3. Essential hypertension   ? ?PLAN:   ? ?In order of problems listed above: ? ?Stable her chest pain was nonanginal it was in the context of uncontrolled hypertension I will continue current medical therapy including aspirin beta-blocker and high intensity statin I do not think she requires repeat cardiac CTA ?Stable EKG pattern ?Remains elevated at home we will transition from low potency losartan to high potency telmisartan to achieve goal ?Continue her statin ? ? ?Next appointment: 6 months ? ? ?Medication Adjustments/Labs and Tests Ordered: ?Current medicines are reviewed at length with the patient today.  Concerns regarding medicines are outlined above.  ?No orders of the defined types were placed in this encounter. ? ?No orders of the defined types were placed in this encounter. ? ? ?Chief Complaint  ?Patient presents with  ? Follow-up  ? Chest Pain  ?After ED visit ? ?History of Present Illness:   ? ?Cynthia Montgomery is a 71 y.o. female with a hx of  mild CAD hypertension hyperlipidemia and right bundle branch block.  Cardiac CTA performed 09/21/2018 showed a calcium score of 50 /67th percentile and mild nonobstructive CAD less than 25% in the proximal left anterior descending coronary artery.  She was last seen 11/20/2020. ? ?Compliance with diet, lifestyle and medications: yes ? ?[facility] pain.  Her EKG showed first-degree AV block left axis deviation left anterior hemiblock incomplete right bundle branch block and no acute ischemic changes.  Her initial and delta troponin were both low she was felt not to have acute coronary syndrome.  She was also noted to have be  hypertensive with a blood pressure 190/86.  Other labs included a normal CBC hemoglobin 12.7 platelets 235,000 sodium 139 potassium 3.9 creatinine 1.07 GFR 56 cc chest x-ray showed scarring or atelectasis left midlung field. ? 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 ?She does not have typical exertional angina ?When she went to the emergency room she had a headache her blood pressure was elevated felt badly and was having nonanginal chest pain best described as twinges in the chest ?BP meds were adjusted and she has had no recurrence. ?Checking blood pressure at home she has had systolics in the 150s since the ?She continues on calcium channel blocker ? ?She was placed on thiazide diuretic, we will transition her from losartan week potency to telmisartan high potency to achieve blood pressure goal and continue to monitor blood pressure at home ? ?I do not think she needs a repeat cardiac CTA ?Past Medical History:  ?Diagnosis Date  ? Abnormal myocardial perfusion study 07/22/2018  ? Acute pharyngitis 09/26/2017  ? Adiposity 09/26/2013  ? AKI (acute kidney injury) 01/24/2017  ? Arthrofibrosis of knee joint, right 10/11/2013  ? Asthma   ? Chronic back pain 08/26/2019  ? Edema, unspecified 09/26/2013  ? Essential hypertension   ? Dx 30 ys ago; normal BP 130/60 (with med compliance)  ? Exertional chest pain 07/22/2018  ? Facet arthropathy, lumbosacral 06/15/2015  ? Gastro-esophageal reflux disease without esophagitis 09/26/2013  ? Gonalgia 09/26/2013  ?  Heart murmur 02/27/2016  ? History of cataracts   ? bilateral; s/p surgery  ? History of colon polyps   ? Hyperlipidemia 04/23/2020  ? Hypokalemia 09/26/2013  ? Intervertebral disc degeneration, 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  ? Left knee pain 04/06/2007  ? Left thyroid nodule 10/28/2017  ? 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 Nonintractable episodic headache 07/27/2017  ? Nuclear sclerotic cataract of left eye 07/13/2014  ? OA (osteoarthritis) of knee   ?  s/p left TKR 09/2010  ? OAB (overactive bladder) 07/19/2019  ? Obesity, Class II, BMI 35-39.9 08/26/2019  ? Positive TB test   ? Primary osteoarthritis of right knee 07/19/2013  ? Pure hypercholesterolemia 04/06/2007  ? Annotation: reports a history, and that she was on a medication for it  ? RBBB (right bundle branch block) 07/22/2018  ? S/P total knee replacement, right 08/26/2016  ? Snoring 08/26/2019  ? STOP BANG 4  ? Syncope 01/24/2017  ? Transaminitis 01/24/2017  ? Xerostomia 10/28/2017  ? ? ?Past Surgical History:  ?Procedure Laterality Date  ? CATARACT EXTRACTION, BILATERAL  2018  ? KNEE SURGERY    ? states 2 surgeries on each knee around 2016 and 2017  ? TUBAL LIGATION    ? ? ?Current Medications: ?Current Meds  ?Medication Sig  ? albuterol (VENTOLIN HFA) 108 (90 Base) MCG/ACT inhaler Inhale 2 puffs into the lungs every 6 (six) hours as needed for wheezing or shortness of breath.  ? amLODipine (NORVASC) 5 MG tablet Take 1 tablet (5 mg total) by mouth daily.  ? aspirin EC 81 MG tablet Take 81 mg by mouth daily.  ? cholecalciferol (VITAMIN D) 400 units TABS tablet Take 400 Units by mouth daily.  ? fluticasone (FLONASE) 50 MCG/ACT nasal spray Place 2 sprays into both nostrils daily.  ? hydrochlorothiazide (HYDRODIURIL) 25 MG tablet Take 1 tablet (25 mg total) by mouth daily.  ? losartan (COZAAR) 100 MG tablet Take 1 tablet (100 mg total) by mouth daily.  ? metoprolol succinate (TOPROL-XL) 50 MG 24 hr tablet TAKE 1 TABLET BY MOUTH ONCE DAILY TAKE  WITH  OR  IMMEDIATELY  FOLLOWING  A  MEAL  ? Multiple Vitamin (MULTIVITAMIN) tablet Take 1 tablet by mouth daily.  ? oxybutynin (DITROPAN-XL) 10 MG 24 hr tablet TAKE 1 TABLET BY MOUTH AT BEDTIME  ? pantoprazole (PROTONIX) 40 MG tablet Take 1 tablet (40 mg total) by mouth daily.  ? rosuvastatin (CRESTOR) 10 MG tablet Take 1 tablet (10 mg total) by mouth daily.  ?  ? ?Allergies:   Patient has no known allergies.  ? ?Social History  ? ?Socioeconomic History  ? Marital  status: Married  ?  Spouse name: Not on file  ? Number of children: Not on file  ? Years of education: 41  ? Highest education level: High school graduate  ?Occupational History  ? Occupation: Retired  ?Tobacco Use  ? Smoking status: Former  ?  Types: Cigarettes  ? Smokeless tobacco: Never  ? Tobacco comments:  ?  quit 30 years ago, 1980  ?Vaping Use  ? Vaping Use: Never used  ?Substance and Sexual Activity  ? Alcohol use: No  ? Drug use: No  ? Sexual activity: Yes  ?  Birth control/protection: None  ?Other Topics Concern  ? Not on file  ?Social History Narrative  ? Not on file  ? ?Social Determinants of Health  ? ?Financial Resource Strain: Low Risk   ? Difficulty of Paying Living Expenses: Not hard at all  ?  Food Insecurity: No Food Insecurity  ? Worried About Programme researcher, broadcasting/film/video in the Last Year: Never true  ? Ran Out of Food in the Last Year: Never true  ?Transportation Needs: No Transportation Needs  ? Lack of Transportation (Medical): No  ? Lack of Transportation (Non-Medical): No  ?Physical Activity: Inactive  ? Days of Exercise per Week: 0 days  ? Minutes of Exercise per Session: 0 min  ?Stress: No Stress Concern Present  ? Feeling of Stress : Not at all  ?Social Connections: Moderately Isolated  ? Frequency of Communication with Friends and Family: More than three times a week  ? Frequency of Social Gatherings with Friends and Family: More than three times a week  ? Attends Religious Services: More than 4 times per year  ? Active Member of Clubs or Organizations: No  ? Attends Banker Meetings: Never  ? Marital Status: Separated  ?  ? ?Family History: ?The patient's family history includes Arthritis in her daughter and mother; Asthma in her daughter; Cancer in her father; Diabetes in her sister; Hypertension in her mother and sister; Kidney disease in her daughter; Memory loss in her maternal aunt, mother, and sister; Pancreatic cancer in her father. There is no history of Colon cancer, Colon  polyps, Esophageal cancer, Stomach cancer, or Rectal cancer. ?ROS:   ?Please see the history of present illness.    ?All other systems reviewed and are negative. ? ?EKGs/Labs/Other Studies Reviewed:   ? ?The foll

## 2021-06-11 ENCOUNTER — Other Ambulatory Visit: Payer: Self-pay

## 2021-06-11 ENCOUNTER — Ambulatory Visit: Payer: Medicare Other | Admitting: Cardiology

## 2021-06-11 ENCOUNTER — Encounter: Payer: Self-pay | Admitting: Cardiology

## 2021-06-11 VITALS — BP 138/82 | HR 67 | Ht 62.0 in | Wt 196.0 lb

## 2021-06-11 DIAGNOSIS — I4510 Unspecified right bundle-branch block: Secondary | ICD-10-CM

## 2021-06-11 DIAGNOSIS — I2510 Atherosclerotic heart disease of native coronary artery without angina pectoris: Secondary | ICD-10-CM | POA: Diagnosis not present

## 2021-06-11 DIAGNOSIS — I10 Essential (primary) hypertension: Secondary | ICD-10-CM | POA: Diagnosis not present

## 2021-06-11 MED ORDER — TELMISARTAN 40 MG PO TABS
40.0000 mg | ORAL_TABLET | Freq: Every day | ORAL | 5 refills | Status: DC
Start: 2021-06-11 — End: 2022-01-10

## 2021-06-11 NOTE — Patient Instructions (Signed)
Medication Instructions:  ?Your physician has recommended you make the following change in your medication:  ?Discontinue Losartan ?Start Telmisartan 40 mg once daily ? ?*If you need a refill on your cardiac medications before your next appointment, please call your pharmacy* ? ? ?Lab Work: ?NONE ?If you have labs (blood work) drawn today and your tests are completely normal, you will receive your results only by: ?MyChart Message (if you have MyChart) OR ?A paper copy in the mail ?If you have any lab test that is abnormal or we need to change your treatment, we will call you to review the results. ? ? ?Testing/Procedures: ?NONE ? ? ?Follow-Up: ?At [facility], you and your health needs are our priority.  As part of our continuing mission to provide you with exceptional heart care, we have created designated Provider Care Teams.  These Care Teams include your primary Cardiologist (physician) and Advanced Practice Providers (APPs -  Physician Assistants and Nurse Practitioners) who all work together to provide you with the care you need, when you need it. ? ?We recommend signing up for the patient portal called "MyChart".  Sign up information is provided on this After Visit Summary.  MyChart is used to connect with patients for Virtual Visits (Telemedicine).  Patients are able to view lab/test results, encounter notes, upcoming appointments, etc.  Non-urgent messages can be sent to your provider as well.   ?To learn more about what you can do with MyChart, go to [email or web address].   ? ?Your next appointment:   ?1 year(s) ? ?The format for your next appointment:   ?In Person ? ?Provider:   ?Shirlee More, MD  ? ? ?Other Instructions ?  ?

## 2021-06-17 ENCOUNTER — Encounter: Payer: Self-pay | Admitting: Family Medicine

## 2021-06-17 ENCOUNTER — Ambulatory Visit (INDEPENDENT_AMBULATORY_CARE_PROVIDER_SITE_OTHER): Payer: Medicare Other | Admitting: Family Medicine

## 2021-06-17 VITALS — BP 130/76 | HR 73 | Temp 98.2°F | Resp 14 | Ht 62.0 in | Wt 196.6 lb

## 2021-06-17 DIAGNOSIS — I10 Essential (primary) hypertension: Secondary | ICD-10-CM

## 2021-06-17 NOTE — Patient Instructions (Signed)
Keep the diet clean and stay active.  Because your blood pressure is well-controlled, you no longer have to check your blood pressure at home anymore unless you wish. Some people check it twice daily every day and some people stop altogether. Either or anything in between is fine. Strong work!  Let us know if you need anything. 

## 2021-06-17 NOTE — Progress Notes (Signed)
Chief Complaint  ?Patient presents with  ? 2 Week Follow-up  ? ? ?Subjective ?Cynthia Montgomery is a 71 y.o. female who presents for hypertension follow up. ?She does monitor home blood pressures. ?Blood pressures ranging from 130-140's/80's on average. ?She is compliant with medications- Micardis 40 mg/d, Toprol Xl 50 mg/d, HCTZ 25 mg/d, Norvasc 5 mg/d. ?Patient has these side effects of medication: none ?She is usually adhering to a healthy diet overall. ?Current exercise: walking ?No Cp or SOB.  ?  ?Past Medical History:  ?Diagnosis Date  ? Abnormal myocardial perfusion study 07/22/2018  ? Acute pharyngitis 09/26/2017  ? Adiposity 09/26/2013  ? AKI (acute kidney injury) 01/24/2017  ? Arthrofibrosis of knee joint, right 10/11/2013  ? Asthma   ? Chronic back pain 08/26/2019  ? Edema, unspecified 09/26/2013  ? Essential hypertension   ? Dx 30 ys ago; normal BP 130/60 (with med compliance)  ? Exertional chest pain 07/22/2018  ? Facet arthropathy, lumbosacral 06/15/2015  ? Gastro-esophageal reflux disease without esophagitis 09/26/2013  ? Gonalgia 09/26/2013  ? Heart murmur 02/27/2016  ? History of cataracts   ? bilateral; s/p surgery  ? History of colon polyps   ? Hyperlipidemia 04/23/2020  ? Hypokalemia 09/26/2013  ? Intervertebral disc degeneration, 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  ? Left knee pain 04/06/2007  ? Left thyroid nodule 10/28/2017  ? 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 Nonintractable episodic headache 07/27/2017  ? Nuclear sclerotic cataract of left eye 07/13/2014  ? OA (osteoarthritis) of knee   ? s/p left TKR 09/2010  ? OAB (overactive bladder) 07/19/2019  ? Obesity, Class II, BMI 35-39.9 08/26/2019  ? Positive TB test   ? Primary osteoarthritis of right knee 07/19/2013  ? Pure hypercholesterolemia 04/06/2007  ? Annotation: reports a history, and that she was on a medication for it  ? RBBB (right bundle branch block) 07/22/2018  ? S/P total knee replacement, right 08/26/2016  ? Snoring 08/26/2019  ? STOP BANG 4  ?  Syncope 01/24/2017  ? Transaminitis 01/24/2017  ? Xerostomia 10/28/2017  ? ? ?Exam ?BP 130/76   Pulse 73   Temp 98.2 ?F (36.8 ?C)   Resp 14   Ht 5\' 2"  (1.575 m)   Wt 196 lb 9.6 oz (89.2 kg)   BMI 35.96 kg/m?  ?General:  well developed, well nourished, in no apparent distress ?Heart: RRR, no bruits, no LE edema ?Lungs: clear to auscultation, no accessory muscle use ?Psych: well oriented with normal range of affect and appropriate judgment/insight ? ?Essential hypertension ? ?Chronic, stable. Cont telmisartan 40 mg/d, HCTZ 25 mg/d, Norvasc 5 mg/d, Toprol XL 50 mg/d. Counseled on diet and exercise. ?F/u in 6 mo for CPE or prn. ?The patient voiced understanding and agreement to the plan. ? ? , DO ?06/17/21  ?10:32 AM ? ? ?

## 2021-06-29 ENCOUNTER — Other Ambulatory Visit: Payer: Self-pay | Admitting: Family Medicine

## 2021-08-21 ENCOUNTER — Encounter: Payer: Self-pay | Admitting: Cardiology

## 2021-08-31 ENCOUNTER — Other Ambulatory Visit: Payer: Self-pay | Admitting: Family Medicine

## 2021-09-12 ENCOUNTER — Telehealth: Payer: Self-pay | Admitting: Cardiology

## 2021-09-12 DIAGNOSIS — R0602 Shortness of breath: Secondary | ICD-10-CM | POA: Diagnosis not present

## 2021-09-12 DIAGNOSIS — R001 Bradycardia, unspecified: Secondary | ICD-10-CM | POA: Diagnosis not present

## 2021-09-12 DIAGNOSIS — Z87891 Personal history of nicotine dependence: Secondary | ICD-10-CM | POA: Diagnosis not present

## 2021-09-12 DIAGNOSIS — M549 Dorsalgia, unspecified: Secondary | ICD-10-CM | POA: Diagnosis not present

## 2021-09-12 DIAGNOSIS — E876 Hypokalemia: Secondary | ICD-10-CM | POA: Diagnosis not present

## 2021-09-12 DIAGNOSIS — J45909 Unspecified asthma, uncomplicated: Secondary | ICD-10-CM | POA: Diagnosis not present

## 2021-09-12 DIAGNOSIS — I440 Atrioventricular block, first degree: Secondary | ICD-10-CM | POA: Diagnosis not present

## 2021-09-12 DIAGNOSIS — G8929 Other chronic pain: Secondary | ICD-10-CM | POA: Diagnosis not present

## 2021-09-12 DIAGNOSIS — E785 Hyperlipidemia, unspecified: Secondary | ICD-10-CM | POA: Diagnosis not present

## 2021-09-12 DIAGNOSIS — R7303 Prediabetes: Secondary | ICD-10-CM | POA: Diagnosis not present

## 2021-09-12 DIAGNOSIS — I4510 Unspecified right bundle-branch block: Secondary | ICD-10-CM | POA: Diagnosis not present

## 2021-09-12 DIAGNOSIS — K219 Gastro-esophageal reflux disease without esophagitis: Secondary | ICD-10-CM | POA: Diagnosis not present

## 2021-09-12 DIAGNOSIS — I119 Hypertensive heart disease without heart failure: Secondary | ICD-10-CM | POA: Diagnosis not present

## 2021-09-12 DIAGNOSIS — I2510 Atherosclerotic heart disease of native coronary artery without angina pectoris: Secondary | ICD-10-CM | POA: Diagnosis not present

## 2021-09-12 NOTE — Telephone Encounter (Signed)
Patient called and stated that she had been having chest pain for a year off and on, and now it seems to be happening more frequently. She reports that she has pain in her left upper chest and she is also short of breath. She has been having to take Nitro to deal with the chest pain. The chest pain was "very severe" yesterday and she had to take two nitro to get relief. Today she had the chest pain again and had to take 1 nitro. She states that she has a big family history of relatives having heart attacks and dying. Based on her symptoms and family history I recommended that she go to the ER to be evaluated. Patient agreed with this plan and stated that she would go.

## 2021-09-12 NOTE — Telephone Encounter (Signed)
Pt c/o of Chest Pain: STAT if CP now or developed within 24 hours  1. Are you having CP right now? No   2. Are you experiencing any other symptoms (ex. SOB, nausea, vomiting, sweating)? SOB   3. How long have you been experiencing CP? She has been having this for over a year, but it's happening more frequently now.   4. Is your CP continuous or coming and going? Comes and goes the pain.   5. Have you taken Nitroglycerin? Yes.  ?

## 2021-09-13 DIAGNOSIS — I2510 Atherosclerotic heart disease of native coronary artery without angina pectoris: Secondary | ICD-10-CM | POA: Diagnosis not present

## 2021-09-13 DIAGNOSIS — I10 Essential (primary) hypertension: Secondary | ICD-10-CM | POA: Diagnosis not present

## 2021-09-13 DIAGNOSIS — R7303 Prediabetes: Secondary | ICD-10-CM | POA: Diagnosis not present

## 2021-09-13 DIAGNOSIS — E876 Hypokalemia: Secondary | ICD-10-CM | POA: Diagnosis not present

## 2021-09-13 DIAGNOSIS — R0602 Shortness of breath: Secondary | ICD-10-CM | POA: Diagnosis not present

## 2021-09-13 DIAGNOSIS — J984 Other disorders of lung: Secondary | ICD-10-CM | POA: Diagnosis not present

## 2021-09-13 DIAGNOSIS — R072 Precordial pain: Secondary | ICD-10-CM | POA: Diagnosis not present

## 2021-09-13 DIAGNOSIS — I440 Atrioventricular block, first degree: Secondary | ICD-10-CM | POA: Diagnosis not present

## 2021-09-13 DIAGNOSIS — E785 Hyperlipidemia, unspecified: Secondary | ICD-10-CM | POA: Diagnosis not present

## 2021-09-13 DIAGNOSIS — I4510 Unspecified right bundle-branch block: Secondary | ICD-10-CM | POA: Diagnosis not present

## 2021-09-14 DIAGNOSIS — I440 Atrioventricular block, first degree: Secondary | ICD-10-CM | POA: Diagnosis not present

## 2021-09-14 DIAGNOSIS — I4519 Other right bundle-branch block: Secondary | ICD-10-CM | POA: Diagnosis not present

## 2021-09-18 ENCOUNTER — Telehealth: Payer: Self-pay | Admitting: Family

## 2021-09-18 NOTE — Telephone Encounter (Signed)
Patient is having discomfort under her shoulder blades. Pain has been going on for a while. Patient has questions about her heart catherization tomorrow. Please call to discuss

## 2021-09-18 NOTE — Telephone Encounter (Signed)
Routed to c. Walker, never seen by this provider, [facility] pt.

## 2021-09-18 NOTE — Telephone Encounter (Signed)
Called patient to get more information. Patient states that she is not having any chest pain or discomfort since she was at [facility] last Thursday (6/22) and Friday (6/23). Patient also states that she is not scheduled to have a heart catheterization tomorrow. Patient states that she called the call center to schedule a follow up appointment with Dr. Dulce Sellar. A follow up appointment was scheduled for this patient on 10/11/21 at 12:40 pm. Patient has no further questions at this time.

## 2021-09-23 ENCOUNTER — Other Ambulatory Visit: Payer: Self-pay | Admitting: Cardiology

## 2021-09-23 ENCOUNTER — Other Ambulatory Visit: Payer: Self-pay | Admitting: Family Medicine

## 2021-09-23 DIAGNOSIS — I10 Essential (primary) hypertension: Secondary | ICD-10-CM

## 2021-09-27 ENCOUNTER — Encounter: Payer: Self-pay | Admitting: Family Medicine

## 2021-09-27 ENCOUNTER — Ambulatory Visit (INDEPENDENT_AMBULATORY_CARE_PROVIDER_SITE_OTHER): Payer: Medicare Other | Admitting: Family Medicine

## 2021-09-27 VITALS — BP 120/80 | HR 84 | Temp 98.0°F | Ht 62.0 in | Wt 201.5 lb

## 2021-09-27 DIAGNOSIS — F411 Generalized anxiety disorder: Secondary | ICD-10-CM | POA: Diagnosis not present

## 2021-09-27 DIAGNOSIS — Z09 Encounter for follow-up examination after completed treatment for conditions other than malignant neoplasm: Secondary | ICD-10-CM

## 2021-09-27 DIAGNOSIS — E876 Hypokalemia: Secondary | ICD-10-CM

## 2021-09-27 LAB — BASIC METABOLIC PANEL
BUN: 18 mg/dL (ref 6–23)
CO2: 26 mEq/L (ref 19–32)
Calcium: 9.7 mg/dL (ref 8.4–10.5)
Chloride: 104 mEq/L (ref 96–112)
Creatinine, Ser: 1.09 mg/dL (ref 0.40–1.20)
GFR: 51.23 mL/min — ABNORMAL LOW (ref >60.00)
Glucose, Bld: 85 mg/dL (ref 70–99)
Potassium: 3.8 mEq/L (ref 3.5–5.1)
Sodium: 140 mEq/L (ref 135–145)

## 2021-09-27 MED ORDER — HYDROXYZINE HCL 25 MG PO TABS: 12.5000 mg | ORAL_TABLET | Freq: Three times a day (TID) | ORAL | 2 refills | Status: DC | PRN

## 2021-09-27 MED ORDER — CITALOPRAM HYDROBROMIDE 20 MG PO TABS: 20.0000 mg | ORAL_TABLET | Freq: Every day | ORAL | 3 refills | Status: DC

## 2021-09-27 NOTE — Telephone Encounter (Signed)
Rx refill sent to pharmacy. 

## 2021-09-27 NOTE — Progress Notes (Signed)
Chief Complaint  Patient presents with   Hospitalization Follow-up    Feet swelling     Subjective: Patient is a 71 y.o. female here for hosp f/u.  No CP since admission. CP r/o neg, troponins remained flat. Has appt w her cardiologist in 2 weeks. EKG reportedly unremarkable upon reviewing records. Hypokalemia initially she does take take an OTC supp.   She notes she has high anxiety. Took her daughter's medication (Atarax) which did help with her anxiety. This has been going on for several years. She does not follow w a counselor/therapist. No HI or SI. No self medication.   Past Medical History:  Diagnosis Date   Abnormal myocardial perfusion study 07/22/2018   Acute pharyngitis 09/26/2017   Adiposity 09/26/2013   AKI (acute kidney injury) 01/24/2017   Arthrofibrosis of knee joint, right 10/11/2013   Asthma    Chronic back pain 08/26/2019   Edema, unspecified 09/26/2013   Essential hypertension    Dx 30 ys ago; normal BP 130/60 (with med compliance)   Exertional chest pain 07/22/2018   Facet arthropathy, lumbosacral 06/15/2015   Gastro-esophageal reflux disease without esophagitis 09/26/2013   Gonalgia 09/26/2013   Heart murmur 02/27/2016   History of cataracts    bilateral; s/p surgery   History of colon polyps    Hyperlipidemia 04/23/2020   Hypokalemia 09/26/2013   Intervertebral disc degeneration, 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   Left knee pain 04/06/2007   Left thyroid nodule 10/28/2017   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   Nonintractable episodic headache 07/27/2017   Nuclear sclerotic cataract of left eye 07/13/2014   OA (osteoarthritis) of knee    s/p left TKR 09/2010   OAB (overactive bladder) 07/19/2019   Obesity, Class II, BMI 35-39.9 08/26/2019   Positive TB test    Primary osteoarthritis of right knee 07/19/2013   Pure hypercholesterolemia 04/06/2007   Annotation: reports a history, and that she was on a medication for it   RBBB (right bundle branch block) 07/22/2018    S/P total knee replacement, right 08/26/2016   Snoring 08/26/2019   STOP BANG 4   Syncope 01/24/2017   Transaminitis 01/24/2017   Xerostomia 10/28/2017    Objective: BP 120/80   Pulse 84   Temp 98 F (36.7 C) (Oral)   Ht 5\' 2"  (1.575 m)   Wt 201 lb 8 oz (91.4 kg)   SpO2 98%   BMI 36.85 kg/m  General: Awake, appears stated age Heart: RRR, trace LE edema around L ankle Lungs: CTAB, no rales, wheezes or rhonchi. No accessory muscle use Psych: Age appropriate judgment and insight, normal affect and mood  Assessment and Plan: GAD (generalized anxiety disorder) - Plan: citalopram (CELEXA) 20 MG tablet, hydrOXYzine (ATARAX) 25 MG tablet  Hypokalemia - Plan: Basic metabolic panel  Hospital discharge follow-up  Chronic, uncontrolled. Start Celexa 10 mg/d for 2 weeks and then increase to 20 mg/d. Atarax 12.5-75 mg TID prn. She had done well taking her daughter's Atarax. Counseling info provided. Anxiety coping techniques provided. Counseled on exercise. Reck BMP.  Has f/u w cards. No CP since then, no need to expedite this based on no CP since hospitalization.  The patient voiced understanding and agreement to the plan.  07-24-1978 [place], DO 09/27/21  11:47 AM

## 2021-09-27 NOTE — Patient Instructions (Addendum)
For the Celexa/citalopram, take 1/2 tab daily for 2 weeks and then take a full tab.  Keep the diet clean and stay active.  Give [place] 2-3 business days to get the results of your labs back.   Please consider counseling. Contact [phone] to schedule an appointment or inquire about cost/insurance coverage.  [facility] located at [address], [address], [place], [place].  Phone number = [phone].  Dr. Regan Lemming - Adult Psychiatry.    [facility] located at [address] [place], [place], [place]. Phone number = [phone].   [facility] located at [address], [place], [place].  Phone number = [phone].   [facility] located at [address] [place], [place], [place].  Phone number = [phone].  Coping skills Choose 5 that work for you: Take a deep breath Count to 20 Read a book Do a puzzle Meditate Bake Sing Knit Garden Pray Go outside Call a friend Listen to music Take a walk Color Send a note Take a bath Watch a movie Be alone in a quiet place Pet an animal Visit a friend Journal Exercise Stretch   Let us know if you need anything.

## 2021-10-03 DIAGNOSIS — H109 Unspecified conjunctivitis: Secondary | ICD-10-CM | POA: Diagnosis not present

## 2021-10-06 ENCOUNTER — Other Ambulatory Visit: Payer: Self-pay | Admitting: Family Medicine

## 2021-10-06 DIAGNOSIS — I10 Essential (primary) hypertension: Secondary | ICD-10-CM

## 2021-10-11 ENCOUNTER — Ambulatory Visit: Payer: Medicare Other | Admitting: Cardiology

## 2021-10-11 ENCOUNTER — Encounter: Payer: Self-pay | Admitting: Cardiology

## 2021-10-11 VITALS — BP 120/82 | HR 60 | Ht 62.0 in | Wt 197.0 lb

## 2021-10-11 DIAGNOSIS — I10 Essential (primary) hypertension: Secondary | ICD-10-CM

## 2021-10-11 DIAGNOSIS — I2510 Atherosclerotic heart disease of native coronary artery without angina pectoris: Secondary | ICD-10-CM | POA: Diagnosis not present

## 2021-10-11 DIAGNOSIS — I4510 Unspecified right bundle-branch block: Secondary | ICD-10-CM | POA: Diagnosis not present

## 2021-10-11 DIAGNOSIS — E782 Mixed hyperlipidemia: Secondary | ICD-10-CM | POA: Diagnosis not present

## 2021-10-11 NOTE — Progress Notes (Signed)
Cardiology Office Note:    Date:  10/11/2021   ID:  Cynthia Montgomery, DOB Nov 07, 1950, MRN [id number]  PCP:  Sharlene Dory, DO  Cardiologist:  Norman Herrlich, MD    Referring MD: Sharlene Dory*    ASSESSMENT:    1. Mild CAD   2. RBBB   3. Essential hypertension   4. Mixed hyperlipidemia    PLAN:    In order of problems listed above:  She has mild nonobstructive CAD in retrospect her symptoms were not anginal I think it is obvious anxiety played a major spine addressed she is improved and will continue her current medical therapy including aspirin lipid-lowering with high intensity statin and her beta-blocker.  At this time I do not think she needs to repeat coronary angiography EKG pattern stable in the emergency room Well-controlled continue her current treatment including calcium channel blocker ARB Continue her statin Labs are followed with Dr. Carmelia Roller   Next appointment: 9 months   Medication Adjustments/Labs and Tests Ordered: Current medicines are reviewed at length with the patient today.  Concerns regarding medicines are outlined above.  No orders of the defined types were placed in this encounter.  No orders of the defined types were placed in this encounter.   Chief Complaint  Patient presents with   Follow-up  After recent ED visit  History of Present Illness:    Cynthia Montgomery is a 71 y.o. female with a hx of mild CAD hypertension hyperlipidemia and right bundle branch block last seen 06/11/2021. Cardiac CTA performed 09/21/2018 showed a calcium score of 50 /67th percentile and mild nonobstructive CAD less than 25% in the proximal left anterior descending coronary artery .  She was seen [place] [facility] ED 09/12/2021 after needing to take nitroglycerin for chest pain.  Her EKG showed the same pattern bundle branch block first-degree AV block and described as showing no acute ischemic changes initial repeat troponins  were not elevated.  Hemoglobin 12.2 CMP was normal potassium 3.7 chest x-ray was described as normal.  She is felt not to have acute coronary syndrome and discharged from the hospital for outpatient follow-up.  She was seen in the ED [facility] 06/02/2021 for chest pain.  Her EKG showed first-degree AV block left axis deviation left anterior hemiblock incomplete right bundle branch block and no acute ischemic changes.  Her initial and delta troponin were both low she was felt not to have acute coronary syndrome.  She was also noted to have be hypertensive with a blood pressure 190/86.  Other labs included a normal CBC hemoglobin 12.7 platelets 235,000 sodium 139 potassium 3.9 creatinine 1.07 GFR 56 cc chest x-ray showed scarring or atelectasis left midlung field.   IMPRESSION:  1. No evidence of inducible myocardial ischemia or fixed perfusion defects.  2. Normal left ventricular wall motion.  3. Left ventricular ejection fraction 69%  4. Non invasive risk stratification*: Low   Compliance with diet, lifestyle and medications: Yes  She is feeling much better in retrospect she thinks that anxiety played into the episode and with treatment including Celexa she has had no further episodes of anxiety or chest discomfort The chest pain was not anginal in nature she said it was fleeting twinges and no indication of acute coronary syndrome and prior to discharge she had a perfusion study showing no abnormality She tolerates her statin without muscle pain or weakness Lipid profile 10/22/2020 LDL 102 Past Medical History:  Diagnosis Date   Abnormal myocardial perfusion study 07/22/2018   Acute pharyngitis 09/26/2017   Adiposity 09/26/2013   AKI (acute kidney injury) 01/24/2017   Arthrofibrosis of knee joint, right 10/11/2013   Asthma    Chronic back pain 08/26/2019   Edema, unspecified 09/26/2013   Essential hypertension    Dx 30 ys ago; normal BP 130/60 (with med  compliance)   Exertional chest pain 07/22/2018   Facet arthropathy, lumbosacral 06/15/2015   Gastro-esophageal reflux disease without esophagitis 09/26/2013   Gonalgia 09/26/2013   Heart murmur 02/27/2016   History of cataracts    bilateral; s/p surgery   History of colon polyps    Hyperlipidemia 04/23/2020   Hypokalemia 09/26/2013   Intervertebral disc degeneration, 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   Left knee pain 04/06/2007   Left thyroid nodule 10/28/2017   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   Nonintractable episodic headache 07/27/2017   Nuclear sclerotic cataract of left eye 07/13/2014   OA (osteoarthritis) of knee    s/p left TKR 09/2010   OAB (overactive bladder) 07/19/2019   Obesity, Class II, BMI 35-39.9 08/26/2019   Positive TB test    Primary osteoarthritis of right knee 07/19/2013   Pure hypercholesterolemia 04/06/2007   Annotation: reports a history, and that she was on a medication for it   RBBB (right bundle branch block) 07/22/2018   S/P total knee replacement, right 08/26/2016   Snoring 08/26/2019   STOP BANG 4   Syncope 01/24/2017   Transaminitis 01/24/2017   Xerostomia 10/28/2017    Past Surgical History:  Procedure Laterality Date   CATARACT EXTRACTION, BILATERAL  2018   KNEE SURGERY     states 2 surgeries on each knee around 2016 and 2017   TUBAL LIGATION      Current Medications: Current Meds  Medication Sig   albuterol (VENTOLIN HFA) 108 (90 Base) MCG/ACT inhaler Inhale 2 puffs into the lungs every 6 (six) hours as needed for wheezing or shortness of breath.   amLODipine (NORVASC) 5 MG tablet Take 1 tablet (5 mg total) by mouth daily.   aspirin EC 81 MG tablet Take 81 mg by mouth daily.   cholecalciferol (VITAMIN D) 400 units TABS tablet Take 400 Units by mouth daily.   citalopram (CELEXA) 20 MG tablet Take 20 mg by mouth daily.   fluticasone (FLONASE) 50 MCG/ACT nasal spray Place 2 sprays into both nostrils daily.   hydrochlorothiazide (HYDRODIURIL) 25 MG  tablet Take 1 tablet by mouth once daily   hydrOXYzine (ATARAX) 25 MG tablet Take 0.5-3 tablets (12.5-75 mg total) by mouth 3 (three) times daily as needed for anxiety.   metoprolol succinate (TOPROL-XL) 50 MG 24 hr tablet Take 1 tablet (50 mg total) by mouth daily. TAKE 1 TABLET BY MOUTH ONCE DAILY WITH  OR  IMMEDIATELY  FOLLOWING  A  MEAL   Multiple Vitamin (MULTIVITAMIN) tablet Take 1 tablet by mouth daily.   nitroGLYCERIN (NITROSTAT) 0.4 MG SL tablet DISSOLVE ONE TABLET UNDER THE TONGUE EVERY 5 MINUTES AS NEEDED FOR CHEST PAIN.  DO NOT EXCEED A TOTAL OF 3 DOSES IN 15 MINUTES   oxybutynin (DITROPAN-XL) 10 MG 24 hr tablet TAKE 1 TABLET BY MOUTH AT BEDTIME   pantoprazole (PROTONIX) 40 MG tablet Take 1 tablet (40 mg total) by mouth daily.   rosuvastatin (CRESTOR) 10 MG tablet Take 1 tablet (10 mg total) by mouth daily.   telmisartan (MICARDIS) 40 MG tablet Take 1 tablet (40 mg total) by mouth daily.     Allergies:  Patient has no known allergies.   Social History   Socioeconomic History   Marital status: Married    Spouse name: Not on file   Number of children: Not on file   Years of education: 12   Highest education level: High school graduate  Occupational History   Occupation: Retired  Tobacco Use   Smoking status: Former    Types: Cigarettes   Smokeless tobacco: Never   Tobacco comments:    quit 30 years ago, 1980  Vaping Use   Vaping Use: Never used  Substance and Sexual Activity   Alcohol use: No   Drug use: No   Sexual activity: Yes    Birth control/protection: None  Other Topics Concern   Not on file  Social History Narrative   Not on file   Social Determinants of Health   Financial Resource Strain: Low Risk  (03/26/2021)   Overall Financial Resource Strain (CARDIA)    Difficulty of Paying Living Expenses: Not hard at all  Food Insecurity: No Food Insecurity (03/26/2021)   Hunger Vital Sign    Worried About Running Out of Food in the Last Year: Never true    Ran  Out of Food in the Last Year: Never true  Transportation Needs: No Transportation Needs (03/26/2021)   PRAPARE - Administrator, Civil Service (Medical): No    Lack of Transportation (Non-Medical): No  Physical Activity: Inactive (03/26/2021)   Exercise Vital Sign    Days of Exercise per Week: 0 days    Minutes of Exercise per Session: 0 min  Stress: No Stress Concern Present (03/26/2021)   [employer] of Occupational Health - Occupational Stress Questionnaire    Feeling of Stress : Not at all  Social Connections: Moderately Isolated (03/26/2021)   Social Connection and Isolation Panel [NHANES]    Frequency of Communication with Friends and Family: More than three times a week    Frequency of Social Gatherings with Friends and Family: More than three times a week    Attends Religious Services: More than 4 times per year    Active Member of [employer] or Organizations: No    Attends Banker Meetings: Never    Marital Status: Separated     Family History: The patient's family history includes Arthritis in her daughter and mother; Asthma in her daughter; Cancer in her father; Diabetes in her sister; Hypertension in her mother and sister; Kidney disease in her daughter; Memory loss in her maternal aunt, mother, and sister; Pancreatic cancer in her father. There is no history of Colon cancer, Colon polyps, Esophageal cancer, Stomach cancer, or Rectal cancer. ROS:   Please see the history of present illness.    All other systems reviewed and are negative.  EKGs/Labs/Other Studies Reviewed:    The following studies were reviewed today:   Recent Labs: 10/22/2020: ALT 15; Hemoglobin 12.9; Platelets 234.0; TSH 1.48 09/27/2021: BUN 18; Creatinine, Ser 1.09; Potassium 3.8; Sodium 140  Recent Lipid Panel    Component Value Date/Time   CHOL 182 10/22/2020 0932   CHOL 170 08/29/2019 1054   TRIG 61.0 10/22/2020 0932   HDL 67.20 10/22/2020 0932   HDL 68 08/29/2019 1054    CHOLHDL 3 10/22/2020 0932   VLDL 12.2 10/22/2020 0932   LDLCALC 102 (H) 10/22/2020 0932   LDLCALC 85 08/29/2019 1054    Physical Exam:    VS:  BP 120/82 (BP Location: Right Arm, Patient Position: Sitting, Cuff Size: Normal)   Pulse  60   Ht 5\' 2"  (1.575 m)   Wt 197 lb (89.4 kg)   SpO2 96%   BMI 36.03 kg/m     Wt Readings from Last 3 Encounters:  10/11/21 197 lb (89.4 kg)  09/27/21 201 lb 8 oz (91.4 kg)  06/17/21 196 lb 9.6 oz (89.2 kg)     GEN:  Well nourished, well developed in no acute distress HEENT: Normal NECK: No JVD; No carotid bruits LYMPHATICS: No lymphadenopathy CARDIAC: Carotid stenosis RRR, no murmurs, rubs, gallops RESPIRATORY:  Clear to auscultation without rales, wheezing or rhonchi  ABDOMEN: Soft, non-tender, non-distended MUSCULOSKELETAL:  No edema; No deformity  SKIN: Warm and dry NEUROLOGIC:  Alert and oriented x 3 PSYCHIATRIC:  Normal affect    Signed, 06/19/21, MD  10/11/2021 1:21 PM    [facility]

## 2021-10-11 NOTE — Patient Instructions (Signed)

## 2021-10-14 ENCOUNTER — Other Ambulatory Visit: Payer: Self-pay | Admitting: Family Medicine

## 2021-10-14 DIAGNOSIS — K219 Gastro-esophageal reflux disease without esophagitis: Secondary | ICD-10-CM

## 2021-10-28 ENCOUNTER — Encounter: Payer: Self-pay | Admitting: Family Medicine

## 2021-10-28 ENCOUNTER — Ambulatory Visit (INDEPENDENT_AMBULATORY_CARE_PROVIDER_SITE_OTHER): Payer: Medicare Other | Admitting: Family Medicine

## 2021-10-28 VITALS — BP 130/76 | HR 57 | Temp 98.0°F | Ht 62.0 in | Wt 200.4 lb

## 2021-10-28 DIAGNOSIS — I10 Essential (primary) hypertension: Secondary | ICD-10-CM

## 2021-10-28 DIAGNOSIS — F411 Generalized anxiety disorder: Secondary | ICD-10-CM | POA: Diagnosis not present

## 2021-10-28 DIAGNOSIS — Z0000 Encounter for general adult medical examination without abnormal findings: Secondary | ICD-10-CM

## 2021-10-28 DIAGNOSIS — M79604 Pain in right leg: Secondary | ICD-10-CM

## 2021-10-28 DIAGNOSIS — E785 Hyperlipidemia, unspecified: Secondary | ICD-10-CM

## 2021-10-28 LAB — CBC
HCT: 36.7 % (ref 36.0–46.0)
Hemoglobin: 12.2 g/dL (ref 12.0–15.0)
MCHC: 33.1 g/dL (ref 30.0–36.0)
MCV: 90.7 fl (ref 78.0–100.0)
Platelets: 238 10*3/uL (ref 150.0–400.0)
RBC: 4.05 Mil/uL (ref 3.87–5.11)
RDW: 13.8 % (ref 11.5–15.5)
WBC: 4.7 10*3/uL (ref 4.0–10.5)

## 2021-10-28 LAB — LIPID PANEL
Cholesterol: 171 mg/dL (ref 0–200)
HDL: 58.9 mg/dL (ref >39.00)
LDL Cholesterol: 101 mg/dL — ABNORMAL HIGH (ref 0–99)
NonHDL: 112.58
Total CHOL/HDL Ratio: 3
Triglycerides: 59 mg/dL (ref 0.0–149.0)
VLDL: 11.8 mg/dL (ref 0.0–40.0)

## 2021-10-28 LAB — COMPREHENSIVE METABOLIC PANEL
ALT: 16 U/L (ref 0–35)
AST: 17 U/L (ref 0–37)
Albumin: 4 g/dL (ref 3.5–5.2)
Alkaline Phosphatase: 59 U/L (ref 39–117)
BUN: 19 mg/dL (ref 6–23)
CO2: 28 mEq/L (ref 19–32)
Calcium: 9.6 mg/dL (ref 8.4–10.5)
Chloride: 104 mEq/L (ref 96–112)
Creatinine, Ser: 1 mg/dL (ref 0.40–1.20)
GFR: 56.78 mL/min — ABNORMAL LOW (ref >60.00)
Glucose, Bld: 91 mg/dL (ref 70–99)
Potassium: 3.9 mEq/L (ref 3.5–5.1)
Sodium: 139 mEq/L (ref 135–145)
Total Bilirubin: 0.4 mg/dL (ref 0.2–1.2)
Total Protein: 7 g/dL (ref 6.0–8.3)

## 2021-10-28 MED ORDER — AMLODIPINE BESYLATE 5 MG PO TABS: 5.0000 mg | ORAL_TABLET | Freq: Every day | ORAL | 3 refills | Status: DC

## 2021-10-28 MED ORDER — ROSUVASTATIN CALCIUM 10 MG PO TABS: 10.0000 mg | ORAL_TABLET | Freq: Every day | ORAL | 3 refills | Status: DC

## 2021-10-28 MED ORDER — HYDROCHLOROTHIAZIDE 25 MG PO TABS: 25.0000 mg | ORAL_TABLET | Freq: Every day | ORAL | 2 refills | Status: DC

## 2021-10-28 MED ORDER — HYDROXYZINE HCL 25 MG PO TABS: 12.5000 mg | ORAL_TABLET | Freq: Three times a day (TID) | ORAL | 2 refills | Status: DC | PRN

## 2021-10-28 NOTE — Progress Notes (Signed)
Chief Complaint  Patient presents with   Annual Exam    She had a 1 month f/u Friday//canceled that appt.   Leg Pain    right     Well Woman Cynthia Montgomery is here for a complete physical.   Her last physical was >1 year ago.  Current diet: in general, a "healthy" diet. Current exercise: active around house. Weight is stable and she denies daytime fatigue. Seatbelt? Yes Advanced directive? No  Health Maintenance Colonoscopy- Yes Shingrix- Yes DEXA- Yes Mammogram- Yes Tetanus- Yes Pneumonia- Yes Hep C screen- Yes  Leg pain R leg pain started around 1 week ago. R outer leg/knee, sharp pain. Nothing specific triggers. Lasts around 5-10 min. Seems to resolve by itself. No inj or change in activity. She does stay active. No bruising, redness, swelling, neuro s/s's. Worse at night. No pain w exertion. Has not tried anything for pain.   Past Medical History:  Diagnosis Date   Abnormal myocardial perfusion study 07/22/2018   Acute pharyngitis 09/26/2017   Adiposity 09/26/2013   AKI (acute kidney injury) 01/24/2017   Arthrofibrosis of knee joint, right 10/11/2013   Asthma    Chronic back pain 08/26/2019   Edema, unspecified 09/26/2013   Essential hypertension    Dx 30 ys ago; normal BP 130/60 (with med compliance)   Exertional chest pain 07/22/2018   Facet arthropathy, lumbosacral 06/15/2015   Gastro-esophageal reflux disease without esophagitis 09/26/2013   Gonalgia 09/26/2013   Heart murmur 02/27/2016   History of cataracts    bilateral; s/p surgery   History of colon polyps    Hyperlipidemia 04/23/2020   Hypokalemia 09/26/2013   Intervertebral disc degeneration, 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   Left knee pain 04/06/2007   Left thyroid nodule 10/28/2017   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   Nonintractable episodic headache 07/27/2017   Nuclear sclerotic cataract of left eye 07/13/2014   OA (osteoarthritis) of knee    s/p left TKR 09/2010   OAB (overactive bladder) 07/19/2019    Obesity, Class II, BMI 35-39.9 08/26/2019   Positive TB test    Primary osteoarthritis of right knee 07/19/2013   Pure hypercholesterolemia 04/06/2007   Annotation: reports a history, and that she was on a medication for it   RBBB (right bundle branch block) 07/22/2018   S/P total knee replacement, right 08/26/2016   Snoring 08/26/2019   STOP BANG 4   Syncope 01/24/2017   Transaminitis 01/24/2017   Xerostomia 10/28/2017     Past Surgical History:  Procedure Laterality Date   CATARACT EXTRACTION, BILATERAL  2018   KNEE SURGERY     states 2 surgeries on each knee around 2016 and 2017   TUBAL LIGATION      Medications  Current Outpatient Medications on File Prior to Visit  Medication Sig Dispense Refill   albuterol (VENTOLIN HFA) 108 (90 Base) MCG/ACT inhaler Inhale 2 puffs into the lungs every 6 (six) hours as needed for wheezing or shortness of breath. 18 g 4   amLODipine (NORVASC) 5 MG tablet Take 1 tablet (5 mg total) by mouth daily. 90 tablet 3   aspirin EC 81 MG tablet Take 81 mg by mouth daily.     cholecalciferol (VITAMIN D) 400 units TABS tablet Take 400 Units by mouth daily.     citalopram (CELEXA) 20 MG tablet Take 20 mg by mouth daily.     fluticasone (FLONASE) 50 MCG/ACT nasal spray Place 2 sprays into both nostrils daily. 16 g 6   hydrochlorothiazide (HYDRODIURIL)  25 MG tablet Take 1 tablet by mouth once daily 30 tablet 0   hydrOXYzine (ATARAX) 25 MG tablet Take 0.5-3 tablets (12.5-75 mg total) by mouth 3 (three) times daily as needed for anxiety. 30 tablet 2   metoprolol succinate (TOPROL-XL) 50 MG 24 hr tablet Take 1 tablet (50 mg total) by mouth daily. TAKE 1 TABLET BY MOUTH ONCE DAILY WITH  OR  IMMEDIATELY  FOLLOWING  A  MEAL 90 tablet 2   Multiple Vitamin (MULTIVITAMIN) tablet Take 1 tablet by mouth daily.     nitroGLYCERIN (NITROSTAT) 0.4 MG SL tablet DISSOLVE ONE TABLET UNDER THE TONGUE EVERY 5 MINUTES AS NEEDED FOR CHEST PAIN.  DO NOT EXCEED A TOTAL OF 3 DOSES  IN 15 MINUTES 25 tablet 0   oxybutynin (DITROPAN-XL) 10 MG 24 hr tablet TAKE 1 TABLET BY MOUTH AT BEDTIME 90 tablet 0   pantoprazole (PROTONIX) 40 MG tablet Take 1 tablet by mouth once daily 90 tablet 0   rosuvastatin (CRESTOR) 10 MG tablet Take 1 tablet (10 mg total) by mouth daily. 90 tablet 3   telmisartan (MICARDIS) 40 MG tablet Take 1 tablet (40 mg total) by mouth daily. 30 tablet 5    Allergies No Known Allergies  Review of Systems: Constitutional:  no fevers Eye:  no recent significant change in vision Ears:  No changes in hearing Nose/Mouth/Throat:  no complaints of nasal congestion, no sore throat Cardiovascular: no chest pain Respiratory:  No shortness of breath Gastrointestinal:  No change in bowel habits GU:  Female: negative for dysuria Integumentary:  no abnormal skin lesions reported Neurologic:  no headaches Endocrine:  denies unexplained weight changes  Exam BP 130/76   Pulse (!) 57   Temp 98 F (36.7 C) (Oral)   Ht 5\' 2"  (1.575 m)   Wt 200 lb 6 oz (90.9 kg)   SpO2 93%   BMI 36.65 kg/m  General:  well developed, well nourished, in no apparent distress Skin:  no significant moles, warts, or growths Head:  no masses, lesions, or tenderness Eyes:  pupils equal and round, sclera anicteric without injection Ears:  canals without lesions, TMs shiny without retraction, no obvious effusion, no erythema Nose:  nares patent, septum midline, mucosa normal, and no drainage or sinus tenderness Throat/Pharynx:  lips and gingiva without lesion; tongue and uvula midline; non-inflamed pharynx; no exudates or postnasal drainage Neck: neck supple without adenopathy, thyromegaly, or masses Lungs:  clear to auscultation, breath sounds equal bilaterally, no respiratory distress Cardio:  regular rate and rhythm, no bruits or LE edema Abdomen:  abdomen soft, nontender; bowel sounds normal; no masses or organomegaly Genital: Deferred Neuro:  gait normal; deep tendon reflexes  normal and symmetric MSK: TTP over lateral/prox R calf and mild ttp over prox peroneus msc group.  Psych: well oriented with normal range of affect and appropriate judgment/insight  Assessment and Plan  Well adult exam  Hyperlipidemia, unspecified hyperlipidemia type - Plan: Comprehensive metabolic panel, Lipid panel  Essential hypertension - Plan: CBC, hydrochlorothiazide (HYDRODIURIL) 25 MG tablet  GAD (generalized anxiety disorder) - Plan: hydrOXYzine (ATARAX) 25 MG tablet  Pain of right lower extremity   Well 71 y.o. female. Counseled on diet and exercise. Advanced directive form provided today.  Other orders as above. LE pain: Seems to be a calf strain.  Heat, ice, Tylenol, stretches and exercises.  Send a message in 1 week if no improvement.  We will set her up with physical therapy. Follow up in 6 mo or  prn. The patient voiced understanding and agreement to the plan.  Jilda Roche [place], DO 10/28/21 8:42 AM

## 2021-10-28 NOTE — Patient Instructions (Addendum)
Give [place] 2-3 business days to get the results of your labs back.   Keep the diet clean and stay active.  Please get me a copy of your advanced directive form at your convenience.   I recommend getting the flu shot in mid October. This suggestion would change if the CDC comes out with a different recommendation.   OK to take Tylenol 1000 mg (2 extra strength tabs) or 975 mg (3 regular strength tabs) every 6 hours as needed.  Ice/cold pack over area for 10-15 min twice daily.  Heat (pad or rice pillow in microwave) over affected area, 10-15 minutes twice daily.   Send message in 3-4 weeks if no better with your leg.   Let us know if you need anything.  Stretching and range of motion exercises These exercises warm up your muscles and joints and improve the movement and flexibility of your lower leg. These exercises also help to relieve pain and stiffness.  Exercise A: Gastrocnemius stretch Sit with your left / right leg extended. Loop a belt or towel around the ball of your left / right foot. The ball of your foot is on the walking surface, right under your toes. Hold both ends of the belt or towel. Keep your left / right ankle and foot relaxed and keep your knee straight while you use the belt or towel to pull your foot and ankle toward you. Stop at the first point of resistance. Hold this position for 30 seconds. Repeat 2 times. Complete this exercise 3 times per week.  Exercise B: Ankle alphabet Sit with your left / right leg supported at the lower leg. Do not rest your foot on anything. Make sure your foot has room to move freely. Think of your left / right foot as a paintbrush, and move your foot to trace each letter of the alphabet in the air. Keep your hip and knee still while you trace. Trace every letter from A to Z. Repeat 2 times. Complete this exercise 3 times per week.  Strengthening exercises These exercises build strength and endurance in your lower leg. Endurance is  the ability to use your muscles for a long time, even after they get tired.  Exercise C: Plantar flexors with band Sit with your left / right leg extended. Loop a rubber exercise band or tube around the ball of your left / right foot. The ball of your foot is on the walking surface, right under your toes. While holding both ends of the band or tube, slowly point your toes downward, pushing them away from you. Hold this position for 3 seconds. Slowly return your foot to the starting position and repeat for a total of 10 repetitions. Repeat 2 times. Complete this exercise 3 times per week.  Exercise D: Plantar flexors, standing Stand with your feet shoulder-width apart. Place your hands on a wall or table to steady yourself as needed, but try not to use it very much for support. Rise up on your toes. If this exercise is too easy, try these options: Shift your weight toward your left / right leg until you feel challenged. If told by your health care provider, stand on your left / right foot only. Hold this position for 3 seconds. Repeat for a total of 10 repetitions. Repeat 2 times. Complete this exercise 3 times per week.  Exercise E: Plantar flexors, eccentric Stand on the balls of your feet on the edge of a step. The ball of your foot  is on the walking surface, right under your toes. Place your hands on a wall or railing for balance as needed, but try not to lean on it for support. Rise up on your toes, using both legs to help. Slowly shift all of your weight to your left / right foot and lift your other foot off the step. Slowly lower your left / right heel so it drops below the level of the step. You will feel a slight stretch in your left / right calf. Put your other foot back onto the step. Repeat 2 times. Complete this exercise 3 times per week. This information is not intended to replace advice given to you by your health care provider. Make sure you discuss any questions you have  with your health care provider.

## 2021-11-01 ENCOUNTER — Ambulatory Visit: Payer: Medicare Other | Admitting: Family Medicine

## 2021-11-26 ENCOUNTER — Other Ambulatory Visit: Payer: Self-pay | Admitting: Family Medicine

## 2021-12-17 DIAGNOSIS — Z87891 Personal history of nicotine dependence: Secondary | ICD-10-CM | POA: Diagnosis not present

## 2021-12-17 DIAGNOSIS — I129 Hypertensive chronic kidney disease with stage 1 through stage 4 chronic kidney disease, or unspecified chronic kidney disease: Secondary | ICD-10-CM | POA: Diagnosis not present

## 2021-12-17 DIAGNOSIS — S8991XA Unspecified injury of right lower leg, initial encounter: Secondary | ICD-10-CM | POA: Diagnosis not present

## 2021-12-17 DIAGNOSIS — R296 Repeated falls: Secondary | ICD-10-CM | POA: Diagnosis not present

## 2021-12-17 DIAGNOSIS — N182 Chronic kidney disease, stage 2 (mild): Secondary | ICD-10-CM | POA: Diagnosis not present

## 2021-12-17 DIAGNOSIS — M25561 Pain in right knee: Secondary | ICD-10-CM | POA: Diagnosis not present

## 2021-12-17 DIAGNOSIS — E041 Nontoxic single thyroid nodule: Secondary | ICD-10-CM | POA: Diagnosis not present

## 2021-12-17 DIAGNOSIS — S0990XA Unspecified injury of head, initial encounter: Secondary | ICD-10-CM | POA: Diagnosis not present

## 2021-12-18 ENCOUNTER — Telehealth: Payer: Self-pay

## 2021-12-18 NOTE — Telephone Encounter (Signed)
Pt evaluated/treated in ER.   [facility] [place] RECORDAccessNurse Patient Name:Cynthia Montgomery Initial Comment Caller states she wanted to schedule an appt with her doctor. She fell and her leg hurts. Translation No Nurse Assessment Nurse: Noralee Stain, RN, Christina Date/Time (Eastern Time): 12/17/2021 5:06:58 PM Confirm and document reason for call. If symptomatic, describe symptoms. ---She fell by tripping on the sidewalk and her right leg hurts. She has a headache and her head hurts. Pt fell onto her face and broke front teeth and split her lips. Her pinky finger on her left hand also is bothering her. This happened today. Does the patient have any new or worsening symptoms? ---Yes Will a triage be completed? ---Yes Related visit to physician within the last 2 weeks? ---No Does the PT have any chronic conditions? (i.e. diabetes, asthma, this includes High risk factors for pregnancy, etc.) ---Yes List chronic conditions. ---high blood pressure, takes a baby aspirin Is this a behavioral health or substance abuse call? ---No Guidelines Guideline Title Affirmed Question Affirmed Notes Nurse Date/Time Eilene Ghazi Time) Face Injury Neck pain Noralee Stain, RN, Margreta Journey 12/17/2021 5:15:08 PM Disp. Time Eilene Ghazi Time) Disposition Final User 12/17/2021 5:17:50 PM Go to ED Now Yes Noralee Stain, RN, Margreta Journey Final Disposition 12/17/2021 5:17:50 PM Go to ED Now Yes Noralee Stain, RN, Christina PLEASE NOTE: All timestamps contained within this report are represented as [place] Standard Time. CONFIDENTIALTY NOTICE: This fax transmission is intended only for the addressee. It contains information that is legally privileged, confidential or otherwise protected from use or disclosure. If you are not the intended recipient, you are strictly prohibited from reviewing, disclosing, copying using or disseminating any of this information or taking any action in reliance on or regarding this  information. If you have received this fax in error, please notify us immediately by telephone so that we can arrange for its return to [place]. Phone: [phone], Toll-Free: [phone], Fax: [phone] Page: 2 of 2 Call Id: [id number] Caller Disagree/Comply Comply Caller Understands Yes PreDisposition Did not know what to do Care Advice Given Per Guideline GO TO ED NOW: * You need to be seen in the Emergency Department. * Go to the ED at ___________ [place] now. Drive carefully. CARE ADVICE given per Face Injury (Adult) guideline. * It is better and safer if another adult drives instead of you. ANOTHER ADULT SHOULD DRIVE: Comments User: Aletta Edouard, RN Date/Time (Eastern Time): 12/17/2021 5:11:32 PM Pt was seen in the ED for this already and was discharged. User: Aletta Edouard, RN Date/Time Eilene Ghazi Time): 12/17/2021 5:18:12 PM Pts symptoms are worse from when she was evaluated in the ER earlier today

## 2021-12-18 NOTE — Telephone Encounter (Signed)
Was evaluated on 12/17/21 in the ER

## 2021-12-19 ENCOUNTER — Encounter: Payer: Self-pay | Admitting: Family Medicine

## 2021-12-19 ENCOUNTER — Ambulatory Visit (HOSPITAL_BASED_OUTPATIENT_CLINIC_OR_DEPARTMENT_OTHER)
Admission: RE | Admit: 2021-12-19 | Discharge: 2021-12-19 | Disposition: A | Discharge status: Home / Self Care | Payer: Medicare Other | Source: Ambulatory Visit | Attending: Family Medicine | Admitting: Family Medicine

## 2021-12-19 ENCOUNTER — Ambulatory Visit (INDEPENDENT_AMBULATORY_CARE_PROVIDER_SITE_OTHER): Payer: Medicare Other | Admitting: Family Medicine

## 2021-12-19 VITALS — BP 132/78 | HR 60 | Temp 98.2°F | Resp 18 | Ht 62.0 in | Wt 192.4 lb

## 2021-12-19 DIAGNOSIS — M25512 Pain in left shoulder: Secondary | ICD-10-CM

## 2021-12-19 DIAGNOSIS — M79645 Pain in left finger(s): Secondary | ICD-10-CM | POA: Diagnosis not present

## 2021-12-19 DIAGNOSIS — K0889 Other specified disorders of teeth and supporting structures: Secondary | ICD-10-CM

## 2021-12-19 DIAGNOSIS — Z23 Encounter for immunization: Secondary | ICD-10-CM

## 2021-12-19 DIAGNOSIS — E041 Nontoxic single thyroid nodule: Secondary | ICD-10-CM

## 2021-12-19 DIAGNOSIS — M79604 Pain in right leg: Secondary | ICD-10-CM

## 2021-12-19 DIAGNOSIS — S060X0A Concussion without loss of consciousness, initial encounter: Secondary | ICD-10-CM | POA: Diagnosis not present

## 2021-12-19 DIAGNOSIS — E042 Nontoxic multinodular goiter: Secondary | ICD-10-CM | POA: Diagnosis not present

## 2021-12-19 MED ORDER — TIZANIDINE HCL 4 MG PO TABS: 4.0000 mg | ORAL_TABLET | Freq: Four times a day (QID) | ORAL | 0 refills | Status: DC | PRN

## 2021-12-19 NOTE — Progress Notes (Signed)
Musculoskeletal Exam  Patient: Cynthia Montgomery DOB: 04-Aug-1950  DOS: 12/19/2021  SUBJECTIVE:  Chief Complaint:   Chief Complaint  Patient presents with   Fall    12/17/2021    Cynthia Montgomery is a 71 y.o.  female for evaluation and treatment of pain.   Onset:  2 days ago.  She tripped on uneven sidewalk and hit her head.  No loss of consciousness. Location: behind R knee, L hand, L shoulder, headache, lost teeth Character:  aching  Progression of issue:  is unchanged Associated symptoms: Front tooth was knocked loose, headaches No nausea, vomiting, vision changes, focal weakness, difficulty speaking, trouble with speech Treatment: to date has been Tylenol, various OTC topicals, Icy Hot. Imaging at [facility] ED neg with the exception of an enlarged thyroid nodule. Neurovascular symptoms: no  Past Medical History:  Diagnosis Date   Abnormal myocardial perfusion study 07/22/2018   Acute pharyngitis 09/26/2017   Adiposity 09/26/2013   AKI (acute kidney injury) 01/24/2017   Arthrofibrosis of knee joint, right 10/11/2013   Asthma    Chronic back pain 08/26/2019   Edema, unspecified 09/26/2013   Essential hypertension    Dx 67 ys ago; normal BP 130/60 (with med compliance)   Exertional chest pain 07/22/2018   Facet arthropathy, lumbosacral 06/15/2015   Gastro-esophageal reflux disease without esophagitis 09/26/2013   Gonalgia 09/26/2013   Heart murmur 02/27/2016   History of cataracts    bilateral; s/p surgery   History of colon polyps    Hyperlipidemia 04/23/2020   Hypokalemia 09/26/2013   Intervertebral disc degeneration, 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   Left knee pain 04/06/2007   Left thyroid nodule 10/28/2017   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   Nonintractable episodic headache 07/27/2017   Nuclear sclerotic cataract of left eye 07/13/2014   OA (osteoarthritis) of knee    s/p left TKR 09/2010   OAB (overactive bladder) 07/19/2019   Obesity, Class II, BMI 35-39.9 08/26/2019    Positive TB test    Primary osteoarthritis of right knee 07/19/2013   Pure hypercholesterolemia 04/06/2007   Annotation: reports a history, and that she was on a medication for it   RBBB (right bundle branch block) 07/22/2018   S/P total knee replacement, right 08/26/2016   Snoring 08/26/2019   STOP BANG 4   Syncope 01/24/2017   Transaminitis 01/24/2017   Xerostomia 10/28/2017    Objective: VITAL SIGNS: BP 132/78 (BP Location: Left Arm, Cuff Size: Large)   Pulse 60   Temp 98.2 F (36.8 C)   Resp 18   Ht 5\' 2"  (1.575 m)   Wt 192 lb 6.4 oz (87.3 kg)   SpO2 98%   BMI 35.19 kg/m  Constitutional: Well formed, well developed. No acute distress. Thorax & Lungs: No accessory muscle use Musculoskeletal:   No obvious deformity or ecchymosis.  There is tenderness over the bilateral trapezius muscle, anterior shoulder region, PIP and DIP of the fifth left digit of the hand, the popliteal region.  She has pain with resisted right knee flexion with less pain trying to stand on her tiptoes.  Normal active and passive range of motion of both the right knee and the left shoulder. Neurologic: Normal sensory function. No focal deficits noted. DTR's equal and symmetric throughout. No clonus.  Gait is cautious and antalgic.  Grip strength is adequate.  There was limited lower extremity muscle testing secondary to pain. Psychiatric: Normal mood. Age appropriate judgment and insight. Alert & oriented x 3.    Assessment:  Concussion  without loss of consciousness, initial encounter  Pain of right lower extremity - Plan: tiZANidine (ZANAFLEX) 4 MG tablet  Thyroid nodule greater than or equal to 1.5 cm in diameter incidentally noted on imaging study - Plan: US THYROID  Acute pain of left shoulder - Plan: tiZANidine (ZANAFLEX) 4 MG tablet  Pain of finger of left hand  Need for influenza vaccination - Plan: Flu Vaccine QUAD High Dose(Fluad)  Loose tooth due to trauma  Plan: A list of supplements  associated with specific concussion symptoms provided in her AVS.  We will pursue a referral to the concussion clinic if no improvement in the next 2 to 3 weeks.  I suspect she strained her hamstring when she fell.  Stretches/exercises, heat, ice, Tylenol.  We will resend in a nonsedating muscle relaxer, she has done well on tizanidine in the past. We will order an ultrasound to follow-up on the finding from the ER CT scan. Due to likely fall on outstretched hand.  I believe the trapezius stretches exercises should help with this.  Heat, ice, Tylenol. I think she jammed her finger on the way down as well.  Ice, Tylenol, consider buddy taping.  Could consider occupational therapy if not improving. She has an appoint with her dentist today. Flu shot today. F/u as originally scheduled. The patient voiced understanding and agreement to the plan.  I spent 30 minutes with the patient discussing the above in addition to reviewing her chart/ED visit on the same day of the visit.   Cynthia Roche [place], DO 12/19/21  8:30 AM

## 2021-12-19 NOTE — Patient Instructions (Addendum)
Heat (pad or rice pillow in microwave) over affected area, 10-15 minutes twice daily.   Ice/cold pack over area for 10-15 min twice daily.  OK to take Tylenol 1000 mg (2 extra strength tabs) or 975 mg (3 regular strength tabs) every 6 hours as needed.  Send me a message if we are not improving.   Send me a message if you do not hear anything about your ultrasound of your thyroid.   Let us know if you need anything.  Fish oil 3 grams daily for 10 days then 2 grams daily  Vitamin D 4000 IU daily  CoQ10 200mg  daily for headaches  Tart cherry extract any dose at night  To help improve COGNITIVE function: Using fish oil/omega 3 that is 1000 mg (or roughly 600 mg EPA/DHA), starting as soon as possible after concussion, take: 3 tabs THREE TIMES a day  for the first 3 days, then (you will smell a little, sorry) 3 tabs TWICE DAILY  for the next 3 days, then 3 tabs ONCE DAILY  for the next 10 days    To help reduce HEADACHES: Coenzyme Q10 160mg  ONCE DAILY Riboflavin/Vitamin B2 400mg  ONCE DAILY Magnesium oxide 400 mg ONE-TWO TIMES DAILY May stop after headaches are resolved.                                                                                               To help with INSOMNIA: Melatonin 3-5mg  AT BEDTIME Tart cherry extract, any dose at night    Other medicines to help decrease inflammation Alpha Lipoic Acid 100mg  TWICE DAILY Turmeric 500mg  twice daily Iron 65mg  elemental daily Vitamin D 4000 IU daily for 2 weeks then 2000 IU daily thereafter.  Trapezius stretches/exercises Do exercises exactly as told by your health care provider and adjust them as directed. It is normal to feel mild stretching, pulling, tightness, or discomfort as you do these exercises, but you should stop right away if you feel sudden pain or your pain gets worse.   Stretching and range of motion exercises These exercises warm up your muscles and joints and improve the movement and flexibility of your  shoulder. These exercises can also help to relieve pain, numbness, and tingling. If you are unable to do any of the following for any reason, do not further attempt to do it.   Exercise A: Flexion, standing     Stand and hold a broomstick, a cane, or a similar object. Place your hands a little more than shoulder-width apart on the object. Your left / right hand should be palm-up, and your other hand should be palm-down. Push the stick to raise your left / right arm out to your side and then over your head. Use your other hand to help move the stick. Stop when you feel a stretch in your shoulder, or when you reach the angle that is recommended by your health care provider. Avoid shrugging your shoulder while you raise your arm. Keep your shoulder blade tucked down toward your spine. Hold for 30 seconds. Slowly return to the starting position. Repeat 2 times. Complete this exercise  3 times per week.  Exercise B: Abduction, supine     Lie on your back and hold a broomstick, a cane, or a similar object. Place your hands a little more than shoulder-width apart on the object. Your left / right hand should be palm-up, and your other hand should be palm-down. Push the stick to raise your left / right arm out to your side and then over your head. Use your other hand to help move the stick. Stop when you feel a stretch in your shoulder, or when you reach the angle that is recommended by your health care provider. Avoid shrugging your shoulder while you raise your arm. Keep your shoulder blade tucked down toward your spine. Hold for 30 seconds. Slowly return to the starting position. Repeat 2 times. Complete this exercise 3 times per week.  Exercise C: Flexion, active-assisted     Lie on your back. You may bend your knees for comfort. Hold a broomstick, a cane, or a similar object. Place your hands about shoulder-width apart on the object. Your palms should face toward your feet. Raise the stick and  move your arms over your head and behind your head, toward the floor. Use your healthy arm to help your left / right arm move farther. Stop when you feel a gentle stretch in your shoulder, or when you reach the angle where your health care provider tells you to stop. Hold for 30 seconds. Slowly return to the starting position. Repeat 2 times. Complete this exercise 3 times per week.  Exercise D: External rotation and abduction     Stand in a door frame with one of your feet slightly in front of the other. This is called a staggered stance. Choose one of the following positions as told by your health care provider: Place your hands and forearms on the door frame above your head. Place your hands and forearms on the door frame at the height of your head. Place your hands on the door frame at the height of your elbows. Slowly move your weight onto your front foot until you feel a stretch across your chest and in the front of your shoulders. Keep your head and chest upright and keep your abdominal muscles tight. Hold for 30 seconds. To release the stretch, shift your weight to your back foot. Repeat 2 times. Complete this stretch 3 times per week.  Strengthening exercises These exercises build strength and endurance in your shoulder. Endurance is the ability to use your muscles for a long time, even after your muscles get tired. Exercise E: Scapular depression and adduction  Sit on a stable chair. Support your arms in front of you with pillows, armrests, or a tabletop. Keep your elbows in line with the sides of your body. Gently move your shoulder blades down toward your middle back. Relax the muscles on the tops of your shoulders and in the back of your neck. Hold for 3 seconds. Slowly release the tension and relax your muscles completely before doing this exercise again. Repeat for a total of 10 repetitions. After you have practiced this exercise, try doing the exercise without the arm  support. Then, try the exercise while standing instead of sitting. Repeat 2 times. Complete this exercise 3 times per week.  Exercise F: Shoulder abduction, isometric     Stand or sit about 4-6 inches (10-15 cm) from a wall with your left / right side facing the wall. Bend your left / right elbow and gently press  your elbow against the wall. Increase the pressure slowly until you are pressing as hard as you can without shrugging your shoulder. Hold for 3 seconds. Slowly release the tension and relax your muscles completely. Repeat for a total of 10 repetitions. Repeat 2 times. Complete this exercise 3 times per week.  Exercise G: Shoulder flexion, isometric     Stand or sit about 4-6 inches (10-15 cm) away from a wall with your left / right side facing the wall. Keep your left / right elbow straight and gently press the top of your fist against the wall. Increase the pressure slowly until you are pressing as hard as you can without shrugging your shoulder. Hold for 10-15 seconds. Slowly release the tension and relax your muscles completely. Repeat for a total of 10 repetitions. Repeat 2 times. Complete this exercise 3 times per week.  Exercise H: Internal rotation     Sit in a stable chair without armrests, or stand. Secure an exercise band at your left / right side, at elbow height. Place a soft object, such as a folded towel or a small pillow, under your left / right upper arm so your elbow is a few inches (about 8 cm) away from your side. Hold the end of the exercise band so the band stretches. Keeping your elbow pressed against the soft object under your arm, move your forearm across your body toward your abdomen. Keep your body steady so the movement is only coming from your shoulder. Hold for 3 seconds. Slowly return to the starting position. Repeat for a total of 10 repetitions. Repeat 2 times. Complete this exercise 3 times per week.  Exercise I: External rotation     Sit  in a stable chair without armrests, or stand. Secure an exercise band at your left / right side, at elbow height. Place a soft object, such as a folded towel or a small pillow, under your left / right upper arm so your elbow is a few inches (about 8 cm) away from your side. Hold the end of the exercise band so the band stretches. Keeping your elbow pressed against the soft object under your arm, move your forearm out, away from your abdomen. Keep your body steady so the movement is only coming from your shoulder. Hold for 3 seconds. Slowly return to the starting position. Repeat for a total of 10 repetitions. Repeat 2 times. Complete this exercise 3 times per week. Exercise J: Shoulder extension  Sit in a stable chair without armrests, or stand. Secure an exercise band to a stable object in front of you so the band is at shoulder height. Hold one end of the exercise band in each hand. Your palms should face each other. Straighten your elbows and lift your hands up to shoulder height. Step back, away from the secured end of the exercise band, until the band stretches. Squeeze your shoulder blades together and pull your hands down to the sides of your thighs. Stop when your hands are straight down by your sides. Do not let your hands go behind your body. Hold for 3 seconds. Slowly return to the starting position. Repeat for a total of 10 repetitions. Repeat 2 times. Complete this exercise 3 times per week.  Exercise K: Shoulder extension, prone     Lie on your abdomen on a firm surface so your left / right arm hangs over the edge. Hold a 5 lb weight in your hand so your palm faces in toward your  body. Your arm should be straight. Squeeze your shoulder blade down toward the middle of your back. Slowly raise your arm behind you, up to the height of the surface that you are lying on. Keep your arm straight. Hold for 3 seconds. Slowly return to the starting position and relax your muscles. Repeat  for a total of 10 repetitions. Repeat 2 times. Complete this exercise 3 times per week.   Exercise L: Horizontal abduction, prone  Lie on your abdomen on a firm surface so your left / right arm hangs over the edge. Hold a 5 lb weight in your hand so your palm faces toward your feet. Your arm should be straight. Squeeze your shoulder blade down toward the middle of your back. Bend your elbow so your hand moves up, until your elbow is bent to an "L" shape (90 degrees). With your elbow bent, slowly move your forearm forward and up. Raise your hand up to the height of the surface that you are lying on. Your upper arm should not move, and your elbow should stay bent. At the top of the movement, your palm should face the floor. Hold for 3 seconds. Slowly return to the starting position and relax your muscles. Repeat for a total of 10 repetitions. Repeat 2 times. Complete this exercise 3 times per week.  Exercise M: Horizontal abduction, standing  Sit on a stable chair, or stand. Secure an exercise band to a stable object in front of you so the band is at shoulder height. Hold one end of the exercise band in each hand. Straighten your elbows and lift your hands straight in front of you, up to shoulder height. Your palms should face down, toward the floor. Step back, away from the secured end of the exercise band, until the band stretches. Move your arms out to your sides, and keep your arms straight. Hold for 3 seconds. Slowly return to the starting position. Repeat for a total of 10 repetitions. Repeat 2 times. Complete this exercise 3 times per week.  Exercise N: Scapular retraction and elevation  Sit on a stable chair, or stand. Secure an exercise band to a stable object in front of you so the band is at shoulder height. Hold one end of the exercise band in each hand. Your palms should face each other. Sit in a stable chair without armrests, or stand. Step back, away from the secured end of  the exercise band, until the band stretches. Squeeze your shoulder blades together and lift your hands over your head. Keep your elbows straight. Hold for 3 seconds. Slowly return to the starting position. Repeat for a total of 10 repetitions. Repeat 2 times. Complete this exercise 3 times per week.  This information is not intended to replace advice given to you by your health care provider. Make sure you discuss any questions you have with your health care provider. Document Released: 03/10/2005 Document Revised: 11/15/2015 Document Reviewed: 01/25/2015 Elsevier Interactive Patient Education  2017 [place] [facility]  It is normal to feel mild stretching, pulling, tightness, or discomfort as you do these exercises, but you should stop right away if you feel sudden pain or your pain gets worse.  Stretching and range of motion exercises These exercises warm up your muscles and joints and improve the movement and flexibility of your thigh. These exercises also help to relieve pain, numbness, and tingling. Exercise A: Hamstring stretch, supine    Lie on your back. Loop a belt  or towel across the ball of your left / right foot The ball of your foot is on the walking surface, right under your toes. Straighten your left / right knee and slowly pull on the belt to raise your leg. Stop when you feel a gentle stretch behind your left / right knee or thigh. Do not allow the knee to bend. Keep your other leg flat on the floor. Hold this position for 30 seconds. Repeat 2 times. Complete this exercise 3 times a week. Strengthening exercises These exercises build strength and endurance in your thigh. Endurance is the ability to use your muscles for a long time, even after they get tired. Exercise B: Straight leg raises (hip extensors) Lie on your belly on a bed or a firm surface with a pillow under your hips. Bend your left / right knee so your foot is straight up in the  air. Squeeze your buttock muscles and lift your left / right thigh off the bed. Do not let your back arch. Hold this position for 3 seconds. Slowly return to the starting position. Let your muscles relax completely before you do another repetition. Repeat 2 times. Complete this exercise 3 times a week. Exercise C: Bridge (hip extensors)     Lie on your back on a firm surface with your knees bent and your feet flat on the floor. Tighten your buttocks muscles and lift your bottom off the floor until your trunk is level with your thighs. You should feel the muscles working in your buttocks and the back of your thighs. If you do not feel these muscles, slide your feet 1-2 inches (2.5-5 cm) farther away from your buttocks. Do not arch your back. Hold this position for 3 seconds. Slowly lower your hips to the starting position. Let your buttocks muscles relax completely between repetitions. If this exercise is too easy, try doing it with your arms crossed over your chest. Repeat 2 times. Complete this exercise 3 times a week. Exercise D: Hamstring eccentric, prone Lie on your belly on a bed or on the floor. Start with your legs straight. Cross your legs at the ankles with your left / right leg on top. Using your bottom leg to do the work, bend both knees. Using just your left / right leg alone, slowly lower your leg back down toward the bed. Add a 5 lb weight as told by your health care provider. Let your muscles relax completely between repetitions. Repeat 2 times. Complete this exercise 3 times a week. Exercise E: Squats Stand in front of a table, with your feet and knees pointing straight ahead. You may rest your hands on the table for balance but not for support. Slowly bend your knees and lower your hips like you are going to sit in a chair. Keep your thighs straight or pointed slightly outward. Keep your weight over your heels, not over your toes. Keep your lower legs upright so they are  parallel with the table legs. Do not let your hips go lower than your knees. Stop when your knees are bent to the shape of an upside-down letter "L" (90 degree angle). Do not bend lower than told by your health care provider. If your knee pain increases, do not bend as low. Hold the squat position 1-2 seconds. Slowly push with your legs to return to standing. Do not use your hands to pull yourself to standing. Repeat 2 times. Complete this exercise 3 times a week. Make sure you discuss  any questions you have with your health care provider. Document Released: 03/10/2005 Document Revised: 11/15/2015 Document Reviewed: 12/12/2014 Elsevier Interactive Patient Education  [employer].

## 2021-12-20 ENCOUNTER — Other Ambulatory Visit: Payer: Self-pay | Admitting: Family Medicine

## 2021-12-20 DIAGNOSIS — E041 Nontoxic single thyroid nodule: Secondary | ICD-10-CM

## 2021-12-24 ENCOUNTER — Telehealth (INDEPENDENT_AMBULATORY_CARE_PROVIDER_SITE_OTHER): Payer: Medicare Other | Admitting: Family Medicine

## 2021-12-24 ENCOUNTER — Encounter: Payer: Self-pay | Admitting: Family Medicine

## 2021-12-24 DIAGNOSIS — S060X0D Concussion without loss of consciousness, subsequent encounter: Secondary | ICD-10-CM

## 2021-12-24 NOTE — Progress Notes (Signed)
Chief Complaint  Patient presents with   Headache    Subjective: Patient is a 71 y.o. female here for headache. Due to COVID-19 pandemic, we are interacting via web portal for an electronic face-to-face visit. I verified patient's ID using 2 identifiers. Patient agreed to proceed with visit via this method. Patient is at home, I am at office. Patient and I are present for visit.   Patient sustained a fall follow-up with me in the clinic last week and was diagnosed with concussion.  Yesterday, she had a left-sided headache that radiated into her jaw.  There is no injury or change in activity.  She feels better today.  She has been compliant with the vitamin B2, CoQ10 and magnesium.  No vision changes, difficulty with speech, trouble swallowing, balance issues, or weakness.  Past Medical History:  Diagnosis Date   Abnormal myocardial perfusion study 07/22/2018   Acute pharyngitis 09/26/2017   Adiposity 09/26/2013   AKI (acute kidney injury) 01/24/2017   Arthrofibrosis of knee joint, right 10/11/2013   Asthma    Chronic back pain 08/26/2019   Edema, unspecified 09/26/2013   Essential hypertension    Dx 71 ys ago; normal BP 130/60 (with med compliance)   Exertional chest pain 07/22/2018   Facet arthropathy, lumbosacral 06/15/2015   Gastro-esophageal reflux disease without esophagitis 09/26/2013   Gonalgia 09/26/2013   Heart murmur 02/27/2016   History of cataracts    bilateral; s/p surgery   History of colon polyps    Hyperlipidemia 04/23/2020   Hypokalemia 09/26/2013   Intervertebral disc degeneration, 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   Left knee pain 04/06/2007   Left thyroid nodule 10/28/2017   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   Nonintractable episodic headache 07/27/2017   Nuclear sclerotic cataract of left eye 07/13/2014   OA (osteoarthritis) of knee    s/p left TKR 09/2010   OAB (overactive bladder) 07/19/2019   Obesity, Class II, BMI 35-39.9 08/26/2019   Positive TB test    Primary  osteoarthritis of right knee 07/19/2013   Pure hypercholesterolemia 04/06/2007   Annotation: reports a history, and that she was on a medication for it   RBBB (right bundle branch block) 07/22/2018   S/P total knee replacement, right 08/26/2016   Snoring 08/26/2019   STOP BANG 4   Syncope 01/24/2017   Transaminitis 01/24/2017   Xerostomia 10/28/2017    Objective: No conversational dyspnea Age appropriate judgment and insight Nml affect and mood  Assessment and Plan: Concussion without loss of consciousness, subsequent encounter  Seemingly improved? No red flag ss's. Cont Mg, B2, CoQ10. Tylenol prn.  If no improvement, will refer to the concussion clinic. The patient voiced understanding and agreement to the plan.  [place], DO 12/24/21  1:44 PM

## 2021-12-24 NOTE — Patient Instructions (Signed)
Fish oil 3 grams daily for 10 days then 2 grams daily  Vitamin D 4000 IU daily  CoQ10 200mg daily for headaches  Tart cherry extract any dose at night  To help improve COGNITIVE function: Using fish oil/omega 3 that is 1000 mg (or roughly 600 mg EPA/DHA), starting as soon as possible after concussion, take: 3 tabs THREE TIMES a day  for the first 3 days, then (you will smell a little, sorry) 3 tabs TWICE DAILY  for the next 3 days, then 3 tabs ONCE DAILY  for the next 10 days    To help reduce HEADACHES: Coenzyme Q10 160mg ONCE DAILY Riboflavin/Vitamin B2 400mg ONCE DAILY Magnesium oxide 400 mg ONE-TWO TIMES DAILY May stop after headaches are resolved.                                                                                               To help with INSOMNIA: Melatonin 3-5mg AT BEDTIME Tart cherry extract, any dose at night    Other medicines to help decrease inflammation Alpha Lipoic Acid 100mg TWICE DAILY Turmeric 500mg twice daily Iron 65mg elemental daily Vitamin D 4000 IU daily for 2 weeks then 2000 IU daily thereafter. 

## 2022-01-09 DIAGNOSIS — I10 Essential (primary) hypertension: Secondary | ICD-10-CM | POA: Diagnosis not present

## 2022-01-09 DIAGNOSIS — Z79899 Other long term (current) drug therapy: Secondary | ICD-10-CM | POA: Diagnosis not present

## 2022-01-09 DIAGNOSIS — G44319 Acute post-traumatic headache, not intractable: Secondary | ICD-10-CM | POA: Diagnosis not present

## 2022-01-09 DIAGNOSIS — K219 Gastro-esophageal reflux disease without esophagitis: Secondary | ICD-10-CM | POA: Diagnosis not present

## 2022-01-09 DIAGNOSIS — R519 Headache, unspecified: Secondary | ICD-10-CM | POA: Diagnosis not present

## 2022-01-09 DIAGNOSIS — I440 Atrioventricular block, first degree: Secondary | ICD-10-CM | POA: Diagnosis not present

## 2022-01-09 DIAGNOSIS — Z87891 Personal history of nicotine dependence: Secondary | ICD-10-CM | POA: Diagnosis not present

## 2022-01-10 ENCOUNTER — Ambulatory Visit (INDEPENDENT_AMBULATORY_CARE_PROVIDER_SITE_OTHER): Payer: Medicare Other | Admitting: Family Medicine

## 2022-01-10 ENCOUNTER — Encounter: Payer: Self-pay | Admitting: Family Medicine

## 2022-01-10 ENCOUNTER — Telehealth: Payer: Self-pay

## 2022-01-10 VITALS — BP 156/70 | HR 61 | Temp 98.0°F | Ht 62.0 in | Wt 196.2 lb

## 2022-01-10 DIAGNOSIS — S060X0D Concussion without loss of consciousness, subsequent encounter: Secondary | ICD-10-CM

## 2022-01-10 DIAGNOSIS — S46819D Strain of other muscles, fascia and tendons at shoulder and upper arm level, unspecified arm, subsequent encounter: Secondary | ICD-10-CM

## 2022-01-10 DIAGNOSIS — I10 Essential (primary) hypertension: Secondary | ICD-10-CM

## 2022-01-10 MED ORDER — METHOCARBAMOL 500 MG PO TABS: 500.0000 mg | ORAL_TABLET | Freq: Three times a day (TID) | ORAL | 0 refills | Status: DC | PRN

## 2022-01-10 MED ORDER — METHYLPREDNISOLONE ACETATE 40 MG/ML IJ SUSP
40.0000 mg | Freq: Once | INTRAMUSCULAR | Status: AC
  Administered 2022-01-10: 40 mg via INTRAMUSCULAR

## 2022-01-10 MED ORDER — OLMESARTAN-AMLODIPINE-HCTZ 40-5-25 MG PO TABS: 1.0000 | ORAL_TABLET | Freq: Every day | ORAL | 2 refills | Status: DC

## 2022-01-10 NOTE — Addendum Note (Signed)
Addended by: Sharon Seller B on: 01/10/2022 02:22 PM   Modules accepted: Orders

## 2022-01-10 NOTE — Patient Instructions (Addendum)
Keep the diet clean and stay active.  Check your blood pressures 2-3 times per week, alternating the time of day you check it. If it is high, considering waiting 1-2 minutes and rechecking. If it gets higher, your anxiety is likely creeping up and we should avoid rechecking.   Ice/cold pack over area for 10-15 min twice daily.  Let me know if there are cost issues with the medicine.   Heat (pad or rice pillow in microwave) over affected area, 10-15 minutes twice daily.   Let us know if you need anything.  Trapezius stretches/exercises Do exercises exactly as told by your health care provider and adjust them as directed. It is normal to feel mild stretching, pulling, tightness, or discomfort as you do these exercises, but you should stop right away if you feel sudden pain or your pain gets worse.   Stretching and range of motion exercises These exercises warm up your muscles and joints and improve the movement and flexibility of your shoulder. These exercises can also help to relieve pain, numbness, and tingling. If you are unable to do any of the following for any reason, do not further attempt to do it.   Exercise A: Flexion, standing     Stand and hold a broomstick, a cane, or a similar object. Place your hands a little more than shoulder-width apart on the object. Your left / right hand should be palm-up, and your other hand should be palm-down. Push the stick to raise your left / right arm out to your side and then over your head. Use your other hand to help move the stick. Stop when you feel a stretch in your shoulder, or when you reach the angle that is recommended by your health care provider. Avoid shrugging your shoulder while you raise your arm. Keep your shoulder blade tucked down toward your spine. Hold for 30 seconds. Slowly return to the starting position. Repeat 2 times. Complete this exercise 3 times per week.  Exercise B: Abduction, supine     Lie on your back and hold  a broomstick, a cane, or a similar object. Place your hands a little more than shoulder-width apart on the object. Your left / right hand should be palm-up, and your other hand should be palm-down. Push the stick to raise your left / right arm out to your side and then over your head. Use your other hand to help move the stick. Stop when you feel a stretch in your shoulder, or when you reach the angle that is recommended by your health care provider. Avoid shrugging your shoulder while you raise your arm. Keep your shoulder blade tucked down toward your spine. Hold for 30 seconds. Slowly return to the starting position. Repeat 2 times. Complete this exercise 3 times per week.  Exercise C: Flexion, active-assisted     Lie on your back. You may bend your knees for comfort. Hold a broomstick, a cane, or a similar object. Place your hands about shoulder-width apart on the object. Your palms should face toward your feet. Raise the stick and move your arms over your head and behind your head, toward the floor. Use your healthy arm to help your left / right arm move farther. Stop when you feel a gentle stretch in your shoulder, or when you reach the angle where your health care provider tells you to stop. Hold for 30 seconds. Slowly return to the starting position. Repeat 2 times. Complete this exercise 3 times per week.  Exercise D: External rotation and abduction     Stand in a door frame with one of your feet slightly in front of the other. This is called a staggered stance. Choose one of the following positions as told by your health care provider: Place your hands and forearms on the door frame above your head. Place your hands and forearms on the door frame at the height of your head. Place your hands on the door frame at the height of your elbows. Slowly move your weight onto your front foot until you feel a stretch across your chest and in the front of your shoulders. Keep your head and  chest upright and keep your abdominal muscles tight. Hold for 30 seconds. To release the stretch, shift your weight to your back foot. Repeat 2 times. Complete this stretch 3 times per week.  Strengthening exercises These exercises build strength and endurance in your shoulder. Endurance is the ability to use your muscles for a long time, even after your muscles get tired. Exercise E: Scapular depression and adduction  Sit on a stable chair. Support your arms in front of you with pillows, armrests, or a tabletop. Keep your elbows in line with the sides of your body. Gently move your shoulder blades down toward your middle back. Relax the muscles on the tops of your shoulders and in the back of your neck. Hold for 3 seconds. Slowly release the tension and relax your muscles completely before doing this exercise again. Repeat for a total of 10 repetitions. After you have practiced this exercise, try doing the exercise without the arm support. Then, try the exercise while standing instead of sitting. Repeat 2 times. Complete this exercise 3 times per week.  Exercise F: Shoulder abduction, isometric     Stand or sit about 4-6 inches (10-15 cm) from a wall with your left / right side facing the wall. Bend your left / right elbow and gently press your elbow against the wall. Increase the pressure slowly until you are pressing as hard as you can without shrugging your shoulder. Hold for 3 seconds. Slowly release the tension and relax your muscles completely. Repeat for a total of 10 repetitions. Repeat 2 times. Complete this exercise 3 times per week.  Exercise G: Shoulder flexion, isometric     Stand or sit about 4-6 inches (10-15 cm) away from a wall with your left / right side facing the wall. Keep your left / right elbow straight and gently press the top of your fist against the wall. Increase the pressure slowly until you are pressing as hard as you can without shrugging your  shoulder. Hold for 10-15 seconds. Slowly release the tension and relax your muscles completely. Repeat for a total of 10 repetitions. Repeat 2 times. Complete this exercise 3 times per week.  Exercise H: Internal rotation     Sit in a stable chair without armrests, or stand. Secure an exercise band at your left / right side, at elbow height. Place a soft object, such as a folded towel or a small pillow, under your left / right upper arm so your elbow is a few inches (about 8 cm) away from your side. Hold the end of the exercise band so the band stretches. Keeping your elbow pressed against the soft object under your arm, move your forearm across your body toward your abdomen. Keep your body steady so the movement is only coming from your shoulder. Hold for 3 seconds. Slowly return to  the starting position. Repeat for a total of 10 repetitions. Repeat 2 times. Complete this exercise 3 times per week.  Exercise I: External rotation     Sit in a stable chair without armrests, or stand. Secure an exercise band at your left / right side, at elbow height. Place a soft object, such as a folded towel or a small pillow, under your left / right upper arm so your elbow is a few inches (about 8 cm) away from your side. Hold the end of the exercise band so the band stretches. Keeping your elbow pressed against the soft object under your arm, move your forearm out, away from your abdomen. Keep your body steady so the movement is only coming from your shoulder. Hold for 3 seconds. Slowly return to the starting position. Repeat for a total of 10 repetitions. Repeat 2 times. Complete this exercise 3 times per week. Exercise J: Shoulder extension  Sit in a stable chair without armrests, or stand. Secure an exercise band to a stable object in front of you so the band is at shoulder height. Hold one end of the exercise band in each hand. Your palms should face each other. Straighten your elbows and lift  your hands up to shoulder height. Step back, away from the secured end of the exercise band, until the band stretches. Squeeze your shoulder blades together and pull your hands down to the sides of your thighs. Stop when your hands are straight down by your sides. Do not let your hands go behind your body. Hold for 3 seconds. Slowly return to the starting position. Repeat for a total of 10 repetitions. Repeat 2 times. Complete this exercise 3 times per week.  Exercise K: Shoulder extension, prone     Lie on your abdomen on a firm surface so your left / right arm hangs over the edge. Hold a 5 lb weight in your hand so your palm faces in toward your body. Your arm should be straight. Squeeze your shoulder blade down toward the middle of your back. Slowly raise your arm behind you, up to the height of the surface that you are lying on. Keep your arm straight. Hold for 3 seconds. Slowly return to the starting position and relax your muscles. Repeat for a total of 10 repetitions. Repeat 2 times. Complete this exercise 3 times per week.   Exercise L: Horizontal abduction, prone  Lie on your abdomen on a firm surface so your left / right arm hangs over the edge. Hold a 5 lb weight in your hand so your palm faces toward your feet. Your arm should be straight. Squeeze your shoulder blade down toward the middle of your back. Bend your elbow so your hand moves up, until your elbow is bent to an "L" shape (90 degrees). With your elbow bent, slowly move your forearm forward and up. Raise your hand up to the height of the surface that you are lying on. Your upper arm should not move, and your elbow should stay bent. At the top of the movement, your palm should face the floor. Hold for 3 seconds. Slowly return to the starting position and relax your muscles. Repeat for a total of 10 repetitions. Repeat 2 times. Complete this exercise 3 times per week.  Exercise M: Horizontal abduction, standing  Sit  on a stable chair, or stand. Secure an exercise band to a stable object in front of you so the band is at shoulder height. Hold one end  of the exercise band in each hand. Straighten your elbows and lift your hands straight in front of you, up to shoulder height. Your palms should face down, toward the floor. Step back, away from the secured end of the exercise band, until the band stretches. Move your arms out to your sides, and keep your arms straight. Hold for 3 seconds. Slowly return to the starting position. Repeat for a total of 10 repetitions. Repeat 2 times. Complete this exercise 3 times per week.  Exercise N: Scapular retraction and elevation  Sit on a stable chair, or stand. Secure an exercise band to a stable object in front of you so the band is at shoulder height. Hold one end of the exercise band in each hand. Your palms should face each other. Sit in a stable chair without armrests, or stand. Step back, away from the secured end of the exercise band, until the band stretches. Squeeze your shoulder blades together and lift your hands over your head. Keep your elbows straight. Hold for 3 seconds. Slowly return to the starting position. Repeat for a total of 10 repetitions. Repeat 2 times. Complete this exercise 3 times per week.  This information is not intended to replace advice given to you by your health care provider. Make sure you discuss any questions you have with your health care provider. Document Released: 03/10/2005 Document Revised: 11/15/2015 Document Reviewed: 01/25/2015 Elsevier Interactive Patient Education  2017 [employer].

## 2022-01-10 NOTE — Progress Notes (Signed)
No chief complaint on file.   Subjective: Patient is a 71 y.o. female here for headache.  Frontal headache for past 2.5 weeks after falling. Had a few neg CT heads. Tylenol was helping initially. Was rx'd Fiorcet but has not taken yet.   Hypertension Patient presents for hypertension follow up. She does not monitor home blood pressures. She is compliant with medications- Micardis 40 mg/d, Norvasc 5 mg/d, HCTZ 25 mg/d, Toprol XL 50 mg/d. Patient has these side effects of medication: none She is sometimes adhering to a healthy diet overall. Exercise: none since above inj  Past Medical History:  Diagnosis Date   Abnormal myocardial perfusion study 07/22/2018   Acute pharyngitis 09/26/2017   Adiposity 09/26/2013   AKI (acute kidney injury) 01/24/2017   Arthrofibrosis of knee joint, right 10/11/2013   Asthma    Chronic back pain 08/26/2019   Edema, unspecified 09/26/2013   Essential hypertension    Dx 85 ys ago; normal BP 130/60 (with med compliance)   Exertional chest pain 07/22/2018   Facet arthropathy, lumbosacral 06/15/2015   Gastro-esophageal reflux disease without esophagitis 09/26/2013   Gonalgia 09/26/2013   Heart murmur 02/27/2016   History of cataracts    bilateral; s/p surgery   History of colon polyps    Hyperlipidemia 04/23/2020   Hypokalemia 09/26/2013   Intervertebral disc degeneration, 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   Left knee pain 04/06/2007   Left thyroid nodule 10/28/2017   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   Nonintractable episodic headache 07/27/2017   Nuclear sclerotic cataract of left eye 07/13/2014   OA (osteoarthritis) of knee    s/p left TKR 09/2010   OAB (overactive bladder) 07/19/2019   Obesity, Class II, BMI 35-39.9 08/26/2019   Positive TB test    Primary osteoarthritis of right knee 07/19/2013   Pure hypercholesterolemia 04/06/2007   Annotation: reports a history, and that she was on a medication for it   RBBB (right bundle branch block) 07/22/2018    S/P total knee replacement, right 08/26/2016   Snoring 08/26/2019   STOP BANG 4   Syncope 01/24/2017   Transaminitis 01/24/2017   Xerostomia 10/28/2017    Objective: BP (!) 156/70 (BP Location: Left Arm, Cuff Size: Normal)   Pulse 61   Temp 98 F (36.7 C) (Oral)   Ht 5\' 2"  (1.575 m)   Wt 196 lb 4 oz (89 kg)   SpO2 96%   BMI 35.89 kg/m  General: Awake, appears stated age Heart: RRR, no LE edema Lungs: CTAB, no rales, wheezes or rhonchi. No accessory muscle use MSK: +TTP over traps b/l w hypertonicity Neuro: DTR's equal and symmetric throughout, 5/5 strength throughout, no cerebellar signs Psych: Age appropriate judgment and insight, normal affect and mood  Assessment and Plan: Concussion without loss of consciousness, subsequent encounter - Plan: Ambulatory referral to Sports Medicine  Essential hypertension  Strain of trapezius muscle, unspecified laterality, subsequent encounter - Plan: methocarbamol (ROBAXIN) 500 MG tablet  Refer concussion clinic. Will see if tx'ing 3 will help. Depo 80 mg IM today.  Chronic, uncontrolled. Stop Micardis, HCTZ, Norvasc, start Azor-HCT 40-5-25 mg/d. Cont Toprol XL 50 mg/d. Counseled on diet/exercise. Monitor BP at home. F/u in 1 mo. Stretches/exercises, heat, ice, Tylenol, Robaxin. PT if no better.  The patient voiced understanding and agreement to the plan.  [place], DO 01/10/22  2:17 PM

## 2022-01-10 NOTE — Telephone Encounter (Signed)
Nurse Assessment Nurse: Patsey Berthold, RN, [place] Date/Time (Eastern Time): 01/09/2022 5:26:39 PM Confirm and document reason for call. If symptomatic, describe symptoms. ---Caller states she has a headache and need to make an appt. Denies change or blurred vision. Reports fall 2 weeks prior with head injury, seen by MD post fall in E.D. Reports weakness to LEFT arm, not a new symptom and worsening symptom. Does the patient have any new or worsening symptoms? ---Yes Will a triage be completed? ---Yes Related visit to physician within the last 2 weeks? ---Yes Does the PT have any chronic conditions? (i.e. diabetes, asthma, this includes High risk factors for pregnancy, etc.) ---Yes List chronic conditions. ---HTN Cholesterol Is this a behavioral health or substance abuse call? ---No Guidelines Guideline Title Affirmed Question Affirmed Notes Nurse Date/Time [place]ilene Ghazi Time) Neurologic Deficit Headache (and neurologic deficit) Patsey Berthold, RN, [place] 01/09/2022 5:31:58 PM Disp. Time Eilene Ghazi Time) Disposition Final User 01/09/2022 5:35:57 PM Go to ED Now Yes Patsey Berthold, RN, [place] PLEASE NOTE: All timestamps contained within this report are represented as [place] Standard Time. CONFIDENTIALTY NOTICE: This fax transmission is intended only for the addressee. It contains information that is legally privileged, confidential or otherwise protected from use or disclosure. If you are not the intended recipient, you are strictly prohibited from reviewing, disclosing, copying using or disseminating any of this information or taking any action in reliance on or regarding this information. If you have received this fax in error, please notify us immediately by telephone so that we can arrange for its return to [place]. Phone: [phone], Toll-Free: [phone], Fax: [phone] Page: 2 of 2 Call Id: [id number] Final Disposition 01/09/2022 5:35:57 PM Go to ED Now Yes Patsey Berthold, RN, [place] Disagree/Comply  Comply Caller Understands Yes PreDisposition Did not know what to do Care Advice Given Per Guideline GO TO ED NOW: * You need to be seen in the Emergency Department. NOTE TO TRIAGER - DRIVING: * Another adult should drive. * Patient should not delay going to the emergency department. * If immediate transportation is not available via car, rideshare (e.g., Lyft, Uber), or taxi, then the patient should be instructed to call [id number]. CARE ADVICE given per Neurologic Deficit (Adult) guideline. Referrals GO TO FACILITY OTHER - SPECIFY

## 2022-01-10 NOTE — Telephone Encounter (Signed)
Called and scheduled at 2 today.

## 2022-01-13 ENCOUNTER — Ambulatory Visit: Payer: Medicare Other | Admitting: Family Medicine

## 2022-01-17 ENCOUNTER — Encounter: Payer: Medicare Other | Admitting: Family Medicine

## 2022-01-21 ENCOUNTER — Encounter: Payer: Self-pay | Admitting: Family Medicine

## 2022-01-21 DIAGNOSIS — E042 Nontoxic multinodular goiter: Secondary | ICD-10-CM | POA: Diagnosis not present

## 2022-01-25 ENCOUNTER — Encounter: Payer: Self-pay | Admitting: Family Medicine

## 2022-01-27 ENCOUNTER — Other Ambulatory Visit: Payer: Self-pay | Admitting: Family Medicine

## 2022-01-27 MED ORDER — BUTALBITAL-APAP-CAFFEINE 50-325-40 MG PO TABS: 1.0000 | ORAL_TABLET | Freq: Four times a day (QID) | ORAL | 0 refills | Status: DC | PRN

## 2022-01-29 ENCOUNTER — Other Ambulatory Visit: Payer: Self-pay | Admitting: Family Medicine

## 2022-02-10 ENCOUNTER — Ambulatory Visit (INDEPENDENT_AMBULATORY_CARE_PROVIDER_SITE_OTHER): Payer: Medicare Other | Admitting: Family Medicine

## 2022-02-10 ENCOUNTER — Encounter: Payer: Self-pay | Admitting: Family Medicine

## 2022-02-10 VITALS — BP 122/80 | HR 64 | Temp 98.1°F | Ht 62.0 in | Wt 196.5 lb

## 2022-02-10 DIAGNOSIS — I10 Essential (primary) hypertension: Secondary | ICD-10-CM

## 2022-02-10 DIAGNOSIS — S060X0D Concussion without loss of consciousness, subsequent encounter: Secondary | ICD-10-CM

## 2022-02-10 NOTE — Progress Notes (Signed)
Chief Complaint  Patient presents with   Follow-up    1 month Feeling better    Subjective ROSABELL Montgomery is a 71 y.o. female who presents for hypertension follow up. She does not monitor home blood pressures. She is compliant with medications- Toprol XL 50 mg/d, olmesartan-amlodipine-HCT 40-5-25 mg/d. Patient has these side effects of medication: none She is usually adhering to a healthy diet overall. Current exercise: active at work No Cp or SOB.   Patient reports around 75% improvement of her concussion symptoms since her last visit.  She is taking magnesium daily.  She has been using Fioricet to help with headaches.  Unfortunately, she has been using this on a daily basis.   Past Medical History:  Diagnosis Date   Abnormal myocardial perfusion study 07/22/2018   Acute pharyngitis 09/26/2017   Adiposity 09/26/2013   AKI (acute kidney injury) 01/24/2017   Arthrofibrosis of knee joint, right 10/11/2013   Asthma    Chronic back pain 08/26/2019   Edema, unspecified 09/26/2013   Essential hypertension    Dx 30 ys ago; normal BP 130/60 (with med compliance)   Exertional chest pain 07/22/2018   Facet arthropathy, lumbosacral 06/15/2015   Gastro-esophageal reflux disease without esophagitis 09/26/2013   Gonalgia 09/26/2013   Heart murmur 02/27/2016   History of cataracts    bilateral; s/p surgery   History of colon polyps    Hyperlipidemia 04/23/2020   Hypokalemia 09/26/2013   Intervertebral disc degeneration, 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   Left knee pain 04/06/2007   Left thyroid nodule 10/28/2017   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   Nonintractable episodic headache 07/27/2017   Nuclear sclerotic cataract of left eye 07/13/2014   OA (osteoarthritis) of knee    s/p left TKR 09/2010   OAB (overactive bladder) 07/19/2019   Obesity, Class II, BMI 35-39.9 08/26/2019   Positive TB test    Primary osteoarthritis of right knee 07/19/2013   Pure hypercholesterolemia 04/06/2007    Annotation: reports a history, and that she was on a medication for it   RBBB (right bundle branch block) 07/22/2018   S/P total knee replacement, right 08/26/2016   Snoring 08/26/2019   STOP BANG 4   Syncope 01/24/2017   Transaminitis 01/24/2017   Xerostomia 10/28/2017    Exam BP 122/80 (BP Location: Left Arm, Patient Position: Sitting, Cuff Size: Normal)   Pulse 64   Temp 98.1 F (36.7 C) (Oral)   Ht 5\' 2"  (1.575 m)   Wt 196 lb 8 oz (89.1 kg)   SpO2 97%   BMI 35.94 kg/m  General:  well developed, well nourished, in no apparent distress Heart: RRR, no bruits, no LE edema Lungs: clear to auscultation, no accessory muscle use Neuro: Gait is normal, speech is fluent and goal oriented Psych: well oriented with normal range of affect and appropriate judgment/insight  Essential hypertension  Concussion without loss of consciousness, subsequent encounter  Chronic, stable.  Continue Toprol-XL 50 mg daily, olmesartan-amlodipine-hydrochlorothiazide 40-5-25 mg daily.  Counseled on diet and exercise. This seems to be improving.  Would consider referral to the concussion clinic again if she fails to improve.  Continue magnesium daily, try to transition from Fioricet as needed to Excedrin or Tylenol. F/u as originally scheduled.. The patient voiced understanding and agreement to the plan.  11-27-1986 [place], DO 02/10/22  7:57 AM

## 2022-02-10 NOTE — Patient Instructions (Signed)
Try to transition to Excedrin Migraine medicine or Tylenol for your morning headaches.  Continue magnesium until they have resolved.  Keep the diet clean and stay active.  Aim to do some physical exertion for 150 minutes per week. This is typically divided into 5 days per week, 30 minutes per day. The activity should be enough to get your heart rate up. Anything is better than nothing if you have time constraints.  Let us know if you need anything.

## 2022-02-14 ENCOUNTER — Encounter: Payer: Self-pay | Admitting: Family Medicine

## 2022-02-23 ENCOUNTER — Encounter: Payer: Self-pay | Admitting: Family Medicine

## 2022-02-26 ENCOUNTER — Telehealth: Payer: Self-pay | Admitting: Family Medicine

## 2022-02-26 DIAGNOSIS — S060X0D Concussion without loss of consciousness, subsequent encounter: Secondary | ICD-10-CM

## 2022-02-26 NOTE — Telephone Encounter (Signed)
Pt called stating that her Olmesartan-Amlodipine-Htcz is increasing in price to the point where she can't afford it and was wanting to look into a more affordable medication. Please Advise.

## 2022-02-26 NOTE — Telephone Encounter (Signed)
If she had enough medicine left, could she call her ins to see how much Azor would be? That combines 2 of the medicines. Otherwise I can just separate them.

## 2022-02-26 NOTE — Telephone Encounter (Signed)
Called left message to call back 

## 2022-02-28 NOTE — Telephone Encounter (Signed)
Called left message to call back 

## 2022-02-28 NOTE — Telephone Encounter (Signed)
Called informed the patient of PCP response/request. She will call back the insurance Co. And call back with their response.

## 2022-03-01 ENCOUNTER — Other Ambulatory Visit: Payer: Self-pay | Admitting: Family Medicine

## 2022-03-05 NOTE — Telephone Encounter (Signed)
The patient stated that she is having Headaches again and wanted PCP to know. Not sure what else to do.

## 2022-03-05 NOTE — Telephone Encounter (Signed)
Called the patient and she agreed to be referred to the concussion clinic. I could not find just [facility] to order this referral.

## 2022-03-05 NOTE — Telephone Encounter (Signed)
We can have her go to the concussion clinic as initially planned.

## 2022-03-06 NOTE — Telephone Encounter (Signed)
Sports med referral placed.

## 2022-03-10 ENCOUNTER — Ambulatory Visit: Payer: Medicare Other | Admitting: Family Medicine

## 2022-03-11 ENCOUNTER — Other Ambulatory Visit: Payer: Self-pay | Admitting: Family Medicine

## 2022-03-11 ENCOUNTER — Encounter: Payer: Self-pay | Admitting: Family Medicine

## 2022-03-11 ENCOUNTER — Ambulatory Visit: Payer: Medicare Other | Admitting: Family Medicine

## 2022-03-11 VITALS — BP 140/88 | Ht 62.0 in | Wt 198.0 lb

## 2022-03-11 DIAGNOSIS — S161XXA Strain of muscle, fascia and tendon at neck level, initial encounter: Secondary | ICD-10-CM | POA: Insufficient documentation

## 2022-03-11 DIAGNOSIS — G44309 Post-traumatic headache, unspecified, not intractable: Secondary | ICD-10-CM

## 2022-03-11 HISTORY — DX: Post-traumatic headache, unspecified, not intractable: G44.309

## 2022-03-11 HISTORY — DX: Strain of muscle, fascia and tendon at neck level, initial encounter: S16.1XXA

## 2022-03-11 MED ORDER — NORTRIPTYLINE HCL 10 MG PO CAPS: 10.0000 mg | ORAL_CAPSULE | Freq: Every day | ORAL | 1 refills | Status: DC

## 2022-03-11 NOTE — Progress Notes (Signed)
Cynthia Montgomery - 71 y.o. female MRN [id number]  Date of birth: 05/28/1950  SUBJECTIVE:  Including CC & ROS.  No chief complaint on file.   Cynthia Montgomery is a 71 y.o. female that is presenting with ongoing headache following an accident she had in September.  Her initial injury was around 9/26.  She fell forward on the concrete sidewalk and hit her head.  Since that time she has had ongoing headache.  No history of previous concussion.  No history of surgery around the neck chest or head.  She had her glasses checked roughly a month ago.  Her headache can come at all times of the day.  It seems to be exacerbated with the more that she does..  Review of the CT cervical spine from 9/26 shows cervical spondylosis. Review of the CT facial bones from 9/26 shows no abnormal changes. Review of the CT head from 9/26 shows no acute changes.  Review of Systems See HPI   HISTORY: Past Medical, Surgical, Social, and Family History Reviewed & Updated per EMR.   Pertinent Historical Findings include:  Past Medical History:  Diagnosis Date   Abnormal myocardial perfusion study 07/22/2018   Acute pharyngitis 09/26/2017   Adiposity 09/26/2013   AKI (acute kidney injury) 01/24/2017   Arthrofibrosis of knee joint, right 10/11/2013   Asthma    Chronic back pain 08/26/2019   Edema, unspecified 09/26/2013   Essential hypertension    Dx 30 ys ago; normal BP 130/60 (with med compliance)   Exertional chest pain 07/22/2018   Facet arthropathy, lumbosacral 06/15/2015   Gastro-esophageal reflux disease without esophagitis 09/26/2013   Gonalgia 09/26/2013   Heart murmur 02/27/2016   History of cataracts    bilateral; s/p surgery   History of colon polyps    Hyperlipidemia 04/23/2020   Hypokalemia 09/26/2013   Intervertebral disc degeneration, 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   Left knee pain 04/06/2007   Left thyroid nodule 10/28/2017   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   Nonintractable episodic headache  07/27/2017   Nuclear sclerotic cataract of left eye 07/13/2014   OA (osteoarthritis) of knee    s/p left TKR 09/2010   OAB (overactive bladder) 07/19/2019   Obesity, Class II, BMI 35-39.9 08/26/2019   Positive TB test    Primary osteoarthritis of right knee 07/19/2013   Pure hypercholesterolemia 04/06/2007   Annotation: reports a history, and that she was on a medication for it   RBBB (right bundle branch block) 07/22/2018   S/P total knee replacement, right 08/26/2016   Snoring 08/26/2019   STOP BANG 4   Syncope 01/24/2017   Transaminitis 01/24/2017   Xerostomia 10/28/2017    Past Surgical History:  Procedure Laterality Date   CATARACT EXTRACTION, BILATERAL  2018   KNEE SURGERY     states 2 surgeries on each knee around 2016 and 2017   TUBAL LIGATION       PHYSICAL EXAM:  VS: BP (!) 140/88   Ht 5\' 2"  (1.575 m)   Wt 198 lb (89.8 kg)   BMI 36.21 kg/m  Physical Exam Gen: NAD, alert, cooperative with exam, well-appearing MSK:  Neurovascularly intact       ASSESSMENT & PLAN:   Cervical strain Acute on chronic in nature.  Continues to have limited range of motion and pain with exam today.   -Counseled on home exercise therapy and supportive care. -Refer to physical therapy. -Could consider trigger point injections or further imaging.  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  Having trouble with lateral head rotation and vertical head rotation on exam testing.  Had normal saccade testing -Counseled on home exercise therapy and supportive care. -Referral to physical therapy. -Initiate nortriptyline.  Counseled on not initiating if she is still taking Celexa. -Could consider topiramate.

## 2022-03-11 NOTE — Assessment & Plan Note (Signed)
Acute on chronic in nature.  Continues to have limited range of motion and pain with exam today.   -Counseled on home exercise therapy and supportive care. -Refer to physical therapy. -Could consider trigger point injections or further imaging.

## 2022-03-11 NOTE — Assessment & Plan Note (Signed)
Acute on chronic in nature with her initial injury around 9/26.  Having trouble with lateral head rotation and vertical head rotation on exam testing.  Had normal saccade testing -Counseled on home exercise therapy and supportive care. -Referral to physical therapy. -Initiate nortriptyline.  Counseled on not initiating if she is still taking Celexa. -Could consider topiramate.

## 2022-03-11 NOTE — Patient Instructions (Signed)
Good to see you Please try heat on the neck  Please try the exercises  Please make sure you're not taking celexa before starting the nortriptyline.  We have made a referral to physical therapy  Please send me a message in MyChart with any questions or updates.  Please see me back in 4 weeks.   --Dr. Jordan Likes

## 2022-03-20 ENCOUNTER — Telehealth: Payer: Self-pay | Admitting: Family Medicine

## 2022-03-26 ENCOUNTER — Other Ambulatory Visit: Payer: Self-pay | Admitting: Family Medicine

## 2022-03-26 DIAGNOSIS — F411 Generalized anxiety disorder: Secondary | ICD-10-CM

## 2022-03-26 MED ORDER — CITALOPRAM HYDROBROMIDE 20 MG PO TABS: 20.0000 mg | ORAL_TABLET | Freq: Every day | ORAL | 3 refills | Status: DC

## 2022-03-26 NOTE — Telephone Encounter (Signed)
Called the patient left detailed message medication sent to pharmacy.

## 2022-03-26 NOTE — Telephone Encounter (Signed)
Citalopram is not on her current list

## 2022-03-26 NOTE — Telephone Encounter (Signed)
Patient would like a call back to discuss why the citalopram was denied a refill. Please advise.

## 2022-03-31 ENCOUNTER — Other Ambulatory Visit: Payer: Self-pay | Admitting: Family Medicine

## 2022-03-31 DIAGNOSIS — K219 Gastro-esophageal reflux disease without esophagitis: Secondary | ICD-10-CM

## 2022-04-03 ENCOUNTER — Other Ambulatory Visit: Payer: Self-pay | Admitting: Family Medicine

## 2022-04-03 DIAGNOSIS — K219 Gastro-esophageal reflux disease without esophagitis: Secondary | ICD-10-CM

## 2022-04-07 ENCOUNTER — Ambulatory Visit: Payer: Medicare HMO | Admitting: Family Medicine

## 2022-04-07 ENCOUNTER — Encounter: Payer: Self-pay | Admitting: Family Medicine

## 2022-04-07 ENCOUNTER — Ambulatory Visit (INDEPENDENT_AMBULATORY_CARE_PROVIDER_SITE_OTHER): Payer: Medicare HMO | Admitting: Family Medicine

## 2022-04-07 VITALS — BP 130/80 | HR 81 | Temp 98.0°F | Ht 62.0 in | Wt 194.0 lb

## 2022-04-07 DIAGNOSIS — M6281 Muscle weakness (generalized): Secondary | ICD-10-CM | POA: Diagnosis not present

## 2022-04-07 DIAGNOSIS — M25512 Pain in left shoulder: Secondary | ICD-10-CM | POA: Diagnosis not present

## 2022-04-07 DIAGNOSIS — M542 Cervicalgia: Secondary | ICD-10-CM | POA: Diagnosis not present

## 2022-04-07 DIAGNOSIS — N3281 Overactive bladder: Secondary | ICD-10-CM

## 2022-04-07 DIAGNOSIS — I10 Essential (primary) hypertension: Secondary | ICD-10-CM

## 2022-04-07 DIAGNOSIS — E785 Hyperlipidemia, unspecified: Secondary | ICD-10-CM

## 2022-04-07 DIAGNOSIS — M25511 Pain in right shoulder: Secondary | ICD-10-CM | POA: Diagnosis not present

## 2022-04-07 MED ORDER — OLMESARTAN-AMLODIPINE-HCTZ 40-5-25 MG PO TABS: 1.0000 | ORAL_TABLET | Freq: Every day | ORAL | 11 refills | Status: DC

## 2022-04-07 MED ORDER — OXYBUTYNIN CHLORIDE ER 15 MG PO TB24: 15.0000 mg | ORAL_TABLET | Freq: Every day | ORAL | 3 refills | Status: DC

## 2022-04-07 NOTE — Patient Instructions (Addendum)
Keep the diet clean and stay active.  Make sure your bowels are moving well as this can cause frequent urination.   Let us know if you need anything.

## 2022-04-07 NOTE — Progress Notes (Signed)
Chief Complaint  Patient presents with   Follow-up    6 month     Subjective Cynthia Montgomery is a 72 y.o. female who presents for hypertension follow up. She does not monitor home blood pressures. She is compliant with medications- olmesartan-amlodipine-hctz 40-5-25 mg/d. Patient has these side effects of medication: none She is sometimes adhering to a healthy diet overall. Current exercise: none No CP or SOB.  Hyperlipidemia Patient presents for hyperlipidemia follow up. Currently being treated with Crestor 10 mg/d and compliance with treatment thus far has been good. She denies myalgias. Diet/exercise as above.  The patient is not known to have coexisting coronary artery disease.  Overactive bladder Chronic, seems to be getting worse.  Taking Ditropan XL 10 mg daily.  Reports compliance and no adverse effects.  She does not consume an abundance of alcohol or caffeine.  There is no pain, bleeding, or drainage.  She will sometimes have constipation issues however.  She has not correlated it with worsening frequency.  Past Medical History:  Diagnosis Date   Abnormal myocardial perfusion study 07/22/2018   Acute pharyngitis 09/26/2017   Adiposity 09/26/2013   AKI (acute kidney injury) 01/24/2017   Arthrofibrosis of knee joint, right 10/11/2013   Asthma    Chronic back pain 08/26/2019   Edema, unspecified 09/26/2013   Essential hypertension    Dx 96 ys ago; normal BP 130/60 (with med compliance)   Exertional chest pain 07/22/2018   Facet arthropathy, lumbosacral 06/15/2015   Gastro-esophageal reflux disease without esophagitis 09/26/2013   Gonalgia 09/26/2013   Heart murmur 02/27/2016   History of cataracts    bilateral; s/p surgery   History of colon polyps    Hyperlipidemia 04/23/2020   Hypokalemia 09/26/2013   Intervertebral disc degeneration, 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   Left knee pain 04/06/2007   Left thyroid nodule 10/28/2017   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    Nonintractable episodic headache 07/27/2017   Nuclear sclerotic cataract of left eye 07/13/2014   OA (osteoarthritis) of knee    s/p left TKR 09/2010   OAB (overactive bladder) 07/19/2019   Obesity, Class II, BMI 35-39.9 08/26/2019   Positive TB test    Primary osteoarthritis of right knee 07/19/2013   Pure hypercholesterolemia 04/06/2007   Annotation: reports a history, and that she was on a medication for it   RBBB (right bundle branch block) 07/22/2018   S/P total knee replacement, right 08/26/2016   Snoring 08/26/2019   STOP BANG 4   Syncope 01/24/2017   Transaminitis 01/24/2017   Xerostomia 10/28/2017    Exam BP 130/80 (BP Location: Left Arm, Patient Position: Sitting, Cuff Size: Normal)   Pulse 81   Temp 98 F (36.7 C) (Oral)   Ht 5\' 2"  (1.575 m)   Wt 194 lb (88 kg)   SpO2 97%   BMI 35.48 kg/m  General:  well developed, well nourished, in no apparent distress Heart: RRR, no bruits, no LE edema Lungs: clear to auscultation, no accessory muscle use Abdomen: Bowel sounds present, soft, nontender, nondistended Psych: well oriented with normal range of affect and appropriate judgment/insight  Essential hypertension  Hyperlipidemia, unspecified hyperlipidemia type  OAB (overactive bladder) - Plan: oxybutynin (DITROPAN XL) 15 MG 24 hr tablet  Chronic, stable.  Continue olmesartan-amlodipine-hydrochlorothiazide 40 - 5 - 25 mg daily.  Counseled on diet and exercise. Chronic, stable.  Declined labs today, will check next visit in 6 months.  Continue Crestor 10 mg daily. Increase Ditropan from 10 mg daily to 15  mg daily.  Make sure bowels are flowing well. F/u in 6 mo. The patient voiced understanding and agreement to the plan.  [place], DO 04/07/22  11:56 AM

## 2022-04-08 ENCOUNTER — Ambulatory Visit: Payer: Medicare HMO | Admitting: Family Medicine

## 2022-04-15 ENCOUNTER — Ambulatory Visit: Payer: Medicare HMO | Admitting: Family Medicine

## 2022-04-18 ENCOUNTER — Ambulatory Visit (INDEPENDENT_AMBULATORY_CARE_PROVIDER_SITE_OTHER): Payer: Medicare HMO | Admitting: *Deleted

## 2022-04-18 DIAGNOSIS — Z780 Asymptomatic menopausal state: Secondary | ICD-10-CM

## 2022-04-18 DIAGNOSIS — Z0000 Encounter for general adult medical examination without abnormal findings: Secondary | ICD-10-CM

## 2022-04-18 NOTE — Progress Notes (Signed)
Subjective:   Cynthia Montgomery is a 72 y.o. female who presents for Medicare Annual (Subsequent) preventive examination.  I connected with  [name] on 04/18/22 by a audio enabled telemedicine application and verified that I am speaking with the correct person using two identifiers.  Patient Location: Home  Provider Location: Office/Clinic  I discussed the limitations of evaluation and management by telemedicine. The patient expressed understanding and agreed to proceed.   Review of Systems    Defer to PCP Cardiac Risk Factors include: advanced age (>70men, >51 women);dyslipidemia;hypertension     Objective:    There were no vitals filed for this visit. There is no height or weight on file to calculate BMI.     04/18/2022    3:41 PM 04/21/2021    9:11 AM 03/26/2021    3:17 PM 05/02/2020    8:52 AM 12/21/2018    8:57 AM 12/14/2017    1:13 PM 09/26/2017   12:37 AM  Advanced Directives  Does Patient Have a Medical Advance Directive? No No No No No No No  Would patient like information on creating a medical advance directive? No - Patient declined  No - Patient declined  No - Patient declined No - Patient declined No - Patient declined    Current Medications (verified) Outpatient Encounter Medications as of 04/18/2022  Medication Sig   albuterol (VENTOLIN HFA) 108 (90 Base) MCG/ACT inhaler Inhale 2 puffs into the lungs every 6 (six) hours as needed for wheezing or shortness of breath.   aspirin EC 81 MG tablet Take 81 mg by mouth daily.   butalbital-acetaminophen-caffeine (FIORICET) 50-325-40 MG tablet Take 1 tablet by mouth every 6 (six) hours as needed for headache.   cholecalciferol (VITAMIN D) 400 units TABS tablet Take 400 Units by mouth daily.   citalopram (CELEXA) 20 MG tablet Take 1 tablet (20 mg total) by mouth daily.   fluticasone (FLONASE) 50 MCG/ACT nasal spray Place 2 sprays into both nostrils daily.   hydrOXYzine (ATARAX) 25 MG tablet Take 0.5 tablets (12.5 mg  total) by mouth 3 (three) times daily as needed for anxiety.   methocarbamol (ROBAXIN) 500 MG tablet Take 1 tablet (500 mg total) by mouth every 8 (eight) hours as needed for muscle spasms.   metoprolol succinate (TOPROL-XL) 50 MG 24 hr tablet Take 1 tablet (50 mg total) by mouth daily. TAKE 1 TABLET BY MOUTH ONCE DAILY WITH  OR  IMMEDIATELY  FOLLOWING  A  MEAL   Multiple Vitamin (MULTIVITAMIN) tablet Take 1 tablet by mouth daily.   nitroGLYCERIN (NITROSTAT) 0.4 MG SL tablet DISSOLVE ONE TABLET UNDER THE TONGUE EVERY 5 MINUTES AS NEEDED FOR CHEST PAIN.  DO NOT EXCEED A TOTAL OF 3 DOSES IN 15 MINUTES   nortriptyline (PAMELOR) 10 MG capsule Take 1 capsule (10 mg total) by mouth at bedtime.   Olmesartan-amLODIPine-HCTZ 40-5-25 MG TABS Take 1 tablet by mouth daily.   oxybutynin (DITROPAN XL) 15 MG 24 hr tablet Take 1 tablet (15 mg total) by mouth at bedtime.   pantoprazole (PROTONIX) 40 MG tablet Take 1 tablet by mouth once daily   rosuvastatin (CRESTOR) 10 MG tablet Take 1 tablet (10 mg total) by mouth daily.   No facility-administered encounter medications on file as of 04/18/2022.    Allergies (verified) Patient has no known allergies.   History: Past Medical History:  Diagnosis Date   Abnormal myocardial perfusion study 07/22/2018   Acute pharyngitis 09/26/2017   Adiposity 09/26/2013   AKI (  acute kidney injury) 01/24/2017   Arthrofibrosis of knee joint, right 10/11/2013   Asthma    Chronic back pain 08/26/2019   Edema, unspecified 09/26/2013   Essential hypertension    Dx 30 ys ago; normal BP 130/60 (with med compliance)   Exertional chest pain 07/22/2018   Facet arthropathy, lumbosacral 06/15/2015   Gastro-esophageal reflux disease without esophagitis 09/26/2013   Gonalgia 09/26/2013   Heart murmur 02/27/2016   History of cataracts    bilateral; s/p surgery   History of colon polyps    Hyperlipidemia 04/23/2020   Hypokalemia 09/26/2013   Intervertebral disc degeneration,  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   Left knee pain 04/06/2007   Left thyroid nodule 10/28/2017   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   Nonintractable episodic headache 07/27/2017   Nuclear sclerotic cataract of left eye 07/13/2014   OA (osteoarthritis) of knee    s/p left TKR 09/2010   OAB (overactive bladder) 07/19/2019   Obesity, Class II, BMI 35-39.9 08/26/2019   Positive TB test    Primary osteoarthritis of right knee 07/19/2013   Pure hypercholesterolemia 04/06/2007   Annotation: reports a history, and that she was on a medication for it   RBBB (right bundle branch block) 07/22/2018   S/P total knee replacement, right 08/26/2016   Snoring 08/26/2019   STOP BANG 4   Syncope 01/24/2017   Transaminitis 01/24/2017   Xerostomia 10/28/2017   Past Surgical History:  Procedure Laterality Date   CATARACT EXTRACTION, BILATERAL  2018   KNEE SURGERY     states 2 surgeries on each knee around 2016 and 2017   TUBAL LIGATION     Family History  Problem Relation Age of Onset   Arthritis Mother    Hypertension Mother    Memory loss Mother    Cancer Father    Pancreatic cancer Father    Diabetes Sister    Hypertension Sister    Memory loss Sister    Arthritis Daughter    Asthma Daughter    Kidney disease Daughter    Memory loss Maternal Aunt    Colon cancer Neg Hx    Colon polyps Neg Hx    Esophageal cancer Neg Hx    Stomach cancer Neg Hx    Rectal cancer Neg Hx    Social History   Socioeconomic History   Marital status: Married    Spouse name: Not on file   Number of children: Not on file   Years of education: 12   Highest education level: High school graduate  Occupational History   Occupation: Retired  Tobacco Use   Smoking status: Former    Types: Cigarettes   Smokeless tobacco: Never   Tobacco comments:    quit 30 years ago, 1980  Vaping Use   Vaping Use: Never used  Substance and Sexual Activity   Alcohol use: No   Drug use: No   Sexual activity: Yes    Birth  control/protection: None  Other Topics Concern   Not on file  Social History Narrative   Not on file   Social Determinants of Health   Financial Resource Strain: Low Risk  (03/26/2021)   Overall Financial Resource Strain (CARDIA)    Difficulty of Paying Living Expenses: Not hard at all  Food Insecurity: No Food Insecurity (04/18/2022)   Hunger Vital Sign    Worried About Running Out of Food in the Last Year: Never true    Ran Out of Food in the Last Year: Never true  Transportation Needs:  No Transportation Needs (04/18/2022)   PRAPARE - Hydrologist (Medical): No    Lack of Transportation (Non-Medical): No  Physical Activity: Inactive (03/26/2021)   Exercise Vital Sign    Days of Exercise per Week: 0 days    Minutes of Exercise per Session: 0 min  Stress: No Stress Concern Present (03/26/2021)   [place]    Feeling of Stress : Not at all  Social Connections: Moderately Isolated (03/26/2021)   Social Connection and Isolation Panel [NHANES]    Frequency of Communication with Friends and Family: More than three times a week    Frequency of Social Gatherings with Friends and Family: More than three times a week    Attends Religious Services: More than 4 times per year    Active Member of [employer] or Organizations: No    Attends Archivist Meetings: Never    Marital Status: Separated    Tobacco Counseling Counseling given: Not Answered Tobacco comments: quit 30 years ago, 1980   Clinical Intake:  Pre-visit preparation completed: Yes  Pain : No/denies pain  Diabetes: No  How often do you need to have someone help you when you read instructions, pamphlets, or other written materials from your doctor or pharmacy?: 1 - Never  Activities of Daily Living    04/18/2022    3:44 PM  In your present state of health, do you have any difficulty performing the following activities:   Hearing? 0  Vision? 0  Difficulty concentrating or making decisions? 1  Comment sometimes  Walking or climbing stairs? 1  Dressing or bathing? 0  Doing errands, shopping? 0  Preparing Food and eating ? N  Using the Toilet? N  In the past six months, have you accidently leaked urine? Y  Comment when coughing  Do you have problems with loss of bowel control? N  Managing your Medications? N  Managing your Finances? N  Housekeeping or managing your Housekeeping? N    Patient Care Team: Shelda Pal, DO as PCP - General (Family Medicine) Bettina Gavia Hilton Cork, MD as PCP - Cardiology (Cardiology)  Indicate any recent Medical Services you may have received from other than [facility] providers in the past year (date may be approximate).     Assessment:   This is a routine wellness examination for Loraine.  Hearing/Vision screen No results found.  Dietary issues and exercise activities discussed: Current Exercise Habits: The patient does not participate in regular exercise at present, Exercise limited by: orthopedic condition(s)   Goals Addressed   None    Depression Screen    04/18/2022    3:43 PM 10/28/2021    8:08 AM 03/26/2021    3:16 PM 10/22/2020    8:54 AM 01/23/2020    7:04 AM 12/21/2018    8:57 AM 12/14/2017    1:14 PM  PHQ 2/9 Scores  PHQ - 2 Score 1 0 0 0 0 0 0  PHQ- 9 Score  0         Fall Risk    04/18/2022    3:41 PM 04/07/2022   11:53 AM 10/28/2021    8:08 AM 03/26/2021    3:18 PM 10/22/2020    8:54 AM  Fall Risk   Falls in the past year? 1 1 0 1 0  Number falls in past yr: 0 0 0 1 0  Injury with Fall? 1 1 0 1 0  Comment  fell on ice and bruised right thigh   Risk for fall due to : History of fall(s) History of fall(s) No Fall Risks Impaired vision No Fall Risks  Follow up Falls evaluation completed  Falls evaluation completed Falls prevention discussed Falls evaluation completed    [place]:  Any stairs in or around the  home? Yes  If so, are there any without handrails? No  Home free of loose throw rugs in walkways, pet beds, electrical cords, etc? Yes  Adequate lighting in your home to reduce risk of falls? Yes   ASSISTIVE DEVICES UTILIZED TO PREVENT FALLS:  Life alert? No  Use of a cane, walker or w/c? No  Grab bars in the bathroom? Yes  Shower chair or bench in shower? Yes  Elevated toilet seat or a handicapped toilet? No   TIMED UP AND GO:  Was the test performed?  No, audio visit .    Cognitive Function:        04/18/2022    3:50 PM 03/26/2021    3:20 PM  6CIT Screen  What Year? 0 points 0 points  What month? 0 points 0 points  What time? 0 points 0 points  Count back from 20 0 points 0 points  Months in reverse 0 points 0 points  Repeat phrase 2 points 0 points  Total Score 2 points 0 points    Immunizations Immunization History  Administered Date(s) Administered   Fluad Quad(high Dose 65+) 12/21/2018, 12/10/2020, 12/19/2021   Influenza, High Dose Seasonal PF 12/14/2017, 01/16/2020   PFIZER(Purple Top)SARS-COV-2 Vaccination 04/18/2019, 05/09/2019, 01/16/2020, 08/13/2020   Pfizer Covid-19 Vaccine Bivalent Booster 44yrs & up 12/10/2020   Pneumococcal Conjugate-13 03/06/2017   Pneumococcal Polysaccharide-23 12/31/2015, 12/24/2018   Tdap 04/03/2015   Zoster Recombinat (Shingrix) 06/30/2020, 10/03/2020    TDAP status: Up to date  Flu Vaccine status: Up to date  Pneumococcal vaccine status: Up to date  Covid-19 vaccine status: Declined, Education has been provided regarding the importance of this vaccine but patient still declined. Advised may receive this vaccine at local pharmacy or Health Dept.or vaccine clinic. Aware to provide a copy of the vaccination record if obtained from local pharmacy or Health Dept. Verbalized acceptance and understanding.  Qualifies for Shingles Vaccine? Yes   Zostavax completed No   Shingrix Completed?: Yes  Screening Tests Health Maintenance   Topic Date Due   Medicare Annual Wellness (AWV)  03/26/2022   COVID-19 Vaccine (6 - 2023-24 season) 10/06/2022 (Originally 11/22/2021)   MAMMOGRAM  05/21/2023   DTaP/Tdap/Td (2 - Td or Tdap) 04/02/2025   COLONOSCOPY (Pts 45-69yrs Insurance coverage will need to be confirmed)  07/12/2030   Pneumonia Vaccine 68+ Years old  Completed   INFLUENZA VACCINE  Completed   DEXA SCAN  Completed   Hepatitis C Screening  Completed   Zoster Vaccines- Shingrix  Completed   HPV VACCINES  Aged Out    Health Maintenance  Health Maintenance Due  Topic Date Due   Medicare Annual Wellness (AWV)  03/26/2022    Colorectal cancer screening: Type of screening: Colonoscopy. Completed 07/11/20. Repeat every 10 years  Mammogram status: Completed 05/20/21. Repeat every year  Bone Density status: Ordered 04/18/22. Pt provided with contact info and advised to call to schedule appt.  Lung Cancer Screening: (Low Dose CT Chest recommended if Age 72-80 years, 30 pack-year currently smoking OR have quit w/in 15years.) does not qualify.   Additional Screening:  Hepatitis C Screening: does qualify; Completed  09/16/18  Vision Screening: Recommended annual ophthalmology exams for early detection of glaucoma and other disorders of the eye. Is the patient up to date with their annual eye exam?  Yes  Who is the provider or what is the name of the office in which the patient attends annual eye exams? Dr. Lillia Abed at [facility] If pt is not established with a provider, would they like to be referred to a provider to establish care? No .   Dental Screening: Recommended annual dental exams for proper oral hygiene  Community Resource Referral / Chronic Care Management: CRR required this visit?  No   CCM required this visit?  No      Plan:     I have personally reviewed and noted the following in the patient's chart:   Medical and social history Use of alcohol, tobacco or illicit drugs  Current medications and  supplements including opioid prescriptions. Patient is not currently taking opioid prescriptions. Functional ability and status Nutritional status Physical activity Advanced directives List of other physicians Hospitalizations, surgeries, and ER visits in previous 12 months Vitals Screenings to include cognitive, depression, and falls Referrals and appointments  In addition, I have reviewed and discussed with patient certain preventive protocols, quality metrics, and best practice recommendations. A written personalized care plan for preventive services as well as general preventive health recommendations were provided to patient.   Due to this being a telephonic visit, the after visit summary with patients personalized plan was offered to patient via mail or my-chart. Patient would like to access on my-chart.   Donne Anon, [place]   04/18/2022   Nurse Notes: None

## 2022-04-18 NOTE — Patient Instructions (Signed)
Ms. Rone , Thank you for taking time to come for your Medicare Wellness Visit. I appreciate your ongoing commitment to your health goals. Please review the following plan we discussed and let me know if I can assist you in the future.   These are the goals we discussed:  Goals      Increase physical activity     Patient Stated     Start exercising to lose weight         This is a list of the screening recommended for you and due dates:  Health Maintenance  Topic Date Due   COVID-19 Vaccine (6 - 2023-24 season) 10/06/2022*   Medicare Annual Wellness Visit  04/19/2023   Mammogram  05/21/2023   DTaP/Tdap/Td vaccine (2 - Td or Tdap) 04/02/2025   Colon Cancer Screening  07/12/2030   Pneumonia Vaccine  Completed   Flu Shot  Completed   DEXA scan (bone density measurement)  Completed   Hepatitis C Screening: USPSTF Recommendation to screen - Ages 102-79 yo.  Completed   Zoster (Shingles) Vaccine  Completed   HPV Vaccine  Aged Out  *Topic was postponed. The date shown is not the original due date.     Next appointment: Follow up in one year for your annual wellness visit.   Preventive Care 72 Years and Older, Female Preventive care refers to lifestyle choices and visits with your health care provider that can promote health and wellness. What does preventive care include? A yearly physical exam. This is also called an annual well check. Dental exams once or twice a year. Routine eye exams. Ask your health care provider how often you should have your eyes checked. Personal lifestyle choices, including: Daily care of your teeth and gums. Regular physical activity. Eating a healthy diet. Avoiding tobacco and drug use. Limiting alcohol use. Practicing safe sex. Taking low-dose aspirin every day. Taking vitamin and mineral supplements as recommended by your health care provider. What happens during an annual well check? The services and screenings done by your health care  provider during your annual well check will depend on your age, overall health, lifestyle risk factors, and family history of disease. Counseling  Your health care provider may ask you questions about your: Alcohol use. Tobacco use. Drug use. Emotional well-being. Home and relationship well-being. Sexual activity. Eating habits. History of falls. Memory and ability to understand (cognition). Work and work Statistician. Reproductive health. Screening  You may have the following tests or measurements: Height, weight, and BMI. Blood pressure. Lipid and cholesterol levels. These may be checked every 5 years, or more frequently if you are over 56 years old. Skin check. Lung cancer screening. You may have this screening every year starting at age 72 if you have a 30-pack-year history of smoking and currently smoke or have quit within the past 15 years. Fecal occult blood test (FOBT) of the stool. You may have this test every year starting at age 72. Flexible sigmoidoscopy or colonoscopy. You may have a sigmoidoscopy every 5 years or a colonoscopy every 10 years starting at age 72. Hepatitis C blood test. Hepatitis B blood test. Sexually transmitted disease (STD) testing. Diabetes screening. This is done by checking your blood sugar (glucose) after you have not eaten for a while (fasting). You may have this done every 1-3 years. Bone density scan. This is done to screen for osteoporosis. You may have this done starting at age 72. Mammogram. This may be done every 1-2 years. Talk to  your health care provider about how often you should have regular mammograms. Talk with your health care provider about your test results, treatment options, and if necessary, the need for more tests. Vaccines  Your health care provider may recommend certain vaccines, such as: Influenza vaccine. This is recommended every year. Tetanus, diphtheria, and acellular pertussis (Tdap, Td) vaccine. You may need a Td  booster every 10 years. Zoster vaccine. You may need this after age 25. Pneumococcal 13-valent conjugate (PCV13) vaccine. One dose is recommended after age 84. Pneumococcal polysaccharide (PPSV23) vaccine. One dose is recommended after age 42. Talk to your health care provider about which screenings and vaccines you need and how often you need them. This information is not intended to replace advice given to you by your health care provider. Make sure you discuss any questions you have with your health care provider. Document Released: 04/06/2015 Document Revised: 11/28/2015 Document Reviewed: 01/09/2015 Elsevier Interactive Patient Education  2017 [place] Prevention in the Home Falls can cause injuries. They can happen to people of all ages. There are many things you can do to make your home safe and to help prevent falls. What can I do on the outside of my home? Regularly fix the edges of walkways and driveways and fix any cracks. Remove anything that might make you trip as you walk through a door, such as a raised step or threshold. Trim any bushes or trees on the path to your home. Use bright outdoor lighting. Clear any walking paths of anything that might make someone trip, such as rocks or tools. Regularly check to see if handrails are loose or broken. Make sure that both sides of any steps have handrails. Any raised decks and porches should have guardrails on the edges. Have any leaves, snow, or ice cleared regularly. Use sand or salt on walking paths during winter. Clean up any spills in your garage right away. This includes oil or grease spills. What can I do in the bathroom? Use night lights. Install grab bars by the toilet and in the tub and shower. Do not use towel bars as grab bars. Use non-skid mats or decals in the tub or shower. If you need to sit down in the shower, use a plastic, non-slip stool. Keep the floor dry. Clean up any water that spills on the floor  as soon as it happens. Remove soap buildup in the tub or shower regularly. Attach bath mats securely with double-sided non-slip rug tape. Do not have throw rugs and other things on the floor that can make you trip. What can I do in the bedroom? Use night lights. Make sure that you have a light by your bed that is easy to reach. Do not use any sheets or blankets that are too big for your bed. They should not hang down onto the floor. Have a firm chair that has side arms. You can use this for support while you get dressed. Do not have throw rugs and other things on the floor that can make you trip. What can I do in the kitchen? Clean up any spills right away. Avoid walking on wet floors. Keep items that you use a lot in easy-to-reach places. If you need to reach something above you, use a strong step stool that has a grab bar. Keep electrical cords out of the way. Do not use floor polish or wax that makes floors slippery. If you must use wax, use non-skid floor wax. Do not  have throw rugs and other things on the floor that can make you trip. What can I do with my stairs? Do not leave any items on the stairs. Make sure that there are handrails on both sides of the stairs and use them. Fix handrails that are broken or loose. Make sure that handrails are as long as the stairways. Check any carpeting to make sure that it is firmly attached to the stairs. Fix any carpet that is loose or worn. Avoid having throw rugs at the top or bottom of the stairs. If you do have throw rugs, attach them to the floor with carpet tape. Make sure that you have a light switch at the top of the stairs and the bottom of the stairs. If you do not have them, ask someone to add them for you. What else can I do to help prevent falls? Wear shoes that: Do not have high heels. Have rubber bottoms. Are comfortable and fit you well. Are closed at the toe. Do not wear sandals. If you use a stepladder: Make sure that it is  fully opened. Do not climb a closed stepladder. Make sure that both sides of the stepladder are locked into place. Ask someone to hold it for you, if possible. Clearly mark and make sure that you can see: Any grab bars or handrails. First and last steps. Where the edge of each step is. Use tools that help you move around (mobility aids) if they are needed. These include: Canes. Walkers. Scooters. Crutches. Turn on the lights when you go into a dark area. Replace any light bulbs as soon as they burn out. Set up your furniture so you have a clear path. Avoid moving your furniture around. If any of your floors are uneven, fix them. If there are any pets around you, be aware of where they are. Review your medicines with your doctor. Some medicines can make you feel dizzy. This can increase your chance of falling. Ask your doctor what other things that you can do to help prevent falls. This information is not intended to replace advice given to you by your health care provider. Make sure you discuss any questions you have with your health care provider. Document Released: 01/04/2009 Document Revised: 08/16/2015 Document Reviewed: 04/14/2014 Elsevier Interactive Patient Education  2017 [employer].

## 2022-04-22 DIAGNOSIS — M542 Cervicalgia: Secondary | ICD-10-CM | POA: Diagnosis not present

## 2022-04-22 DIAGNOSIS — M25511 Pain in right shoulder: Secondary | ICD-10-CM | POA: Diagnosis not present

## 2022-04-22 DIAGNOSIS — M6281 Muscle weakness (generalized): Secondary | ICD-10-CM | POA: Diagnosis not present

## 2022-04-22 DIAGNOSIS — M25512 Pain in left shoulder: Secondary | ICD-10-CM | POA: Diagnosis not present

## 2022-04-24 DIAGNOSIS — M542 Cervicalgia: Secondary | ICD-10-CM | POA: Diagnosis not present

## 2022-04-24 DIAGNOSIS — M25512 Pain in left shoulder: Secondary | ICD-10-CM | POA: Diagnosis not present

## 2022-04-24 DIAGNOSIS — M6281 Muscle weakness (generalized): Secondary | ICD-10-CM | POA: Diagnosis not present

## 2022-04-24 DIAGNOSIS — M25511 Pain in right shoulder: Secondary | ICD-10-CM | POA: Diagnosis not present

## 2022-04-30 ENCOUNTER — Ambulatory Visit: Payer: Medicare Other | Admitting: Family Medicine

## 2022-05-12 ENCOUNTER — Ambulatory Visit (HOSPITAL_BASED_OUTPATIENT_CLINIC_OR_DEPARTMENT_OTHER)
Admission: RE | Admit: 2022-05-12 | Discharge: 2022-05-12 | Disposition: A | Discharge status: Home / Self Care | Payer: Medicare Other | Source: Ambulatory Visit | Attending: Family Medicine | Admitting: Family Medicine

## 2022-05-12 DIAGNOSIS — M8589 Other specified disorders of bone density and structure, multiple sites: Secondary | ICD-10-CM | POA: Diagnosis not present

## 2022-05-12 DIAGNOSIS — Z780 Asymptomatic menopausal state: Secondary | ICD-10-CM | POA: Insufficient documentation

## 2022-07-07 ENCOUNTER — Encounter: Payer: Self-pay | Admitting: *Deleted

## 2022-07-10 ENCOUNTER — Other Ambulatory Visit: Payer: Self-pay | Admitting: Family Medicine

## 2022-07-10 DIAGNOSIS — Z1231 Encounter for screening mammogram for malignant neoplasm of breast: Secondary | ICD-10-CM

## 2022-07-14 ENCOUNTER — Ambulatory Visit: Payer: Medicare Other

## 2022-07-14 ENCOUNTER — Ambulatory Visit: Payer: Medicare Other | Admitting: Family Medicine

## 2022-07-15 ENCOUNTER — Ambulatory Visit (INDEPENDENT_AMBULATORY_CARE_PROVIDER_SITE_OTHER): Payer: Medicare Other | Admitting: Family Medicine

## 2022-07-15 ENCOUNTER — Encounter: Payer: Self-pay | Admitting: Family Medicine

## 2022-07-15 VITALS — BP 132/82 | HR 62 | Temp 98.8°F | Ht 61.0 in | Wt 194.1 lb

## 2022-07-15 DIAGNOSIS — H6991 Unspecified Eustachian tube disorder, right ear: Secondary | ICD-10-CM | POA: Diagnosis not present

## 2022-07-15 DIAGNOSIS — M810 Age-related osteoporosis without current pathological fracture: Secondary | ICD-10-CM | POA: Insufficient documentation

## 2022-07-15 DIAGNOSIS — E785 Hyperlipidemia, unspecified: Secondary | ICD-10-CM

## 2022-07-15 DIAGNOSIS — N3281 Overactive bladder: Secondary | ICD-10-CM | POA: Diagnosis not present

## 2022-07-15 DIAGNOSIS — I10 Essential (primary) hypertension: Secondary | ICD-10-CM | POA: Diagnosis not present

## 2022-07-15 DIAGNOSIS — F411 Generalized anxiety disorder: Secondary | ICD-10-CM

## 2022-07-15 HISTORY — DX: Age-related osteoporosis without current pathological fracture: M81.0

## 2022-07-15 HISTORY — DX: Generalized anxiety disorder: F41.1

## 2022-07-15 MED ORDER — BACLOFEN 10 MG PO TABS: 10.0000 mg | ORAL_TABLET | Freq: Three times a day (TID) | ORAL | 0 refills | Status: DC

## 2022-07-15 MED ORDER — FLUTICASONE PROPIONATE 50 MCG/ACT NA SUSP
2.0000 | Freq: Every day | NASAL | 6 refills | Status: AC
Start: 2022-07-15 — End: ?

## 2022-07-15 MED ORDER — OLMESARTAN-AMLODIPINE-HCTZ 40-5-25 MG PO TABS: 1.0000 | ORAL_TABLET | Freq: Every day | ORAL | 2 refills | Status: DC

## 2022-07-15 NOTE — Progress Notes (Signed)
Chief Complaint  Patient presents with   Follow-up    6 month   Shoulder Pain    Subjective Cynthia Montgomery is a 72 y.o. female who presents for hypertension follow up. She does not monitor home blood pressures. She is compliant with medications- olmesartan-amlodipine-hctz 40-5-25 mg/d. Patient has these side effects of medication: none She is adhering to a healthy diet overall. Current exercise: some walking No CP or SOB.   Hyperlipidemia Patient presents for hyperlipidemia follow up. Currently being treated with Crestor 10 mg/d and compliance with treatment thus far has been good. She denies myalgias. Diet/exercise as above.   GAD- well controlled on Celexa 20 mg/d. Reports compliance, no AE's. No SI or HI. No self medication. Not following w counselor/therapist.   OAB- urinates 3x/night. Improved from before. Taking oxybutynin 15 mg qd. Reports compliance. No AE's. Content currently.   Osteoporosis-currently taking Fosamax 70 mg weekly.  Reports compliance effects.  Exercise as above.  She is also taking a vitamin D and calcium supplement.  No recent falls or fractures.   Past Medical History:  Diagnosis Date   Abnormal myocardial perfusion study 07/22/2018   Acute pharyngitis 09/26/2017   Adiposity 09/26/2013   AKI (acute kidney injury) 01/24/2017   Arthrofibrosis of knee joint, right 10/11/2013   Asthma    Chronic back pain 08/26/2019   Edema, unspecified 09/26/2013   Essential hypertension    Dx 30 ys ago; normal BP 130/60 (with med compliance)   Exertional chest pain 07/22/2018   Facet arthropathy, lumbosacral 06/15/2015   Gastro-esophageal reflux disease without esophagitis 09/26/2013   Gonalgia 09/26/2013   Heart murmur 02/27/2016   History of cataracts    bilateral; s/p surgery   History of colon polyps    Hyperlipidemia 04/23/2020   Hypokalemia 09/26/2013   Intervertebral disc degeneration, 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   Left knee pain 04/06/2007   Left  thyroid nodule 10/28/2017   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   Nonintractable episodic headache 07/27/2017   Nuclear sclerotic cataract of left eye 07/13/2014   OA (osteoarthritis) of knee    s/p left TKR 09/2010   OAB (overactive bladder) 07/19/2019   Obesity, Class II, BMI 35-39.9 08/26/2019   Positive TB test    Primary osteoarthritis of right knee 07/19/2013   Pure hypercholesterolemia 04/06/2007   Annotation: reports a history, and that she was on a medication for it   RBBB (right bundle branch block) 07/22/2018   S/P total knee replacement, right 08/26/2016   Snoring 08/26/2019   STOP BANG 4   Syncope 01/24/2017   Transaminitis 01/24/2017   Xerostomia 10/28/2017    Exam BP 132/82 (BP Location: Left Arm, Patient Position: Sitting, Cuff Size: Large)   Pulse 62   Temp 98.8 F (37.1 C) (Oral)   Ht  (1.549 m)   Wt 194 lb 2 oz (88.1 kg)   SpO2 97%   BMI 36.68 kg/m  General:  well developed, well nourished, in no apparent distress Heart: RRR, no bruits, no LE edema Lungs: clear to auscultation, no accessory muscle use Psych: well oriented with normal range of affect and appropriate judgment/insight  Essential hypertension - Plan: Olmesartan-amLODIPine-HCTZ 40-5-25 MG TABS  OAB (overactive bladder)  GAD (generalized anxiety disorder)  ETD (Eustachian tube dysfunction), right - Plan: fluticasone (FLONASE) 50 MCG/ACT nasal spray  Hyperlipidemia, unspecified hyperlipidemia type - Plan: Comprehensive metabolic panel, Lipid panel  Chronic, stable.  Continue olmesartan-amlodipine-hydrochlorothiazide 40-5-25 mg daily.  Counseled on diet and exercise. Chronic, stable.  Continue  oxybutynin 15 mg daily. Chronic, stable.  Continue Celexa 20 mg daily. Go back on Flonase. Chronic, stable.  Continue Crestor 10 mg daily. F/u in 6 months or as needed. The patient voiced understanding and agreement to the plan.  Jilda Roche [place], DO 07/15/22  2:05 PM

## 2022-07-15 NOTE — Patient Instructions (Signed)
Give us 2-3 business days to get the results of your labs back.   Keep the diet clean and stay active.  Let us know if you need anything. 

## 2022-07-16 LAB — COMPREHENSIVE METABOLIC PANEL
ALT: 21 U/L (ref 0–35)
AST: 21 U/L (ref 0–37)
Albumin: 4.1 g/dL (ref 3.5–5.2)
Alkaline Phosphatase: 54 U/L (ref 39–117)
BUN: 18 mg/dL (ref 6–23)
CO2: 30 mEq/L (ref 19–32)
Calcium: 9.7 mg/dL (ref 8.4–10.5)
Chloride: 101 mEq/L (ref 96–112)
Creatinine, Ser: 1.09 mg/dL (ref 0.40–1.20)
GFR: 50.95 mL/min — ABNORMAL LOW (ref >60.00)
Glucose, Bld: 79 mg/dL (ref 70–99)
Potassium: 4 mEq/L (ref 3.5–5.1)
Sodium: 139 mEq/L (ref 135–145)
Total Bilirubin: 0.3 mg/dL (ref 0.2–1.2)
Total Protein: 7.1 g/dL (ref 6.0–8.3)

## 2022-07-16 LAB — LIPID PANEL
Cholesterol: 166 mg/dL (ref 0–200)
HDL: 61.9 mg/dL (ref >39.00)
LDL Cholesterol: 89 mg/dL (ref 0–99)
NonHDL: 103.87
Total CHOL/HDL Ratio: 3
Triglycerides: 72 mg/dL (ref 0.0–149.0)
VLDL: 14.4 mg/dL (ref 0.0–40.0)

## 2022-07-17 ENCOUNTER — Ambulatory Visit
Admission: RE | Admit: 2022-07-17 | Discharge: 2022-07-17 | Disposition: A | Discharge status: Home / Self Care | Payer: Medicare Other | Source: Ambulatory Visit | Attending: Family Medicine | Admitting: Family Medicine

## 2022-07-17 DIAGNOSIS — Z1231 Encounter for screening mammogram for malignant neoplasm of breast: Secondary | ICD-10-CM | POA: Diagnosis not present

## 2022-08-19 ENCOUNTER — Other Ambulatory Visit: Payer: Self-pay | Admitting: Family Medicine

## 2022-09-03 ENCOUNTER — Encounter: Payer: Self-pay | Admitting: Family Medicine

## 2022-09-03 ENCOUNTER — Ambulatory Visit (INDEPENDENT_AMBULATORY_CARE_PROVIDER_SITE_OTHER): Payer: Medicare Other | Admitting: Family Medicine

## 2022-09-03 VITALS — BP 120/80 | HR 67 | Temp 98.5°F | Ht 61.0 in | Wt 194.0 lb

## 2022-09-03 DIAGNOSIS — R0681 Apnea, not elsewhere classified: Secondary | ICD-10-CM | POA: Diagnosis not present

## 2022-09-03 DIAGNOSIS — N3281 Overactive bladder: Secondary | ICD-10-CM

## 2022-09-03 DIAGNOSIS — M6281 Muscle weakness (generalized): Secondary | ICD-10-CM

## 2022-09-03 MED ORDER — OXYBUTYNIN CHLORIDE ER 15 MG PO TB24
15.0000 mg | ORAL_TABLET | Freq: Every day | ORAL | 1 refills | Status: DC
Start: 2022-09-03 — End: 2023-09-16

## 2022-09-03 MED ORDER — BACLOFEN 10 MG PO TABS: 10.0000 mg | ORAL_TABLET | Freq: Three times a day (TID) | ORAL | 2 refills | Status: DC

## 2022-09-03 NOTE — Progress Notes (Signed)
Chief Complaint  Patient presents with   Follow-up    Subjective: Patient is a 72 y.o. female here for witness apneic episodes.  1 month ago, the patient went on a girls trip and her sister told her that she snores and stops breathing at night.  She has never had a sleep study but is interested in having one now.  She wakes up with headaches and feeling like she has not slept at all. + Fatigue.  She has been told she snores by her spouse as well.  Patient has a history of overactive bladder and has not been taking anything recently.  She goes to the restroom every 2 hours at night.  She was on Ditropan historically and does not remember why she stopped.  She was tolerating it well historically.  She is having some lingering left upper extremity weakness.  She is left-handed.  She did fall several months ago and was at physical therapy but stopped going due to cost.  She is not dropping things.  No recent injury or change in activity.  Past Medical History:  Diagnosis Date   Abnormal myocardial perfusion study 07/22/2018   Acute pharyngitis 09/26/2017   Adiposity 09/26/2013   AKI (acute kidney injury) 01/24/2017   Arthrofibrosis of knee joint, right 10/11/2013   Asthma    Chronic back pain 08/26/2019   Edema, unspecified 09/26/2013   Essential hypertension    Dx 30 ys ago; normal BP 130/60 (with med compliance)   Exertional chest pain 07/22/2018   Facet arthropathy, lumbosacral 06/15/2015   Gastro-esophageal reflux disease without esophagitis 09/26/2013   Gonalgia 09/26/2013   Heart murmur 02/27/2016   History of cataracts    bilateral; s/p surgery   History of colon polyps    Hyperlipidemia 04/23/2020   Hypokalemia 09/26/2013   Intervertebral disc degeneration, 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   Left knee pain 04/06/2007   Left thyroid nodule 10/28/2017   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   Nonintractable episodic headache 07/27/2017   Nuclear sclerotic cataract of left eye 07/13/2014   OA  (osteoarthritis) of knee    s/p left TKR 09/2010   OAB (overactive bladder) 07/19/2019   Obesity, Class II, BMI 35-39.9 08/26/2019   Positive TB test    Primary osteoarthritis of right knee 07/19/2013   Pure hypercholesterolemia 04/06/2007   Annotation: reports a history, and that she was on a medication for it   RBBB (right bundle branch block) 07/22/2018   S/P total knee replacement, right 08/26/2016   Snoring 08/26/2019   STOP BANG 4   Syncope 01/24/2017   Transaminitis 01/24/2017   Xerostomia 10/28/2017    Objective: BP 120/80 (BP Location: Left Arm, Patient Position: Sitting, Cuff Size: Large)   Pulse 67   Temp 98.5 F (36.9 C) (Oral)   Ht 5\' 1"  (1.549 m)   Wt 194 lb (88 kg)   SpO2 98%   BMI 36.66 kg/m  General: Awake, appears stated age Heart: RRR, no LE edema Lungs: CTAB, no rales, wheezes or rhonchi. No accessory muscle use Abdomen: Bowel sounds present, soft, nontender, nondistended Neuro: 4/5 strength with left elbow flexion and left shoulder external rotation; 5/5 strength throughout the upper extremities otherwise; DTRs equal and symmetric in the upper extremities MSK: No TTP in the upper extremities, no asymmetry Psych: Age appropriate judgment and insight, normal affect and mood  Assessment and Plan: Witnessed apneic spells - Plan: Ambulatory referral to Neurology  OAB (overactive bladder) - Plan: oxybutynin (DITROPAN XL) 15 MG 24 hr  tablet  Muscle weakness of left upper extremity  Refer to neurology for a sleep evaluation. Chronic, not controlled.  Add back to treatment XL 50 mg nightly.  Monitor fluid intake before bed.  No caffeine after noon.  Abstain from alcohol. Stretches and exercises provided for the wrist, shoulder, and neck.  Physical therapy if no improvement. Follow-up as originally scheduled. The patient voiced understanding and agreement to the plan.  Jilda Roche [place], DO 09/03/22  5:07 PM

## 2022-09-03 NOTE — Patient Instructions (Signed)
Heat (pad or rice pillow in microwave) over affected area, 10-15 minutes twice daily.   Ice/cold pack over area for 10-15 min twice daily.  OK to take Tylenol 1000 mg (2 extra strength tabs) or 975 mg (3 regular strength tabs) every 6 hours as needed.  Let's go back on the [place] for the urination.   If you do not hear anything about your referral in the next 1-2 weeks, call our office and ask for an update.  Let us know if you need anything.  EXERCISES  RANGE OF MOTION (ROM) AND STRETCHING EXERCISES These exercises may help you when beginning to rehabilitate your injury. While completing these exercises, remember:  Restoring tissue flexibility helps normal motion to return to the joints. This allows healthier, less painful movement and activity. An effective stretch should be held for at least 30 seconds. A stretch should never be painful. You should only feel a gentle lengthening or release in the stretched tissue.  ROM - Pendulum Bend at the waist so that your right / left arm falls away from your body. Support yourself with your opposite hand on a solid surface, such as a table or a countertop. Your right / left arm should be perpendicular to the ground. If it is not perpendicular, you need to lean over farther. Relax the muscles in your right / left arm and shoulder as much as possible. Gently sway your hips and trunk so they move your right / left arm without any use of your right / left shoulder muscles. Progress your movements so that your right / left arm moves side to side, then forward and backward, and finally, both clockwise and counterclockwise. Complete 10-15 repetitions in each direction. Many people use this exercise to relieve discomfort in their shoulder as well as to gain range of motion. Repeat 2 times. Complete this exercise 3 times per week.  STRETCH - Flexion, Standing Stand with good posture. With an underhand grip on your right / left hand and an overhand grip  on the opposite hand, grasp a broomstick or cane so that your hands are a little more than shoulder-width apart. Keeping your right / left elbow straight and shoulder muscles relaxed, push the stick with your opposite hand to raise your right / left arm in front of your body and then overhead. Raise your arm until you feel a stretch in your right / left shoulder, but before you have increased shoulder pain. Try to avoid shrugging your right / left shoulder as your arm rises by keeping your shoulder blade tucked down and toward your mid-back spine. Hold 30 seconds. Slowly return to the starting position. Repeat 2 times. Complete this exercise 3 times per week.  STRETCH - Internal Rotation Place your right / left hand behind your back, palm-up. Throw a towel or belt over your opposite shoulder. Grasp the towel/belt with your right / left hand. While keeping an upright posture, gently pull up on the towel/belt until you feel a stretch in the front of your right / left shoulder. Avoid shrugging your right / left shoulder as your arm rises by keeping your shoulder blade tucked down and toward your mid-back spine. Hold 30. Release the stretch by lowering your opposite hand. Repeat 2 times. Complete this exercise 3 times per week.  STRETCH - External Rotation and Abduction Stagger your stance through a doorframe. It does not matter which foot is forward. As instructed by your physician, physical therapist or athletic trainer, place your hands:  And forearms above your head and on the door frame. And forearms at head-height and on the door frame. At elbow-height and on the door frame. Keeping your head and chest upright and your stomach muscles tight to prevent over-extending your low-back, slowly shift your weight onto your front foot until you feel a stretch across your chest and/or in the front of your shoulders. Hold 30 seconds. Shift your weight to your back foot to release the stretch. Repeat 2  times. Complete this stretch 3 times per week.   STRENGTHENING EXERCISES  These exercises may help you when beginning to rehabilitate your injury. They may resolve your symptoms with or without further involvement from your physician, physical therapist or athletic trainer. While completing these exercises, remember:  Muscles can gain both the endurance and the strength needed for everyday activities through controlled exercises. Complete these exercises as instructed by your physician, physical therapist or athletic trainer. Progress the resistance and repetitions only as guided. You may experience muscle soreness or fatigue, but the pain or discomfort you are trying to eliminate should never worsen during these exercises. If this pain does worsen, stop and make certain you are following the directions exactly. If the pain is still present after adjustments, discontinue the exercise until you can discuss the trouble with your clinician. If advised by your physician, during your recovery, avoid activity or exercises which involve actions that place your right / left hand or elbow above your head or behind your back or head. These positions stress the tissues which are trying to heal.  STRENGTH - Scapular Depression and Adduction With good posture, sit on a firm chair. Supported your arms in front of you with pillows, arm rests or a table top. Have your elbows in line with the sides of your body. Gently draw your shoulder blades down and toward your mid-back spine. Gradually increase the tension without tensing the muscles along the top of your shoulders and the back of your neck. Hold for 3 seconds. Slowly release the tension and relax your muscles completely before completing the next repetition. After you have practiced this exercise, remove the arm support and complete it in standing as well as sitting. Repeat 2 times. Complete this exercise 3 times per week.   STRENGTH - External Rotators Secure  a rubber exercise band/tubing to a fixed object so that it is at the same height as your right / left elbow when you are standing or sitting on a firm surface. Stand or sit so that the secured exercise band/tubing is at your side that is not injured. Bend your elbow 90 degrees. Place a folded towel or small pillow under your right / left arm so that your elbow is a few inches away from your side. Keeping the tension on the exercise band/tubing, pull it away from your body, as if pivoting on your elbow. Be sure to keep your body steady so that the movement is only coming from your shoulder rotating. Hold 3 seconds. Release the tension in a controlled manner as you return to the starting position. Repeat 2 times. Complete this exercise 3 times per week.   STRENGTH - Supraspinatus Stand or sit with good posture. Grasp a 2-3 lb weight or an exercise band/tubing so that your hand is "thumbs-up," like when you shake hands. Slowly lift your right / left hand from your thigh into the air, traveling about 30 degrees from straight out at your side. Lift your hand to shoulder height or as  far as you can without increasing any shoulder pain. Initially, many people do not lift their hands above shoulder height. Avoid shrugging your right / left shoulder as your arm rises by keeping your shoulder blade tucked down and toward your mid-back spine. Hold for 3 seconds. Control the descent of your hand as you slowly return to your starting position. Repeat 2 times. Complete this exercise 3 times per week.   STRENGTH - Shoulder Extensors Secure a rubber exercise band/tubing so that it is at the height of your shoulders when you are either standing or sitting on a firm arm-less chair. With a thumbs-up grip, grasp an end of the band/tubing in each hand. Straighten your elbows and lift your hands straight in front of you at shoulder height. Step back away from the secured end of band/tubing until it becomes  tense. Squeezing your shoulder blades together, pull your hands down to the sides of your thighs. Do not allow your hands to go behind you. Hold for 3 seconds. Slowly ease the tension on the band/tubing as you reverse the directions and return to the starting position. Repeat 2 times. Complete this exercise 3 times per week.   STRENGTH - Scapular Retractors Secure a rubber exercise band/tubing so that it is at the height of your shoulders when you are either standing or sitting on a firm arm-less chair. With a palm-down grip, grasp an end of the band/tubing in each hand. Straighten your elbows and lift your hands straight in front of you at shoulder height. Step back away from the secured end of band/tubing until it becomes tense. Squeezing your shoulder blades together, draw your elbows back as you bend them. Keep your upper arm lifted away from your body throughout the exercise. Hold 3 seconds. Slowly ease the tension on the band/tubing as you reverse the directions and return to the starting position. Repeat 2 times. Complete this exercise 3 times per week.  STRENGTH - Scapular Depressors Find a sturdy chair without wheels, such as a from a dining room table. Keeping your feet on the floor, lift your bottom from the seat and lock your elbows. Keeping your elbows straight, allow gravity to pull your body weight down. Your shoulders will rise toward your ears. Raise your body against gravity by drawing your shoulder blades down your back, shortening the distance between your shoulders and ears. Although your feet should always maintain contact with the floor, your feet should progressively support less body weight as you get stronger. Hold 3 seconds. In a controlled and slow manner, lower your body weight to begin the next repetition. Repeat 2 times. Complete this exercise 3 times per week.    This information is not intended to replace advice given to you by your health care provider. Make sure  you discuss any questions you have with your health care provider.   Document Released: 01/22/2005 Document Revised: 03/31/2014 Document Reviewed: 06/22/2008 [employer] Interactive Patient Education 2016 [employer]  Biceps Tendon Disruption (Distal) Rehab Ask your health care provider which exercises are safe for you. Do exercises exactly as told by your health care provider and adjust them as directed. It is normal to feel mild stretching, pulling, tightness, or discomfort as you do these exercises, but you should stop right away if you feel sudden pain or your pain gets worse. Do not begin these exercises until told by your health care provider. Stretching and range of motion exercises These exercises warm up your muscles and joints and improve  the movement and flexibility of your arm. These exercises can also help to relieve pain, numbness, and tingling. Exercise A: Elbow flexion, passive  Stand or sit with your left / right arm at your side. Use your other hand to gently push your left / right hand toward your shoulder. Bend your elbow as far as your health care provider tells you to. You may be instructed to try to bend your elbow more and more over time. Hold for 10 seconds. Slowly return to the starting position. Repeat 2 times. Complete this exercise 3 times per week. Exercise B: Supination, passive Sit with your left / right elbow bent to an "L" shape (90 degrees) and your forearm resting on a table, palm-down. Keeping your upper body and shoulder still, use your other hand to rotate your left / right palm up. Stop when you feel a gentle to moderate stretch. Hold for 10 seconds. Slowly return to the starting position. Repeat 2 times. Complete this exercise 3 times per week. Exercise C: Pronation, passive  Sit with your left / right elbow bent to an "L" shape (90 degrees) and your forearm resting on a table, palm-up. Keeping your upper body and shoulder still, use your other hand  to rotate your left / right palm down. Stop when you feel a gentle to moderate stretch. Hold for 10 seconds. Slowly return to the starting position. Repeat 2 times. Complete this exercise 3 times per week. Exercise D: Elbow extension, active Start this exercise only when your health care provider tells you. Stand or sit with your left / right elbow bent and your palm facing in, toward your body. Slowly straighten your elbow using only your arm muscles. Stop when you feel a very slight stretch at the front of your arm, or when you reach the position where your health care provider tells you to stop. You may be instructed to try to straighten your arm more and more each week. Hold for 3 seconds. Slowly return to the starting position. Repeat 2 times. Complete this exercise 3 times per week. Exercise E: Elbow flexion, active Start this exercise only when your health care provider tells you. Stand or sit with your left / right elbow bent and your palm facing in, toward your body. Bend your elbow as far as you can using only your arm muscles. Hold for 3 seconds. Slowly return to the starting position. Repeat 2 times. Complete this exercise 3 times per week. Exercise F: Supination, active Start this exercise only when your health care provider tells you. Stand or sit with your left / right elbow bent to an "L" shape (90 degrees). Rotate your palm up until you feel a gentle stretch on the inside of your forearm. Hold for 3 seconds. Slowly return to the starting position. Repeat 2 times. Complete this exercise 3 times per week. Exercise G: Pronation, active  Start this exercise only when your health care provider tells you. Stand or sit with your left / right elbow bent to an "L" shape (90 degrees). Rotate your palm down until you feel a gentle stretch on the top of your forearm. Hold for 3 seconds. Slowly release and return to the starting position. Repeat 2 times. Complete this exercise 3  times per week. Exercise H: Elbow extension, passive, supine Lie on your back in a comfortable position that allows you to relax your arm muscles. Place a folded towel under your left / right upper arm so your elbow and shoulder are  at the same height. Use your other arm to raise your left / right arm until your elbow does not rest on the bed or towel. Hold your left / right arm out straight with your other hand supporting it. Let the weight of your hand stretch the inside of your elbow. Keep your arm and chest muscles relaxed. If directed, you may hold a 5-10 weight in your hand to increase the intensity of the stretch. Hold for 3 seconds. Slowly release the stretch. Repeat 2 times. Complete this exercise 3 times per week Strengthening exercises These exercises build strength and endurance in your arm and shoulder. Endurance is the ability to use your muscles for a long time, even after your muscles get tired. Do not start any strengthening exercises until told by your health care provider. Exercise I: Elbow flexion, isometric  Stand or sit with your left / right arm at waist height. Your palm should face in, toward your body. Place your other hand on top of your left / right forearm. Gently push down while you resist with your left / right arm. Use about half (50%) effort with both arms. You may be instructed to use more and more effort with your arms each week. Try not to let your right / left elbow move. Hold for 3 seconds. Let your muscles relax completely before you repeat this exercise. Repeat 2 times. Complete this exercise 3 times per week. Exercise J: Elbow extension, isometric  Stand or sit with your left / right arm at waist height. Your palm should face in, toward your body. Place your other hand on the bottom of your left / right forearm. Gently push up while you resist with your left / right arm. Use about half (50%) effort with both arms. You may be instructed to use more and  more effort with your arms each week. Try not to let your left / right elbow move. Hold for 3 seconds. Let your muscles relax completely before you repeat this exercise. Repeat 2 times. Complete this exercise 3 times per week. Exercise K: Biceps curls ( elbow flexion, supinated) Sit on a stable chair without armrests, or stand. Hold a 5-10 lb weight in your left / right hand. Your palm should face out, away from your body, at the starting position. Bend your left / right elbow and move your hand up toward your shoulder. Slowly return to the starting position. Repeat 2 times. Complete this exercise 3 times per week. Exercise L: Hammer curls ( elbow flexion, neutral forearm) Sit on a stable chair without armrests, or stand. Hold a 5-10 lb weight in your left / right hand, or hold an exercise band with both hands. Your palms should face each other at the starting position. Bend your left / right elbow and move your hand up toward your shoulder. Keep your other arm straight. Slowly return to the starting position. Repeat 2 times. Complete this exercise 3 times per week. Exercise M: Triceps curls ( elbow extension) Lie on your back. Hold a 5-10 lb weight in your left / right hand. Bend your left / right elbow to an "L" shape (90 degrees) so the weight is in front of your face, over your chest, and your elbow is pointed up to the ceiling. Straighten your elbow, raising your hand toward the ceiling. Use your other hand to support your left / right upper arm. Slowly return to the starting position. Repeat 2 times. Complete this exercise 3 times per week.  Exercise N: Elbow extension with exercise band  Sit on a stable chair without armrests, or stand. Hold an exercise band in both hands. Keeping your upper arms at your side, bring both hands up to your shoulder. Keep your left / right hand just below your other hand. Straighten your left / right elbow while keeping your other arm still. Hold  for 3 seconds. Slowly bend your elbow to return to the starting position. Repeat 2 times. Complete this exercise 3 times per week. Exercise O: Supination  Sit with the your left / right forearm supported on a table. Your elbow should be at waist height. Rest your hand over the edge of the table, palm-down. Gently grasp a lightweight hammer. Without moving your left / right elbow, slowly rotate your palm up, to a "thumbs-up" position. Hold for 3 seconds. Slowly return to the starting position. Repeat 2 times. Complete this exercise 3 times per weeky. Exercise P: Pronation  Sit with your left / right forearm supported on a table. Your elbow should be at waist height. Rest your hand over the edge of the table, palm-up. Gently grasp a lightweight hammer. Without moving your left / right elbow, slowly rotate your palm down. Hold for 3 seconds. Slowly return to the starting position. Repeat 2 times. Complete this exercise 3 times per week. This information is not intended to replace advice given to you by your health care provider. Make sure you discuss any questions you have with your health care provider. Document Released: 03/10/2005 Document Revised: 11/15/2015 Document Reviewed: 12/03/2014 [employer] Interactive Patient Education  2018 [employer]  Wrist and Forearm Exercises Do exercises exactly as told by your health care provider and adjust them as directed. It is normal to feel mild stretching, pulling, tightness, or discomfort as you do these exercises, but you should stop right away if you feel sudden pain or your pain gets worse.   RANGE OF MOTION EXERCISES These exercises warm up your muscles and joints and improve the movement and flexibility of your injured wrist and forearm. These exercises also help to relieve pain, numbness, and tingling. These exercises are done using the muscles in your injured wrist and forearm. Exercise A: Wrist Flexion, Active With your fingers  relaxed, bend your wrist forward as far as you can. Hold this position for 30 seconds. Repeat 2 times. Complete this exercise 3 times per week. Exercise B: Wrist Extension, Active With your fingers relaxed, bend your wrist backward as far as you can. Hold this position for 30 seconds. Repeat 2 times. Complete this exercise 3 times per week. Exercise C: Supination, Active  Stand or sit with your arms at your sides. Bend your left / right elbow to an "L" shape (90 degrees). Turn your palm upward until you feel a gentle stretch on the inside of your forearm. Hold this position for 30 seconds. Slowly return your palm to the starting position. Repeat 2 times. Complete this exercise 3 times per week. Exercise D: Pronation, Active  Stand or sit with your arms at your sides. Bend your left / right elbow to an "L" shape (90 degrees). Turn your palm downward until you feel a gentle stretch on the top of your forearm. Hold this position for 30 seconds. Slowly return your palm to the starting position. Repeat 2 times. Complete this exercise once a day.  STRETCHING EXERCISES These exercises warm up your muscles and joints and improve the movement and flexibility of your injured wrist and forearm. These  exercises also help to relieve pain, numbness, and tingling. These exercises are done using your healthy wrist and forearm to help stretch the muscles in your injured wrist and forearm. Exercise E: Wrist Flexion, Passive  Extend your left / right arm in front of you, relax your wrist, and point your fingers downward. Gently push on the back of your hand. Stop when you feel a gentle stretch on the top of your forearm. Hold this position for 30 seconds. Repeat 2 times. Complete this exercise 3 times per week. Exercise F: Wrist Extension, Passive  Extend your left / right arm in front of you and turn your palm upward. Gently pull your palm and fingertips back so your fingers point downward. You  should feel a gentle stretch on the palm-side of your forearm. Hold this position for 30 seconds. Repeat 2 times. Complete this exercise 3 times per week. Exercise G: Forearm Rotation, Supination, Passive Sit with your left / right elbow bent to an "L" shape (90 degrees) with your forearm resting on a table. Keeping your upper body and shoulder still, use your other hand to rotate your forearm palm-up until you feel a gentle to moderate stretch. Hold this position for 30 seconds. Slowly release the stretch and return to the starting position. Repeat 2 times. Complete this exercise 3 times per week. Exercise H: Forearm Rotation, Pronation, Passive Sit with your left / right elbow bent to an "L" shape (90 degrees) with your forearm resting on a table. Keeping your upper body and shoulder still, use your other hand to rotate your forearm palm-down until you feel a gentle to moderate stretch. Hold this position for 30 seconds. Slowly release the stretch and return to the starting position. Repeat 2 times. Complete this exercise 3 times per week.  STRENGTHENING EXERCISES These exercises build strength and endurance in your wrist and forearm. Endurance is the ability to use your muscles for a long time, even after they get tired. Exercise I: Wrist Flexors  Sit with your left / right forearm supported on a table and your hand resting palm-up over the edge of the table. Your elbow should be bent to an "L" shape (about 90 degrees) and be below the level of your shoulder. Hold a 3-5 lb weight in your left / right hand. Or, hold a rubber exercise band or tube in both hands, keeping your hands at the same level and hip distance apart. There should be a slight tension in the exercise band or tube. Slowly curl your hand up toward your forearm. Hold this position for 3 seconds. Slowly lower your hand back to the starting position. Repeat 2 times. Complete this exercise 3 times per week. Exercise J: Wrist  Extensors  Sit with your left / right forearm supported on a table and your hand resting palm-down over the edge of the table. Your elbow should be bent to an "L" shape (about 90 degrees) and be below the level of your shoulder. Hold a 3-5 lb weight in your left / right hand. Or, hold a rubber exercise band or tube in both hands, keeping your hands at the same level and hip distance apart. There should be a slight tension in the exercise band or tube. Slowly curl your hand up toward your forearm. Hold this position for 3 seconds. Slowly lower your hand back to the starting position. Repeat 2 times. Complete this exercise 3 times per week. Exercise K: Forearm Rotation, Supination  Sit with your left /  right forearm supported on a table and your hand resting palm-down. Your elbow should be at your side, bent to an "L" shape (about 90 degrees), and below the level of your shoulder. Keep your wrist stable and in a neutral position throughout the exercise. Gently hold a lightweight hammer with your left / right hand. Without moving your elbow or wrist, slowly rotate your palm upward to a thumbs-up position. Hold this position for 3 seconds. Slowly return your forearm to the starting position. Repeat 2 times. Complete this exercise 3 times per week. Exercise L: Forearm Rotation, Pronation  Sit with your left / right forearm supported on a table and your hand resting palm-up. Your elbow should be at your side, bent to an "L" shape (about 90 degrees), and below the level of your shoulder. Keep your wrist stable. Do not allow it to move backward or forward during the exercise. Gently hold a lightweight hammer with your left / right hand. Without moving your elbow or wrist, slowly rotate your palm and hand upward to a thumbs-up position. Hold this position for 3 seconds. Slowly return your forearm to the starting position. Repeat 2 times. Complete this exercise 3 times per week. Exercise M: Grip  Strengthening  Hold one of these items in your left / right hand: play dough, therapy putty, a dense sponge, a stress ball, or a large, rolled sock. Squeeze as hard as you can without increasing pain. Hold this position for 5 seconds. Slowly release your grip. Repeat 2 times. Complete this exercise 3 times per week.  This information is not intended to replace advice given to you by your health care provider. Make sure you discuss any questions you have with your health care provider. Document Released: 01/22/2005 Document Revised: 12/03/2015 Document Reviewed: 12/03/2014 Elsevier Interactive Patient Education  [employer].

## 2022-09-26 ENCOUNTER — Encounter: Payer: Self-pay | Admitting: Family Medicine

## 2022-10-03 ENCOUNTER — Other Ambulatory Visit: Payer: Self-pay | Admitting: Cardiology

## 2022-10-03 DIAGNOSIS — I10 Essential (primary) hypertension: Secondary | ICD-10-CM

## 2022-10-09 ENCOUNTER — Ambulatory Visit: Payer: Medicare Other | Admitting: Neurology

## 2022-10-09 ENCOUNTER — Encounter: Payer: Self-pay | Admitting: Neurology

## 2022-10-09 VITALS — BP 137/83 | HR 71 | Ht 61.0 in | Wt 189.0 lb

## 2022-10-09 DIAGNOSIS — G478 Other sleep disorders: Secondary | ICD-10-CM

## 2022-10-09 DIAGNOSIS — R0683 Snoring: Secondary | ICD-10-CM | POA: Diagnosis not present

## 2022-10-09 DIAGNOSIS — R351 Nocturia: Secondary | ICD-10-CM

## 2022-10-09 DIAGNOSIS — G4700 Insomnia, unspecified: Secondary | ICD-10-CM | POA: Diagnosis not present

## 2022-10-09 NOTE — Progress Notes (Signed)
SLEEP MEDICINE CLINIC    Provider:  Melvyn Novas, MD  Primary Care Physician:  Sharlene Dory, DO [address] [place] [address]     Referring Provider: Bluford Main [address] [place],  [place] [address]          Chief Complaint according to patient   Patient presents with:     New Patient (Initial Visit)           HISTORY OF PRESENT ILLNESS:  Cynthia Montgomery is a 72 y.o. female patient who is seen upon PCP referral on 10/09/2022  for a SLEEP MEDICINE EVALUATION:Pt is here for sleep consult. No hx of SS. States she snores, apneic events noted by her visiting sister, with whom she shared a bedroom- sleeps 4-5 hours a night. She has chronic insomnia, wakes up and stays awake for hours in the middle of the night, Fragmented sleep. She reports frequent vivid dreams, often nightmarish, sometimes waking her up.  She has 2 nocturia breaks at night, used to be more , but reduced on a Ditropan medication. Sleep talker, not walker.  STOP BANG of 4.     I have the pleasure of seeing Cynthia Montgomery on 10/09/22,  a right-handed female with a possible sleep disorder.    Sleep relevant medical history: in 11-2021 she had a concussion.  She was left with dizziness and headaches.   Family medical /sleep history: mother was on CPAP with OSA, died a 28 , of cardiac failure.    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  Family status is separated , with adult children,  grandchildren.  One great-grandson .  The patient currently works. Tobacco use:Marland Kitchen Quit 40 years ago . ETOH use ; none , Caffeine intake in form of Coffee( ) Soda( sometimes ) Tea ( 1 glass a day) or energy drinks.    Sleep habits are as follows: The patient's dinner time is between 6 PM. The patient goes to bed at 10 PM and continues to sleep for intervals  1-2  hours, wakes for 2-3 bathroom breaks, the first time at 12 AM.   The preferred sleep position is supine , with the support of 1 pillows. She  props her feet on 2 pillows. Dreams are reportedly frequent/vivid.  Her bedroom is  quiet and cool and dark.   The patient wakes up spontaneously. 5-6  AM is the usual rise time. She reports not feeling refreshed or restored in AM, with symptoms such as dry mouth, morning headaches, and residual fatigue.  Naps are taken infrequently, lasting from 20 to 35 minutes and are  refreshing.  Review of Systems: Out of a complete 14 system review, the patient complains of only the following symptoms, and all other reviewed systems are negative.:  Fatigue, sleepiness , snoring, fragmented sleep, Insomnia from dreams, nocturia and knee pain, , Nocturia    How likely are you to doze in the following situations: 0 = not likely, 1 = slight chance, 2 = moderate chance, 3 = high chance   Sitting and Reading? Watching Television? Sitting inactive in a public place (theater or meeting)? As a passenger in a car for an hour without a break? Lying down in the afternoon when circumstances permit? Sitting and talking to someone? Sitting quietly after lunch without  alcohol? In a car, while stopped for a few minutes in traffic?   Total = 15/ 24 points   FSS endorsed at 40/ 63 points.   Social History   Socioeconomic History   Marital status: Married, but separated- lives with a grandson and Surveyor, mining     Spouse name: Not on file   Number of children: Not on file   Years of education: 12   Highest education level: High school graduate  Occupational History   Occupation: Retired  Tobacco Use   Smoking status: Former    Types: Cigarettes   Smokeless tobacco: Never   Tobacco comments:    quit 30 years ago, 1980  Vaping Use   Vaping status: Never Used  Substance and Sexual Activity   Alcohol use: No   Drug use: No   Sexual activity: Yes    Birth  control/protection: None  Other Topics Concern   Not on file  Social History Narrative   Not on file   Social Determinants of Health   Financial Resource Strain: Low Risk  (03/26/2021)   Overall Financial Resource Strain (CARDIA)    Difficulty of Paying Living Expenses: Not hard at all  Food Insecurity: No Food Insecurity (04/18/2022)   Hunger Vital Sign    Worried About Running Out of Food in the Last Year: Never true    Ran Out of Food in the Last Year: Never true  Transportation Needs: No Transportation Needs (04/18/2022)   PRAPARE - Administrator, Civil Service (Medical): No    Lack of Transportation (Non-Medical): No  Physical Activity: Inactive (03/26/2021)   Exercise Vital Sign    Days of Exercise per Week: 0 days    Minutes of Exercise per Session: 0 min  Stress: No Stress Concern Present (03/26/2021)   [employer] of Occupational Health - Occupational Stress Questionnaire    Feeling of Stress : Not at all  Social Connections: Unknown (12/17/2021)   Received from [facility]   Social Network    Social Network: Not on file    Family History  Problem Relation Age of Onset   Arthritis Mother    Hypertension Mother    Memory loss Mother    Cancer Father    Pancreatic cancer Father    Diabetes Sister    Hypertension Sister    Memory loss Sister    Arthritis Daughter    Asthma Daughter    Kidney disease Daughter    Memory loss Maternal Aunt    Colon cancer Neg Hx    Colon polyps Neg Hx    Esophageal cancer Neg Hx    Stomach cancer Neg Hx    Rectal cancer Neg Hx     Past Medical History:  Diagnosis Date   Abnormal myocardial perfusion study 07/22/2018   Acute pharyngitis 09/26/2017   Adiposity 09/26/2013   AKI (acute kidney injury) 01/24/2017   Arthrofibrosis of knee joint, right 10/11/2013   Asthma    Chronic back pain 08/26/2019   Edema, unspecified 09/26/2013   Essential hypertension    Dx 30 ys ago; normal BP 130/60 (with med compliance)    Exertional chest pain 07/22/2018   Facet arthropathy, lumbosacral 06/15/2015   Gastro-esophageal reflux disease without esophagitis 09/26/2013   Gonalgia 09/26/2013   Heart murmur 02/27/2016   History of cataracts    bilateral; s/p surgery   History of colon polyps    Hyperlipidemia 04/23/2020   Hypokalemia 09/26/2013   Intervertebral disc degeneration, lumbar region  09/26/2013   Left knee pain 04/06/2007   Left thyroid nodule 10/28/2017   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   Nonintractable episodic headache 07/27/2017   Nuclear sclerotic cataract of left eye 07/13/2014   OA (osteoarthritis) of knee    s/p left TKR 09/2010   OAB (overactive bladder) 07/19/2019   Obesity, Class II, BMI 35-39.9 08/26/2019   Positive TB test    Primary osteoarthritis of right knee 07/19/2013   Pure hypercholesterolemia 04/06/2007   Annotation: reports a history, and that she was on a medication for it   RBBB (right bundle branch block) 07/22/2018   S/P total knee replacement, right 08/26/2016   Snoring 08/26/2019   STOP BANG 4   Syncope 01/24/2017   Transaminitis 01/24/2017   Xerostomia 10/28/2017    Past Surgical History:  Procedure Laterality Date   CATARACT EXTRACTION, BILATERAL  2018   KNEE SURGERY     states 2 surgeries on each knee around 2016 and 2017   TUBAL LIGATION       Current Outpatient Medications on File Prior to Visit  Medication Sig Dispense Refill   aspirin EC 81 MG tablet Take 81 mg by mouth daily.     baclofen (LIORESAL) 10 MG tablet Take 1 tablet (10 mg total) by mouth 3 (three) times daily. 90 tablet 2   cholecalciferol (VITAMIN D) 400 units TABS tablet Take 400 Units by mouth daily.     citalopram (CELEXA) 20 MG tablet Take 1 tablet (20 mg total) by mouth daily. 90 tablet 3   fluticasone (FLONASE) 50 MCG/ACT nasal spray Place 2 sprays into both nostrils daily. 16 g 6   metoprolol succinate (TOPROL-XL) 50 MG 24 hr tablet TAKE 1 TABLET BY MOUTH ONCE DAILY WITH OR  IMMEDIATELY FOLLOWING A MEAL 30 tablet 1   Multiple Vitamin (MULTIVITAMIN) tablet Take 1 tablet by mouth daily.     nortriptyline (PAMELOR) 10 MG capsule Take 1 capsule (10 mg total) by mouth at bedtime. 45 capsule 1   Olmesartan-amLODIPine-HCTZ 40-5-25 MG TABS Take 1 tablet by mouth daily. 90 tablet 2   oxybutynin (DITROPAN XL) 15 MG 24 hr tablet Take 1 tablet (15 mg total) by mouth at bedtime. 90 tablet 1   pantoprazole (PROTONIX) 40 MG tablet Take 1 tablet by mouth once daily 90 tablet 0   rosuvastatin (CRESTOR) 10 MG tablet Take 1 tablet (10 mg total) by mouth daily. 90 tablet 3   albuterol (VENTOLIN HFA) 108 (90 Base) MCG/ACT inhaler Inhale 2 puffs into the lungs every 6 (six) hours as needed for wheezing or shortness of breath. (Patient not taking: Reported on 10/09/2022) 18 g 4   hydrOXYzine (ATARAX) 25 MG tablet Take 0.5 tablets (12.5 mg total) by mouth 3 (three) times daily as needed for anxiety. (Patient not taking: Reported on 10/09/2022) 90 tablet 2   nitroGLYCERIN (NITROSTAT) 0.4 MG SL tablet DISSOLVE ONE TABLET UNDER THE TONGUE EVERY 5 MINUTES AS NEEDED FOR CHEST PAIN.  DO NOT EXCEED A TOTAL OF 3 DOSES IN 15 MINUTES (Patient not taking: Reported on 10/09/2022) 25 tablet 0   No current facility-administered medications on file prior to visit.    No Known Allergies   DIAGNOSTIC DATA (LABS, IMAGING, TESTING) - I reviewed patient records, labs, notes, testing and imaging myself where available.  Lab Results  Component Value Date   WBC 4.7 10/28/2021   HGB 12.2 10/28/2021   HCT 36.7 10/28/2021   MCV 90.7 10/28/2021   PLT 238.0 10/28/2021  Component Value Date/Time   NA 139 07/15/2022 1404   NA 144 08/29/2019 1054   K 4.0 07/15/2022 1404   CL 101 07/15/2022 1404   CO2 30 07/15/2022 1404   GLUCOSE 79 07/15/2022 1404   BUN 18 07/15/2022 1404   BUN 15 08/29/2019 1054   CREATININE 1.09 07/15/2022 1404   CALCIUM 9.7 07/15/2022 1404   PROT 7.1 07/15/2022 1404   PROT 6.8  08/29/2019 1054   ALBUMIN 4.1 07/15/2022 1404   ALBUMIN 4.0 08/29/2019 1054   AST 21 07/15/2022 1404   ALT 21 07/15/2022 1404   ALKPHOS 54 07/15/2022 1404   BILITOT 0.3 07/15/2022 1404   BILITOT 0.2 08/29/2019 1054   GFRNONAA 53 (L) 08/29/2019 1054   GFRAA 61 08/29/2019 1054   Lab Results  Component Value Date   CHOL 166 07/15/2022   HDL 61.90 07/15/2022   LDLCALC 89 07/15/2022   TRIG 72.0 07/15/2022   CHOLHDL 3 07/15/2022   No results found for: "HGBA1C" Lab Results  Component Value Date   VITAMINB12 992 (H) 02/27/2020   Lab Results  Component Value Date   TSH 1.48 10/22/2020    PHYSICAL EXAM:  Today's Vitals   10/09/22 1110  BP: 137/83  Pulse: 71  Weight: 189 lb (85.7 kg)  Height: 5\' 1"  (1.549 m)   Body mass index is 35.71 kg/m.   Wt Readings from Last 3 Encounters:  10/09/22 189 lb (85.7 kg)  09/03/22 194 lb (88 kg)  07/15/22 194 lb 2 oz (88.1 kg)     Ht Readings from Last 3 Encounters:  10/09/22 5\' 1"  (1.549 m)  09/03/22 5\' 1"  (1.549 m)  07/15/22 5\' 1"  (1.549 m)      General: The patient is awake, alert and appears not in acute distress. The patient is well groomed. Head: Normocephalic, atraumatic. Neck is supple.  Goiter.  Mallampati 3,  neck circumference:15 inches . Nasal airflow was patent.  Retrognathia is  seen.  Dental status: poor Cardiovascular:  Regular rate and cardiac rhythm by pulse,  without distended neck veins. Respiratory: Lungs are clear to auscultation.  Skin:  Without evidence of ankle edema, or rash. Trunk: The patient's posture is erect.   NEUROLOGIC EXAM: The patient is awake and alert, oriented to place and time.   Memory subjective described as intact.  Attention span & concentration ability appears normal.  Speech is fluent,  without  dysarthria, dysphonia or aphasia.  Mood and affect are appropriate.   Cranial nerves: no loss of smell or taste reported  Pupils are equal and briskly reactive to light. Funduscopic  exam deferred.   Extraocular movements in vertical and horizontal planes were intact and without nystagmus. No Diplopia. Visual fields by finger perimetry are intact. Hearing was intact to soft voice and finger rubbing.    Facial sensation intact to fine touch.  Facial motor strength is symmetric and tongue and uvula move midline.  Neck ROM : rotation, tilt and flexion extension were normal for age and shoulder shrug was symmetrical.    Motor exam:  Symmetric bulk, tone and ROM.   Normal tone without cog wheeling, symmetric grip strength . She has pain when she bends down and comes back up-    Sensory:  Fine touch, pinprick and vibration were tested  and  normal.  Proprioception tested in the upper extremities was normal.   Coordination: Rapid alternating movements in the fingers/hands were of normal speed.  The Finger-to-nose maneuver was intact without evidence of ataxia,  dysmetria or tremor.   Gait and station: Patient could rise unassisted from a seated position, walked without assistive device. She didn't need to brace.  Stance is of normal width/ base . Deep tendon reflexes: in the  upper and lower extremities are symmetric and intact.  Babinski response was deferred.    ASSESSMENT AND PLAN 72 y.o.  female  here referred by PCP for Sleep Medicine consult:    1)  high risk of OSA,  witnessed snoring , apnea and preferred supine sleep position, BMI is 35, she wakes herself snoring , too.   2) chronic multifactorial insomnia, sleep fragmentation from nightmares,  sometimes knee pain, and sometimes Nocturia. Currently not struggling with GERD.   PS :  fall on her face - lost front teeth, history of closed head injury 12-17-21 with post-concussive symptoms that have now abated.    I will screen for sleep apnea by HST as we have no available in lab appointments before October 2024, this will only address sleep apnea, and cannot detect cardiac arrhythmia or RLS, or REM BD.  I plan  to follow up either personally or through our NP within 4-6 months.   I would like to thank Sharlene Dory, DO  for allowing me to meet with and to take care of this pleasant patient.    After spending a total time of  40  minutes face to face and additional time for physical and neurologic examination, review of laboratory studies,  personal review of imaging studies, reports and results of other testing and review of referral information / records as far as provided in visit,   Electronically signed by: Melvyn Novas, MD 10/09/2022 11:27 AM  [facility] Board certified by The [employer] of Sleep Medicine and Diplomate of the [employer] of Sleep Medicine. Board certified In Neurology through the ABPN, Fellow of the [employer] of Neurology.

## 2022-10-09 NOTE — Patient Instructions (Signed)
Insomnia Insomnia is a sleep disorder that makes it difficult to fall asleep or stay asleep. Insomnia can cause fatigue, low energy, difficulty concentrating, mood swings, and poor performance at work or school. There are three different ways to classify insomnia: Difficulty falling asleep. Difficulty staying asleep. Waking up too early in the morning. Any type of insomnia can be long-term (chronic) or short-term (acute). Both are common. Short-term insomnia usually lasts for 3 months or less. Chronic insomnia occurs at least three times a week for longer than 3 months. What are the causes? Insomnia may be caused by another condition, situation, or substance, such as: Having certain mental health conditions, such as anxiety and depression. Using caffeine, alcohol, tobacco, or drugs. Having gastrointestinal conditions, such as gastroesophageal reflux disease (GERD). Having certain medical conditions. These include: Asthma. Alzheimer's disease. Stroke. Chronic pain. An overactive thyroid gland (hyperthyroidism). Other sleep disorders, such as restless legs syndrome and sleep apnea. Menopause. Sometimes, the cause of insomnia may not be known. What increases the risk? Risk factors for insomnia include: Gender. Females are affected more often than males. Age. Insomnia is more common as people get older. Stress and certain medical and mental health conditions. Lack of exercise. Having an irregular work schedule. This may include working night shifts and traveling between different time zones. What are the signs or symptoms? If you have insomnia, the main symptom is having trouble falling asleep or having trouble staying asleep. This may lead to other symptoms, such as: Feeling tired or having low energy. Feeling nervous about going to sleep. Not feeling rested in the morning. Having trouble concentrating. Feeling irritable, anxious, or depressed. How is this diagnosed? This  condition may be diagnosed based on: Your symptoms and medical history. Your health care provider may ask about: Your sleep habits. Any medical conditions you have. Your mental health. A physical exam. How is this treated? Treatment for insomnia depends on the cause. Treatment may focus on treating an underlying condition that is causing the insomnia. Treatment may also include: Medicines to help you sleep. Counseling or therapy. Lifestyle adjustments to help you sleep better. Follow these instructions at home: Eating and drinking  Limit or avoid alcohol, caffeinated beverages, and products that contain nicotine and tobacco, especially close to bedtime. These can disrupt your sleep. Do not eat a large meal or eat spicy foods right before bedtime. This can lead to digestive discomfort that can make it hard for you to sleep. Sleep habits  Keep a sleep diary to help you and your health care provider figure out what could be causing your insomnia. Write down: When you sleep. When you wake up during the night. How well you sleep and how rested you feel the next day. Any side effects of medicines you are taking. What you eat and drink. Make your bedroom a dark, comfortable place where it is easy to fall asleep. Put up shades or blackout curtains to block light from outside. Use a white noise machine to block noise. Keep the temperature cool. Limit screen use before bedtime. This includes: Not watching TV. Not using your smartphone, tablet, or computer. Stick to a routine that includes going to bed and waking up at the same times every day and night. This can help you fall asleep faster. Consider making a quiet activity, such as reading, part of your nighttime routine. Try to avoid taking naps during the day so that you sleep better at night. Get out of bed if you are still awake  after 15 minutes of trying to sleep. Keep the lights down, but try reading or doing a quiet activity. When you  feel sleepy, go back to bed. General instructions Take over-the-counter and prescription medicines only as told by your health care provider. Exercise regularly as told by your health care provider. However, avoid exercising in the hours right before bedtime. Use relaxation techniques to manage stress. Ask your health care provider to suggest some techniques that may work well for you. These may include: Breathing exercises. Routines to release muscle tension. Visualizing peaceful scenes. Make sure that you drive carefully. Do not drive if you feel very sleepy. Keep all follow-up visits. This is important. Contact a health care provider if: You are tired throughout the day. You have trouble in your daily routine due to sleepiness. You continue to have sleep problems, or your sleep problems get worse. Get help right away if: You have thoughts about hurting yourself or someone else. Get help right away if you feel like you may hurt yourself or others, or have thoughts about taking your own life. Go to your nearest emergency room or: Call 911. Call the National Suicide Prevention Lifeline at 319-606-0667 or 988. This is open 24 hours a day. Text the Crisis Text Line at 956-597-3706. Summary Insomnia is a sleep disorder that makes it difficult to fall asleep or stay asleep. Insomnia can be long-term (chronic) or short-term (acute). Treatment for insomnia depends on the cause. Treatment may focus on treating an underlying condition that is causing the insomnia. Keep a sleep diary to help you and your health care provider figure out what could be causing your insomnia. This information is not intended to replace advice given to you by your health care provider. Make sure you discuss any questions you have with your health care provider. Document Revised: 02/18/2021 Document Reviewed: 02/18/2021 Elsevier Patient Education  2024 Elsevier Inc. Quality Sleep Information, Adult Quality sleep is  important for your mental and physical health. It also improves your quality of life. Quality sleep means you: Are asleep for most of the time you are in bed. Fall asleep within 30 minutes. Wake up no more than once a night. Are awake for no longer than 20 minutes if you do wake up during the night. Most adults need 7-8 hours of quality sleep each night. How can poor sleep affect me? If you do not get enough quality sleep, you may have: Mood swings. Daytime sleepiness. Decreased alertness, reaction time, and concentration. Sleep disorders, such as insomnia and sleep apnea. Difficulty with: Solving problems. Coping with stress. Paying attention. These issues may affect your performance and productivity at work, school, and home. Lack of sleep may also put you at higher risk for accidents, suicide, and risky behaviors. If you do not get quality sleep, you may also be at higher risk for several health problems, including: Infections. Type 2 diabetes. Heart disease. High blood pressure. Obesity. Worsening of long-term conditions, like arthritis, kidney disease, depression, Parkinson's disease, and epilepsy. What actions can I take to get more quality sleep? Sleep schedule and routine Stick to a sleep schedule. Go to sleep and wake up at about the same time each day. Do not try to sleep less on weekdays and make up for lost sleep on weekends. This does not work. Limit naps during the day to 30 minutes or less. Do not take naps in the late afternoon. Make time to relax before bed. Reading, listening to music, or taking a hot  bath promotes quality sleep. Make your bedroom a place that promotes quality sleep. Keep your bedroom dark, quiet, and at a comfortable room temperature. Make sure your bed is comfortable. Avoid using electronic devices that give off bright blue light for 30 minutes before bedtime. Your brain perceives bright blue light as sunlight. This includes television, phones, and  computers. If you are lying awake in bed for longer than 20 minutes, get up and do a relaxing activity until you feel sleepy. Lifestyle     Try to get at least 30 minutes of exercise on most days. Do not exercise 2-3 hours before going to bed. Do not use any products that contain nicotine or tobacco. These products include cigarettes, chewing tobacco, and vaping devices, such as e-cigarettes. If you need help quitting, ask your health care provider. Do not drink caffeinated beverages for at least 8 hours before going to bed. Coffee, tea, and some sodas contain caffeine. Do not drink alcohol or eat large meals close to bedtime. Try to get at least 30 minutes of sunlight every day. Morning sunlight is best. Medical concerns Work with your health care provider to treat medical conditions that may affect sleeping, such as: Nasal obstruction. Snoring. Sleep apnea and other sleep disorders. Talk to your health care provider if you think any of your prescription medicines may cause you to have difficulty falling or staying asleep. If you have sleep problems, talk with a sleep consultant. If you think you have a sleep disorder, talk with your health care provider about getting evaluated by a specialist. Where to find more information Sleep Foundation: sleepfoundation.org American Academy of Sleep Medicine: aasm.org Centers for Disease Control and Prevention (CDC): TonerPromos.no Contact a health care provider if: You have trouble getting to sleep or staying asleep. You often wake up very early in the morning and cannot get back to sleep. You have daytime sleepiness. You have daytime sleep attacks of suddenly falling asleep and sudden muscle weakness (narcolepsy). You have a tingling sensation in your legs with a strong urge to move your legs (restless legs syndrome). You stop breathing briefly during sleep (sleep apnea). You think you have a sleep disorder or are taking a medicine that is affecting  your quality of sleep. Summary Most adults need 7-8 hours of quality sleep each night. Getting enough quality sleep is important for your mental and physical health. Make your bedroom a place that promotes quality sleep, and avoid things that may cause you to have poor sleep, such as alcohol, caffeine, smoking, or large meals. Talk to your health care provider if you have trouble falling asleep or staying asleep. This information is not intended to replace advice given to you by your health care provider. Make sure you discuss any questions you have with your health care provider. Document Revised: 07/03/2021 Document Reviewed: 07/03/2021 Elsevier Patient Education  2024 Elsevier Inc. ASSESSMENT AND PLAN 72 y.o.  female  here referred by PCP for Sleep Medicine consult:    1)  high risk of OSA,  witnessed snoring , apnea and preferred supine sleep position, BMI is 35, she wakes herself snoring , too.   2) chronic multifactorial insomnia, sleep fragmentation from nightmares,  sometimes knee pain, and sometimes Nocturia. Currently not struggling with GERD.   PS :  fall on her face - lost front teeth, history of closed head injury 12-17-21 with post-concussive symptoms that have now abated.    I will screen for sleep apnea by HST as we have  no available in lab appointments before October 2024, this will only address sleep apnea, and cannot detect cardiac arrhythmia or RLS, or REM BD.  I plan to follow up either personally or through our NP within 4-6 months.

## 2022-10-11 DIAGNOSIS — M5136 Other intervertebral disc degeneration, lumbar region: Secondary | ICD-10-CM | POA: Diagnosis not present

## 2022-10-11 DIAGNOSIS — Z79899 Other long term (current) drug therapy: Secondary | ICD-10-CM | POA: Diagnosis not present

## 2022-10-11 DIAGNOSIS — M1811 Unilateral primary osteoarthritis of first carpometacarpal joint, right hand: Secondary | ICD-10-CM | POA: Diagnosis not present

## 2022-10-11 DIAGNOSIS — N182 Chronic kidney disease, stage 2 (mild): Secondary | ICD-10-CM | POA: Diagnosis not present

## 2022-10-11 DIAGNOSIS — M25531 Pain in right wrist: Secondary | ICD-10-CM | POA: Diagnosis not present

## 2022-10-11 DIAGNOSIS — I129 Hypertensive chronic kidney disease with stage 1 through stage 4 chronic kidney disease, or unspecified chronic kidney disease: Secondary | ICD-10-CM | POA: Diagnosis not present

## 2022-10-11 DIAGNOSIS — Z7982 Long term (current) use of aspirin: Secondary | ICD-10-CM | POA: Diagnosis not present

## 2022-10-11 DIAGNOSIS — G8929 Other chronic pain: Secondary | ICD-10-CM | POA: Diagnosis not present

## 2022-10-11 DIAGNOSIS — K760 Fatty (change of) liver, not elsewhere classified: Secondary | ICD-10-CM | POA: Diagnosis not present

## 2022-10-11 DIAGNOSIS — Z87891 Personal history of nicotine dependence: Secondary | ICD-10-CM | POA: Diagnosis not present

## 2022-10-11 DIAGNOSIS — K219 Gastro-esophageal reflux disease without esophagitis: Secondary | ICD-10-CM | POA: Diagnosis not present

## 2022-10-11 DIAGNOSIS — M19041 Primary osteoarthritis, right hand: Secondary | ICD-10-CM | POA: Diagnosis not present

## 2022-10-11 DIAGNOSIS — M1990 Unspecified osteoarthritis, unspecified site: Secondary | ICD-10-CM | POA: Diagnosis not present

## 2022-10-11 DIAGNOSIS — E7800 Pure hypercholesterolemia, unspecified: Secondary | ICD-10-CM | POA: Diagnosis not present

## 2022-10-12 ENCOUNTER — Other Ambulatory Visit: Payer: Self-pay | Admitting: Family Medicine

## 2022-10-12 DIAGNOSIS — K219 Gastro-esophageal reflux disease without esophagitis: Secondary | ICD-10-CM

## 2022-10-20 DIAGNOSIS — E041 Nontoxic single thyroid nodule: Secondary | ICD-10-CM | POA: Diagnosis not present

## 2022-10-20 DIAGNOSIS — E042 Nontoxic multinodular goiter: Secondary | ICD-10-CM | POA: Diagnosis not present

## 2022-10-23 ENCOUNTER — Telehealth: Payer: Self-pay | Admitting: Neurology

## 2022-10-23 NOTE — Telephone Encounter (Signed)
10/13/22 LVM [place] 10/09/22 [employer] medicare no auth req EE

## 2022-10-27 NOTE — Telephone Encounter (Signed)
Patient returned call to schedule HST.

## 2022-10-31 DIAGNOSIS — E042 Nontoxic multinodular goiter: Secondary | ICD-10-CM | POA: Diagnosis not present

## 2022-11-09 ENCOUNTER — Other Ambulatory Visit: Payer: Self-pay | Admitting: Family Medicine

## 2022-11-10 MED ORDER — NITROGLYCERIN 0.4 MG SL SUBL: 0.4000 mg | SUBLINGUAL_TABLET | SUBLINGUAL | 0 refills | Status: DC | PRN

## 2022-11-22 ENCOUNTER — Encounter: Payer: Self-pay | Admitting: Family Medicine

## 2022-11-25 ENCOUNTER — Other Ambulatory Visit: Payer: Self-pay | Admitting: Family Medicine

## 2022-11-25 DIAGNOSIS — E041 Nontoxic single thyroid nodule: Secondary | ICD-10-CM

## 2022-12-15 ENCOUNTER — Ambulatory Visit (HOSPITAL_BASED_OUTPATIENT_CLINIC_OR_DEPARTMENT_OTHER)
Admission: RE | Admit: 2022-12-15 | Discharge: 2022-12-15 | Disposition: A | Discharge status: Home / Self Care | Payer: Medicare Other | Source: Ambulatory Visit | Attending: Family Medicine | Admitting: Family Medicine

## 2022-12-15 DIAGNOSIS — E041 Nontoxic single thyroid nodule: Secondary | ICD-10-CM | POA: Diagnosis not present

## 2022-12-15 DIAGNOSIS — E042 Nontoxic multinodular goiter: Secondary | ICD-10-CM | POA: Diagnosis not present

## 2022-12-16 ENCOUNTER — Ambulatory Visit (INDEPENDENT_AMBULATORY_CARE_PROVIDER_SITE_OTHER): Payer: Medicare Other | Admitting: Neurology

## 2022-12-16 DIAGNOSIS — G4700 Insomnia, unspecified: Secondary | ICD-10-CM

## 2022-12-16 DIAGNOSIS — G478 Other sleep disorders: Secondary | ICD-10-CM

## 2022-12-16 DIAGNOSIS — R0683 Snoring: Secondary | ICD-10-CM

## 2022-12-16 DIAGNOSIS — G4733 Obstructive sleep apnea (adult) (pediatric): Secondary | ICD-10-CM

## 2022-12-16 DIAGNOSIS — R351 Nocturia: Secondary | ICD-10-CM

## 2022-12-17 NOTE — Progress Notes (Signed)
[facility]   HOME SLEEP TEST REPORT ( by Watch PAT)   STUDY DATE:  12-17-2022  Cynthia Montgomery 72 year old female [birth year]    ORDERING CLINICIAN: Melvyn Novas, MD  REFERRING CLINICIAN: Dr Carmelia Roller, DO   CLINICAL INFORMATION/HISTORY: 10-09-2022 , thyroid nodules, insomnia, possible sleep apnea: Cynthia Montgomery is a 72 y.o. female patient who is seen upon PCP referral on 10/09/2022  for a SLEEP MEDICINE EVALUATION:States she snores, apneic events were noted by her visiting sister, with whom she shared a bedroom- reportedly only sleeps 4-5 hours a night. She has chronic insomnia, wakes up and stays awake for hours in the middle of the night, Fragmented sleep. She reports frequent vivid dreams, often nightmarish, sometimes waking her up.  She has 2 nocturia breaks at night, used to be more , but reduced on a Ditropan medication. Sleep talker, not walker.  STOP BANG of 4.      Epworth sleepiness score: 15/24. FSS at 40/ 63 points.  GDS : na    BMI: 35. 7  kg/m   Neck Circumference: 15"   FINDINGS:   Sleep Summary:   Total Recording Time (hours, min): 6 hours 18 minutes       Total Sleep Time (hours, min):   5 hours 35 minutes              Percent REM (%): 35%                                      Respiratory Indices by AASM criteria:   Calculated pAHI (per hour):       25.2/h, and by CMS criteria 13.5/h                      REM pAHI:       56.4/h, and by CMS criteria 32/h                                          NREM pAHI: 8.8/h and by CMS criteria 3.9/h                          Positional AHI:   The patient slept almost exclusively in supine position associated with an AHI of 25.5 or CMS related AHI of 13.5/h.  Snoring reached barely the threshold of detection with a mean volume of 40 dB.                                                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  There was no clinically significant degree of hypoxia noted, heart rate variability was in normal limits.  Snoring was not detected.   RECOMMENDATION: This is a strongly REM sleep dependent apnea and the treatment of choice is positive airway pressure.  A dental device or hypoglossal nerve stimulator will treat snoring and reduce apnea but REM sleep apnea is usually untouched under these therapies.  I will order an auto titration CPAP device with a setting between 6 and 16 cm water pressure at 2 cm EPR, heated humidification and an interface to be fitted to the patient while in reclined position.    INTERPRETING PHYSICIAN:   Melvyn Novas, MD

## 2022-12-21 NOTE — Progress Notes (Unsigned)
Cardiology Office Note:    Date:  12/22/2022   ID:  Cynthia Montgomery, DOB 12-10-1950, MRN [id number]  PCP:  Sharlene Dory, DO  Cardiologist:  Norman Herrlich, MD    Referring MD: Sharlene Dory*    ASSESSMENT:    1. Mild CAD   2. Essential hypertension   3. RBBB (right bundle branch block)   4. Mixed hyperlipidemia    PLAN:    In order of problems listed above:  She continues to do well she has mild nonobstructive CAD she is on good foundational medical therapy including aspirin beta-blocker antihypertensive and her high intensity statin is having no anginal discomfort.  Continue her current medications Blood pressure at target I challenged her to check her home blood pressure several times a week record and bring to medical interactions in the future and continue her treatment including ARB calcium channel blocker Stable EKG pattern LDL at target continue high intensity statin with mild nonobstructive CAD   Next appointment: 1 year   Medication Adjustments/Labs and Tests Ordered: Current medicines are reviewed at length with the patient today.  Concerns regarding medicines are outlined above.  Orders Placed This Encounter  Procedures   EKG 12-Lead   No orders of the defined types were placed in this encounter.    History of Present Illness:    Cynthia Montgomery is a 72 y.o. female with a hx of mild nonobstructive CAD right bundle branch block hypertension and hyperlipidemia last seen 10/11/2021. Cardiac CTA performed 09/21/2018 showed a calcium score of 50 /67th percentile and mild nonobstructive CAD less than 25% in the proximal left anterior descending coronary artery .  Compliance with diet, lifestyle and medications: Yes  Is a smart watch but is not using it we will give her information to set it up and have challenged her to follow her steps goal 6500 today She has had no chest pain edema shortness of breath palpitation or syncope She tolerates her  statin without muscle pain or weakness.  Lipid profile in April showed an LDL of 89 cholesterol 166 hemoglobin 12.2 creatinine 1.09 potassium 4.0 Past Medical History:  Diagnosis Date   Abnormal myocardial perfusion study 07/22/2018   Acute pharyngitis 09/26/2017   Adiposity 09/26/2013   AKI (acute kidney injury) 01/24/2017   Arthrofibrosis of knee joint, right 10/11/2013   Asthma    Chronic back pain 08/26/2019   Edema, unspecified 09/26/2013   Essential hypertension    Dx 30 ys ago; normal BP 130/60 (with med compliance)   Exertional chest pain 07/22/2018   Facet arthropathy, lumbosacral 06/15/2015   Gastro-esophageal reflux disease without esophagitis 09/26/2013   Gonalgia 09/26/2013   Heart murmur 02/27/2016   History of cataracts    bilateral; s/p surgery   History of colon polyps    Hyperlipidemia 04/23/2020   Hypokalemia 09/26/2013   Intervertebral disc degeneration, 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   Left knee pain 04/06/2007   Left thyroid nodule 10/28/2017   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   Nonintractable episodic headache 07/27/2017   Nuclear sclerotic cataract of left eye 07/13/2014   OA (osteoarthritis) of knee    s/p left TKR 09/2010   OAB (overactive bladder) 07/19/2019   Obesity, Class II, BMI 35-39.9 08/26/2019   Positive TB test    Primary osteoarthritis of right knee 07/19/2013   Pure hypercholesterolemia 04/06/2007   Annotation: reports a history, and that she was on a medication for it   RBBB (right bundle branch block) 07/22/2018   S/P  total knee replacement, right 08/26/2016   Snoring 08/26/2019   STOP BANG 4   Syncope 01/24/2017   Transaminitis 01/24/2017   Xerostomia 10/28/2017    Current Medications: Current Meds  Medication Sig   albuterol (VENTOLIN HFA) 108 (90 Base) MCG/ACT inhaler Inhale 2 puffs into the lungs every 6 (six) hours as needed for wheezing or shortness of breath.   aspirin EC 81 MG tablet Take 81 mg by mouth daily.   baclofen (LIORESAL)  10 MG tablet Take 1 tablet (10 mg total) by mouth 3 (three) times daily.   cholecalciferol (VITAMIN D) 400 units TABS tablet Take 400 Units by mouth daily.   citalopram (CELEXA) 20 MG tablet Take 1 tablet (20 mg total) by mouth daily.   fluticasone (FLONASE) 50 MCG/ACT nasal spray Place 2 sprays into both nostrils daily.   hydrOXYzine (ATARAX) 25 MG tablet Take 0.5 tablets (12.5 mg total) by mouth 3 (three) times daily as needed for anxiety.   metoprolol succinate (TOPROL-XL) 50 MG 24 hr tablet TAKE 1 TABLET BY MOUTH ONCE DAILY WITH OR IMMEDIATELY FOLLOWING A MEAL   Multiple Vitamin (MULTIVITAMIN) tablet Take 1 tablet by mouth daily.   nitroGLYCERIN (NITROSTAT) 0.4 MG SL tablet Place 1 tablet (0.4 mg total) under the tongue every 5 (five) minutes as needed for chest pain.   nortriptyline (PAMELOR) 10 MG capsule Take 1 capsule (10 mg total) by mouth at bedtime.   Olmesartan-amLODIPine-HCTZ 40-5-25 MG TABS Take 1 tablet by mouth daily.   oxybutynin (DITROPAN XL) 15 MG 24 hr tablet Take 1 tablet (15 mg total) by mouth at bedtime.   pantoprazole (PROTONIX) 40 MG tablet Take 1 tablet by mouth once daily   rosuvastatin (CRESTOR) 10 MG tablet Take 1 tablet (10 mg total) by mouth daily.      EKGs/Labs/Other Studies Reviewed:    The following studies were reviewed today:  Cardiac Studies & Procedures          CT SCANS  CT CORONARY MORPH W/CTA COR W/SCORE 09/21/2018  Addendum 09/21/2018  2:57 PM ADDENDUM REPORT: 09/21/2018 14:54  CLINICAL DATA:  Chest pain  EXAM: Cardiac CTA  MEDICATIONS: Sub lingual nitro. 4 mg and lopressor 5mg   TECHNIQUE: The patient was scanned on a [employer] 192 scanner. Gantry rotation speed was 250 msecs. Collimation was. 6 mm . A 120 kV prospective scan was triggered in the ascending thoracic aorta at 140 HU's with full mA between 30-70% of the R-R interval . Average HR during the scan was 53 bpm. The 3D data set was interpreted on a dedicated work station  using MPR, MIP and VRT modes. A total of 80 cc of contrast was used.  FINDINGS: Non-cardiac: See separate report from [facility]. No significant findings on limited lung and soft tissue windows.  Calcium score: Calcium noted in proximal LAD  Coronary Arteries: Right dominant with no anomalies  LM: Normal  LAD: 1-24% calcified plaque in proximal LAD  D1: Normal  D2: Normal  Circumflex: Normal  OM1: Normal  OM2: Normal  RCA: Normal  PDA: Normal  PLA: Normal  IMPRESSION: 1. Calcium score 50 isolated to proximal LAD this is 57 th percentile for age and sex  2.  Normal aortic root 3.6 cm  3.  CAD RADS 1 non obstructive CAD in proximal LAD  Charlton Haws   Electronically Signed By: Charlton Haws M.D. On: 09/21/2018 14:54  Narrative EXAM: OVER-READ INTERPRETATION  CT CHEST  The following report is an over-read performed  by radiologist Dr. Signa Kell of [facility] on 09/21/2018. This over-read does not include interpretation of cardiac or coronary anatomy or pathology. The coronary calcium score/coronary CTA interpretation by the cardiologist is attached.  COMPARISON:  None  FINDINGS: Vascular: Cardiac enlargement.  No pericardial effusion.  Mediastinum/Nodes: No enlarged mediastinal or axillary lymph nodes. Thyroid gland, trachea, and esophagus demonstrate no significant findings.  Lungs/Pleura: No pleural effusion identified. No airspace consolidation. Scar identified within the lingula and medial right lower lobe.  Upper Abdomen: No acute abnormality.  Musculoskeletal: Spondylosis within the thoracic spine. No aggressive lytic or sclerotic bone lesions.  IMPRESSION: 1. No active cardiopulmonary abnormalities. 2. Cardiac enlargement.  Electronically Signed: By: Signa Kell M.D. On: 09/21/2018 14:35          EKG Interpretation Date/Time:  Monday December 22 2022 13:00:04 EDT Ventricular Rate:  59 PR  Interval:  252 QRS Duration:  114 QT Interval:  440 QTC Calculation: 435 R Axis:   -50  Text Interpretation: Sinus bradycardia with 1st degree A-V block Right bundle branch block Incomplete right bundle branch block Left anterior fascicular block Bifascicular block Cannot rule out Anterior infarct , age undetermined Confirmed by Norman Herrlich ([id number]) on 12/22/2022 1:03:37 PM   Recent Labs: 07/15/2022: ALT 21; BUN 18; Creatinine, Ser 1.09; Potassium 4.0; Sodium 139  Recent Lipid Panel    Component Value Date/Time   CHOL 166 07/15/2022 1404   CHOL 170 08/29/2019 1054   TRIG 72.0 07/15/2022 1404   HDL 61.90 07/15/2022 1404   HDL 68 08/29/2019 1054   CHOLHDL 3 07/15/2022 1404   VLDL 14.4 07/15/2022 1404   LDLCALC 89 07/15/2022 1404   LDLCALC 85 08/29/2019 1054    Physical Exam:    VS:  BP 136/82 (BP Location: Left Arm, Patient Position: Sitting, Cuff Size: Normal)   Pulse (!) 59   Ht 5\' 2"  (1.575 m)   Wt 195 lb (88.5 kg)   SpO2 91%   BMI 35.67 kg/m     Wt Readings from Last 3 Encounters:  12/22/22 195 lb (88.5 kg)  10/09/22 189 lb (85.7 kg)  09/03/22 194 lb (88 kg)     GEN:  Well nourished, well developed in no acute distress HEENT: Normal NECK: No JVD; No carotid bruits LYMPHATICS: No lymphadenopathy CARDIAC: RRR, no murmurs, rubs, gallops RESPIRATORY:  Clear to auscultation without rales, wheezing or rhonchi  ABDOMEN: Soft, non-tender, non-distended MUSCULOSKELETAL:  No edema; No deformity  SKIN: Warm and dry NEUROLOGIC:  Alert and oriented x 3 PSYCHIATRIC:  Normal affect    Signed, Norman Herrlich, MD  12/22/2022 1:18 PM    [facility]

## 2022-12-22 ENCOUNTER — Encounter: Payer: Self-pay | Admitting: Cardiology

## 2022-12-22 ENCOUNTER — Ambulatory Visit: Payer: Medicare Other | Attending: Cardiology | Admitting: Cardiology

## 2022-12-22 VITALS — BP 136/82 | HR 59 | Ht 62.0 in | Wt 195.0 lb

## 2022-12-22 DIAGNOSIS — I2510 Atherosclerotic heart disease of native coronary artery without angina pectoris: Secondary | ICD-10-CM

## 2022-12-22 DIAGNOSIS — I4510 Unspecified right bundle-branch block: Secondary | ICD-10-CM | POA: Diagnosis not present

## 2022-12-22 DIAGNOSIS — G478 Other sleep disorders: Secondary | ICD-10-CM | POA: Insufficient documentation

## 2022-12-22 DIAGNOSIS — I10 Essential (primary) hypertension: Secondary | ICD-10-CM | POA: Diagnosis not present

## 2022-12-22 DIAGNOSIS — E782 Mixed hyperlipidemia: Secondary | ICD-10-CM

## 2022-12-22 DIAGNOSIS — G4700 Insomnia, unspecified: Secondary | ICD-10-CM | POA: Insufficient documentation

## 2022-12-22 HISTORY — DX: Other sleep disorders: G47.8

## 2022-12-22 NOTE — Patient Instructions (Addendum)
Medication Instructions:  Your physician recommends that you continue on your current medications as directed. Please refer to the Current Medication list given to you today.  *If you need a refill on your cardiac medications before your next appointment, please call your pharmacy*   Lab Work: None If you have labs (blood work) drawn today and your tests are completely normal, you will receive your results only by: MyChart Message (if you have MyChart) OR A paper copy in the mail If you have any lab test that is abnormal or we need to change your treatment, we will call you to review the results.   Testing/Procedures: None   Follow-Up: At [facility], you and your health needs are our priority.  As part of our continuing mission to provide you with exceptional heart care, we have created designated Provider Care Teams.  These Care Teams include your primary Cardiologist (physician) and Advanced Practice Providers (APPs -  Physician Assistants and Nurse Practitioners) who all work together to provide you with the care you need, when you need it.  We recommend signing up for the patient portal called "MyChart".  Sign up information is provided on this After Visit Summary.  MyChart is used to connect with patients for Virtual Visits (Telemedicine).  Patients are able to view lab/test results, encounter notes, upcoming appointments, etc.  Non-urgent messages can be sent to your provider as well.   To learn more about what you can do with MyChart, go to [email or web address].    Your next appointment:   1 year(s)  Provider:   Norman Herrlich, MD    Other Instructions Goal of 6500 steps daily.  Check home blood pressure several times a week and record.  [facility]  Tips to measure your blood pressure correctly  To determine whether you have hypertension, a medical professional will take a blood pressure reading. How you prepare for the test, the position of your arm, and  other factors can change a blood pressure reading by 10% or more. That could be enough to hide high blood pressure, start you on a drug you don't really need, or lead your doctor to incorrectly adjust your medications. National and international guidelines offer specific instructions for measuring blood pressure. If a doctor, nurse, or medical assistant isn't doing it right, don't hesitate to ask him or her to get with the guidelines. Here's what you can do to ensure a correct reading:  Don't drink a caffeinated beverage or smoke during the 30 minutes before the test.  Sit quietly for five minutes before the test begins.  During the measurement, sit in a chair with your feet on the floor and your arm supported so your elbow is at about heart level.  The inflatable part of the cuff should completely cover at least 80% of your upper arm, and the cuff should be placed on bare skin, not over a shirt.  Don't talk during the measurement.  Have your blood pressure measured twice, with a brief break in between. If the readings are different by 5 points or more, have it done a third time. There are times to break these rules. If you sometimes feel lightheaded when getting out of bed in the morning or when you stand after sitting, you should have your blood pressure checked while seated and then while standing to see if it falls from one position to the next. Because blood pressure varies throughout the day, your doctor will rarely diagnose hypertension on the basis of  a single reading. Instead, he or she will want to confirm the measurements on at least two occasions, usually within a few weeks of one another. The exception to this rule is if you have a blood pressure reading of 180/110 mm Hg or higher. A result this high usually calls for prompt treatment. It's also a good idea to have your blood pressure measured in both arms at least once, since the reading in one arm (usually the right) may be higher than that  in the left. A 2014 study in The American Journal of Medicine of nearly 3,400 people found average arm- to-arm differences in systolic blood pressure of about 5 points. The higher number should be used to make treatment decisions. In 2017, new guidelines from the American Heart Association, the [employer] of [employer]iology, and nine other health organizations lowered the diagnosis of high blood pressure to 130/80 mm Hg or higher for all adults. The guidelines also redefined the various blood pressure categories to now include normal, elevated, Stage 1 hypertension, Stage 2 hypertension, and hypertensive crisis (see "Blood pressure categories"). Blood pressure categories  Blood pressure category SYSTOLIC (upper number)  DIASTOLIC (lower number)  Normal Less than 120 mm Hg and Less than 80 mm Hg  Elevated 120-129 mm Hg and Less than 80 mm Hg  High blood pressure: Stage 1 hypertension 130-139 mm Hg or 80-89 mm Hg  High blood pressure: Stage 2 hypertension 140 mm Hg or higher or 90 mm Hg or higher  Hypertensive crisis (consult your doctor immediately) Higher than 180 mm Hg and/or Higher than 120 mm Hg  Source: American Heart Association and American Stroke Association. For more on getting your blood pressure under control, buy Controlling Your Blood Pressure, a Special Health Report from [facility].   DASH diet: Healthy eating to lower your blood pressure The DASH diet emphasizes portion size, eating a variety of foods and getting the right amount of nutrients. Discover how DASH can improve your health and lower your blood pressure. By [facility] Staff  DASH stands for Dietary Approaches to Stop Hypertension. The DASH diet is a lifelong approach to healthy eating that's designed to help treat or prevent high blood pressure (hypertension). The DASH diet encourages you to reduce the sodium in your diet and eat a variety of foods rich in nutrients that help lower blood pressure, such as  potassium, calcium and magnesium. By following the DASH diet, you may be able to reduce your blood pressure by a few points in just two weeks. Over time, your systolic blood pressure could drop by eight to 14 points, which can make a significant difference in your health risks. Because the DASH diet is a healthy way of eating, it offers health benefits besides just lowering blood pressure. The DASH diet is also in line with dietary recommendations to prevent osteoporosis, cancer, heart disease, stroke and diabetes. DASH diet: Sodium levels The DASH diet emphasizes vegetables, fruits and low-fat dairy foods -- and moderate amounts of whole grains, fish, poultry and nuts. In addition to the standard DASH diet, there is also a lower sodium version of the diet. You can choose the version of the diet that meets your health needs: Standard DASH diet. You can consume up to 2,300 milligrams (mg) of sodium a day.  Lower sodium DASH diet. You can consume up to 1,500 mg of sodium a day. Both versions of the DASH diet aim to reduce the amount of sodium in your diet compared  with what you might get in a typical American diet, which can amount to a whopping 3,400 mg of sodium a day or more. The standard DASH diet meets the recommendation from the Dietary Guidelines for Americans to keep daily sodium intake to less than 2,300 mg a day. The American Heart Association recommends 1,500 mg a day of sodium as an upper limit for all adults. If you aren't sure what sodium level is right for you, talk to your doctor. DASH diet: What to eat Both versions of the DASH diet include lots of whole grains, fruits, vegetables and low-fat dairy products. The DASH diet also includes some fish, poultry and legumes, and encourages a small amount of nuts and seeds a few times a week.  You can eat red meat, sweets and fats in small amounts. The DASH diet is low in saturated fat, cholesterol and total fat. Here's a look at the recommended  servings from each food group for the 2,000-calorie-a-day DASH diet. Grains: 6 to 8 servings a day Grains include bread, cereal, rice and pasta. Examples of one serving of grains include 1 slice whole-wheat bread, 1 ounce dry cereal, or 1/2 cup cooked cereal, rice or pasta. Focus on whole grains because they have more fiber and nutrients than do refined grains. For instance, use brown rice instead of white rice, whole-wheat pasta instead of regular pasta and whole-grain bread instead of white bread. Look for products labeled "100 percent whole grain" or "100 percent whole wheat."  Grains are naturally low in fat. Keep them this way by avoiding butter, cream and cheese sauces. Vegetables: 4 to 5 servings a day Tomatoes, carrots, broccoli, sweet potatoes, greens and other vegetables are full of fiber, vitamins, and such minerals as potassium and magnesium. Examples of one serving include 1 cup raw leafy green vegetables or 1/2 cup cut-up raw or cooked vegetables. Don't think of vegetables only as side dishes -- a hearty blend of vegetables served over brown rice or whole-wheat noodles can serve as the main dish for a meal.  Fresh and frozen vegetables are both good choices. When buying frozen and canned vegetables, choose those labeled as low sodium or without added salt.  To increase the number of servings you fit in daily, be creative. In a stir-fry, for instance, cut the amount of meat in half and double up on the vegetables. Fruits: 4 to 5 servings a day Many fruits need little preparation to become a healthy part of a meal or snack. Like vegetables, they're packed with fiber, potassium and magnesium and are typically low in fat -- coconuts are an exception. Examples of one serving include one medium fruit, 1/2 cup fresh, frozen or canned fruit, or 4 ounces of juice. Have a piece of fruit with meals and one as a snack, then round out your day with a dessert of fresh fruits topped with a dollop of  low-fat yogurt.  Leave on edible peels whenever possible. The peels of apples, pears and most fruits with pits add interesting texture to recipes and contain healthy nutrients and fiber.  Remember that citrus fruits and juices, such as grapefruit, can interact with certain medications, so check with your doctor or pharmacist to see if they're OK for you.  If you choose canned fruit or juice, make sure no sugar is added. Dairy: 2 to 3 servings a day Milk, yogurt, cheese and other dairy products are major sources of calcium, vitamin D and protein. But the key is to make  sure that you choose dairy products that are low fat or fat-free because otherwise they can be a major source of fat -- and most of it is saturated. Examples of one serving include 1 cup skim or 1 percent milk, 1 cup low fat yogurt, or 1 1/2 ounces part-skim cheese. Low-fat or fat-free frozen yogurt can help you boost the amount of dairy products you eat while offering a sweet treat. Add fruit for a healthy twist.  If you have trouble digesting dairy products, choose lactose-free products or consider taking an over-the-counter product that contains the enzyme lactase, which can reduce or prevent the symptoms of lactose intolerance.  Go easy on regular and even fat-free cheeses because they are typically high in sodium. Lean meat, poultry and fish: 6 servings or fewer a day Meat can be a rich source of protein, B vitamins, iron and zinc. Choose lean varieties and aim for no more than 6 ounces a day. Cutting back on your meat portion will allow room for more vegetables. Trim away skin and fat from poultry and meat and then bake, broil, grill or roast instead of frying in fat.  Eat heart-healthy fish, such as salmon, herring and tuna. These types of fish are high in omega-3 fatty acids, which can help lower your total cholesterol. Nuts, seeds and legumes: 4 to 5 servings a week Almonds, sunflower seeds, kidney beans, peas, lentils and other  foods in this family are good sources of magnesium, potassium and protein. They're also full of fiber and phytochemicals, which are plant compounds that may protect against some cancers and cardiovascular disease. Serving sizes are small and are intended to be consumed only a few times a week because these foods are high in calories. Examples of one serving include 1/3 cup nuts, 2 tablespoons seeds, or 1/2 cup cooked beans or peas.  Nuts sometimes get a bad rap because of their fat content, but they contain healthy types of fat -- monounsaturated fat and omega-3 fatty acids. They're high in calories, however, so eat them in moderation. Try adding them to stir-fries, salads or cereals.  Soybean-based products, such as tofu and tempeh, can be a good alternative to meat because they contain all of the amino acids your body needs to make a complete protein, just like meat. Fats and oils: 2 to 3 servings a day Fat helps your body absorb essential vitamins and helps your body's immune system. But too much fat increases your risk of heart disease, diabetes and obesity. The DASH diet strives for a healthy balance by limiting total fat to less than 30 percent of daily calories from fat, with a focus on the healthier monounsaturated fats. Examples of one serving include 1 teaspoon soft margarine, 1 tablespoon mayonnaise or 2 tablespoons salad dressing. Saturated fat and trans fat are the main dietary culprits in increasing your risk of coronary artery disease. DASH helps keep your daily saturated fat to less than 6 percent of your total calories by limiting use of meat, butter, cheese, whole milk, cream and eggs in your diet, along with foods made from lard, solid shortenings, and palm and coconut oils.  Avoid trans fat, commonly found in such processed foods as crackers, baked goods and fried items.  Read food labels on margarine and salad dressing so that you can choose those that are lowest in saturated fat and  free of trans fat. Sweets: 5 servings or fewer a week You don't have to banish sweets entirely while following the  DASH diet -- just go easy on them. Examples of one serving include 1 tablespoon sugar, jelly or jam, 1/2 cup sorbet, or 1 cup lemonade. When you eat sweets, choose those that are fat-free or low-fat, such as sorbets, fruit ices, jelly beans, hard candy, graham crackers or low-fat cookies.  Artificial sweeteners such as aspartame (NutraSweet, Equal) and sucralose (Splenda) may help satisfy your sweet tooth while sparing the sugar. But remember that you still must use them sensibly. It's OK to swap a diet cola for a regular cola, but not in place of a more nutritious beverage such as low-fat milk or even plain water.  Cut back on added sugar, which has no nutritional value but can pack on calories. DASH diet: Alcohol and caffeine Drinking too much alcohol can increase blood pressure. The Dietary Guidelines for Americans recommends that men limit alcohol to no more than two drinks a day and women to one or less. The DASH diet doesn't address caffeine consumption. The influence of caffeine on blood pressure remains unclear. But caffeine can cause your blood pressure to rise at least temporarily. If you already have high blood pressure or if you think caffeine is affecting your blood pressure, talk to your doctor about your caffeine consumption. DASH diet and weight loss While the DASH diet is not a weight-loss program, you may indeed lose unwanted pounds because it can help guide you toward healthier food choices. The DASH diet generally includes about 2,000 calories a day. If you're trying to lose weight, you may need to eat fewer calories. You may also need to adjust your serving goals based on your individual circumstances -- something your health care team can help you decide. Tips to cut back on sodium The foods at the core of the DASH diet are naturally low in sodium. So just by following  the DASH diet, you're likely to reduce your sodium intake. You also reduce sodium further by: Using sodium-free spices or flavorings with your food instead of salt  Not adding salt when cooking rice, pasta or hot cereal  Rinsing canned foods to remove some of the sodium  Buying foods labeled "no salt added," "sodium-free," "low sodium" or "very low sodium" One teaspoon of table salt has 2,325 mg of sodium. When you read food labels, you may be surprised at just how much sodium some processed foods contain. Even low-fat soups, canned vegetables, ready-to-eat cereals and sliced [place] from the local deli -- foods you may have considered healthy -- often have lots of sodium. You may notice a difference in taste when you choose low-sodium food and beverages. If things seem too bland, gradually introduce low-sodium foods and cut back on table salt until you reach your sodium goal. That'll give your palate time to adjust. Using salt-free seasoning blends or herbs and spices may also ease the transition. It can take several weeks for your taste buds to get used to less salty foods. Putting the pieces of the DASH diet together Try these strategies to get started on the DASH diet:  Change gradually. If you now eat only one or two servings of fruits or vegetables a day, try to add a serving at lunch and one at dinner. Rather than switching to all whole grains, start by making one or two of your grain servings whole grains. Increasing fruits, vegetables and whole grains gradually can also help prevent bloating or diarrhea that may occur if you aren't used to eating a diet with lots of fiber.  You can also try over-the-counter products to help reduce gas from beans and vegetables.  Reward successes and forgive slip-ups. Reward yourself with a nonfood treat for your accomplishments -- rent a movie, purchase a book or get together with a friend. Everyone slips, especially when learning something new. Remember that  changing your lifestyle is a long-term process. Find out what triggered your setback and then just pick up where you left off with the DASH diet.  Add physical activity. To boost your blood pressure lowering efforts even more, consider increasing your physical activity in addition to following the DASH diet. Combining both the DASH diet and physical activity makes it more likely that you'll reduce your blood pressure.  Get support if you need it. If you're having trouble sticking to your diet, talk to your doctor or dietitian about it. You might get some tips that will help you stick to the DASH diet. Remember, healthy eating isn't an all-or-nothing proposition. What's most important is that, on average, you eat healthier foods with plenty of variety -- both to keep your diet nutritious and to avoid boredom or extremes. And with the DASH diet, you can have both.

## 2022-12-22 NOTE — Procedures (Signed)
[facility]   HOME SLEEP TEST REPORT ( by [facility] PAT)   STUDY DATE:  12-17-2022  [name] 72 year old female [birth year]    ORDERING CLINICIAN: Melvyn Novas, MD  REFERRING CLINICIAN: Dr Carmelia Roller, DO   CLINICAL INFORMATION/HISTORY: 10-09-2022 , thyroid nodules, insomnia, possible sleep apnea: Cynthia Montgomery is a 72 y.o. female patient who is seen upon PCP referral on 10/09/2022  for a SLEEP MEDICINE EVALUATION:States she snores, apneic events were noted by her visiting sister, with whom she shared a bedroom- reportedly only sleeps 4-5 hours a night. She has chronic insomnia, wakes up and stays awake for hours in the middle of the night, Fragmented sleep. She reports frequent vivid dreams, often nightmarish, sometimes waking her up.  She has 2 nocturia breaks at night, used to be more , but reduced on a Ditropan medication. Sleep talker, not walker.  STOP BANG of 4.      Epworth sleepiness score: 15/24. FSS at 40/ 63 points.  GDS : na    BMI: 35. 7  kg/m   Neck Circumference: 15"   FINDINGS:   Sleep Summary:   Total Recording Time (hours, min): 6 hours 18 minutes       Total Sleep Time (hours, min):   5 hours 35 minutes              Percent REM (%): 35%                                      Respiratory Indices by AASM criteria:   Calculated pAHI (per hour):       25.2/h, and by CMS criteria 13.5/h                      REM pAHI:       56.4/h, and by CMS criteria 32/h                                          NREM pAHI: 8.8/h and by CMS criteria 3.9/h                          Positional AHI:   The patient slept almost exclusively in supine position associated with an AHI of 25.5 or CMS related AHI of 13.5/h.  Snoring reached barely the threshold of detection with a mean volume of 40 dB.                                                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  There was no clinically significant degree of hypoxia noted, heart rate variability was in normal limits.  Snoring was not detected.   RECOMMENDATION: This is a strongly REM sleep dependent apnea and the treatment of choice is positive airway pressure.  A dental device or hypoglossal nerve stimulator will treat snoring and reduce apnea but REM sleep apnea is usually untouched under these therapies.  I will order an auto titration CPAP device with a setting between 6 and 16 cm water pressure at 2 cm EPR, heated humidification and an interface to be fitted to the patient while in reclined position.    INTERPRETING PHYSICIAN:   Melvyn Novas, MD

## 2022-12-23 ENCOUNTER — Telehealth: Payer: Self-pay

## 2022-12-23 NOTE — Telephone Encounter (Addendum)
Pt returned call, went over [facility] msg. She agreed to set up. FU appt made with MM 02/25/23 at 230.     Bobbye Morton, CMA  Genevive Bi, Ileene Patrick; Kathyrn Sheriff New orders have been placed for the above pt, DOB: 01/13/1951 Thanks  New, Maryruth Bun, Abbe Amsterdam, CMA; Barnetta Hammersmith; 1 other Received, thank you

## 2022-12-23 NOTE — Telephone Encounter (Signed)
Contacted pt, LVM rq call back  

## 2022-12-23 NOTE — Telephone Encounter (Signed)
-----   Message from [place] D[place]meier sent at 12/22/2022  5:43 PM EDT ----- This is a strongly REM sleep dependent apnea and the treatment of choice is positive airway pressure.  A dental device or hypoglossal nerve stimulator will treat snoring and reduce apnea but REM sleep apnea is usually untouched under these therapies.   I will order an auto titration CPAP device with a setting between 6 and 16 cm water pressure at 2 cm EPR, heated humidification and an interface to be fitted to the patient while in reclined position.   Please note that insomnia may not be successfully treated by treating underlying apnea.

## 2022-12-27 ENCOUNTER — Other Ambulatory Visit: Payer: Self-pay | Admitting: Family Medicine

## 2023-01-19 ENCOUNTER — Encounter: Payer: Medicare Other | Admitting: Family Medicine

## 2023-02-06 ENCOUNTER — Other Ambulatory Visit: Payer: Self-pay | Admitting: Family Medicine

## 2023-02-06 ENCOUNTER — Other Ambulatory Visit: Payer: Self-pay | Admitting: Cardiology

## 2023-02-06 DIAGNOSIS — K219 Gastro-esophageal reflux disease without esophagitis: Secondary | ICD-10-CM

## 2023-02-06 DIAGNOSIS — I10 Essential (primary) hypertension: Secondary | ICD-10-CM

## 2023-02-25 ENCOUNTER — Encounter: Payer: Self-pay | Admitting: Adult Health

## 2023-02-25 ENCOUNTER — Ambulatory Visit: Payer: Medicare Other | Admitting: Adult Health

## 2023-02-25 VITALS — BP 128/78 | HR 71 | Ht 61.0 in | Wt 194.0 lb

## 2023-02-25 DIAGNOSIS — G4733 Obstructive sleep apnea (adult) (pediatric): Secondary | ICD-10-CM | POA: Diagnosis not present

## 2023-03-24 ENCOUNTER — Other Ambulatory Visit: Payer: Self-pay | Admitting: Family Medicine

## 2023-03-24 DIAGNOSIS — F411 Generalized anxiety disorder: Secondary | ICD-10-CM

## 2023-04-21 ENCOUNTER — Ambulatory Visit (INDEPENDENT_AMBULATORY_CARE_PROVIDER_SITE_OTHER): Payer: Medicare Other

## 2023-04-21 VITALS — Ht 62.0 in | Wt 195.0 lb

## 2023-04-21 DIAGNOSIS — Z0000 Encounter for general adult medical examination without abnormal findings: Secondary | ICD-10-CM | POA: Diagnosis not present

## 2023-04-21 NOTE — Progress Notes (Signed)
Medicare  Subjective:   Cynthia Montgomery is a 73 y.o. female who presents for Medicare Annual (Subsequent) preventive examination.  Visit Complete: Virtual I connected with  Cynthia Montgomery on 04/21/23 by a audio enabled telemedicine application and verified that I am speaking with the correct person using two identifiers.  Patient Location: Home  Provider Location: Home Office  I discussed the limitations of evaluation and management by telemedicine. The patient expressed understanding and agreed to proceed.  Vital Signs: Because this visit was a virtual/telehealth visit, some criteria may be missing or patient reported. Any vitals not documented were not able to be obtained and vitals that have been documented are patient reported.  Cardiac Risk Factors include: advanced age (>2men, >75 women);hypertension     Objective:    Today's Vitals   04/21/23 1514  Weight: 195 lb (88.5 kg)  Height: 5\' 2"  (1.575 m)   Body mass index is 35.67 kg/m.     04/21/2023    3:21 PM 04/18/2022    3:41 PM 04/21/2021    9:11 AM 03/26/2021    3:17 PM 05/02/2020    8:52 AM 12/21/2018    8:57 AM 12/14/2017    1:13 PM  Advanced Directives  Does Patient Have a Medical Advance Directive? No No No No No No No  Would patient like information on creating a medical advance directive? No - Patient declined No - Patient declined  No - Patient declined  No - Patient declined No - Patient declined    Current Medications (verified) Outpatient Encounter Medications as of 04/21/2023  Medication Sig   albuterol (VENTOLIN HFA) 108 (90 Base) MCG/ACT inhaler Inhale 2 puffs into the lungs every 6 (six) hours as needed for wheezing or shortness of breath.   aspirin EC 81 MG tablet Take 81 mg by mouth daily.   baclofen (LIORESAL) 10 MG tablet Take 1 tablet (10 mg total) by mouth 3 (three) times daily.   cholecalciferol (VITAMIN D) 400 units TABS tablet Take 400 Units by mouth daily.   citalopram (CELEXA) 20 MG tablet  Take 1 tablet by mouth once daily   fluticasone (FLONASE) 50 MCG/ACT nasal spray Place 2 sprays into both nostrils daily.   hydrOXYzine (ATARAX) 25 MG tablet Take 0.5 tablets (12.5 mg total) by mouth 3 (three) times daily as needed for anxiety.   metoprolol succinate (TOPROL-XL) 50 MG 24 hr tablet Take 1 tablet (50 mg total) by mouth daily.   Multiple Vitamin (MULTIVITAMIN) tablet Take 1 tablet by mouth daily.   nitroGLYCERIN (NITROSTAT) 0.4 MG SL tablet Place 1 tablet (0.4 mg total) under the tongue every 5 (five) minutes as needed for chest pain.   nortriptyline (PAMELOR) 10 MG capsule Take 1 capsule (10 mg total) by mouth at bedtime.   Olmesartan-amLODIPine-HCTZ 40-5-25 MG TABS Take 1 tablet by mouth daily.   oxybutynin (DITROPAN XL) 15 MG 24 hr tablet Take 1 tablet (15 mg total) by mouth at bedtime.   pantoprazole (PROTONIX) 40 MG tablet Take 1 tablet (40 mg total) by mouth daily.   rosuvastatin (CRESTOR) 10 MG tablet Take 1 tablet by mouth once daily   No facility-administered encounter medications on file as of 04/21/2023.    Allergies (verified) Patient has no known allergies.   History: Past Medical History:  Diagnosis Date   Abnormal myocardial perfusion study 07/22/2018   Acute pharyngitis 09/26/2017   Adiposity 09/26/2013   AKI (acute kidney injury) 01/24/2017   Arthrofibrosis of knee joint, right 10/11/2013  Asthma    Chronic back pain 08/26/2019   Edema, unspecified 09/26/2013   Essential hypertension    Dx 30 ys ago; normal BP 130/60 (with med compliance)   Exertional chest pain 07/22/2018   Facet arthropathy, lumbosacral 06/15/2015   Gastro-esophageal reflux disease without esophagitis 09/26/2013   Gonalgia 09/26/2013   Heart murmur 02/27/2016   History of cataracts    bilateral; s/p surgery   History of colon polyps    Hyperlipidemia 04/23/2020   Hypokalemia 09/26/2013   Intervertebral disc degeneration, 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   Left knee pain 04/06/2007    Left thyroid nodule 10/28/2017   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   Nonintractable episodic headache 07/27/2017   Nuclear sclerotic cataract of left eye 07/13/2014   OA (osteoarthritis) of knee    s/p left TKR 09/2010   OAB (overactive bladder) 07/19/2019   Obesity, Class II, BMI 35-39.9 08/26/2019   Positive TB test    Primary osteoarthritis of right knee 07/19/2013   Pure hypercholesterolemia 04/06/2007   Annotation: reports a history, and that she was on a medication for it   RBBB (right bundle branch block) 07/22/2018   S/P total knee replacement, right 08/26/2016   Snoring 08/26/2019   STOP BANG 4   Syncope 01/24/2017   Transaminitis 01/24/2017   Xerostomia 10/28/2017   Past Surgical History:  Procedure Laterality Date   CATARACT EXTRACTION, BILATERAL  2018   KNEE SURGERY     states 2 surgeries on each knee around 2016 and 2017   TUBAL LIGATION     Family History  Problem Relation Age of Onset   Arthritis Mother    Hypertension Mother    Memory loss Mother    Cancer Father    Pancreatic cancer Father    Diabetes Sister    Hypertension Sister    Memory loss Sister    Arthritis Daughter    Asthma Daughter    Kidney disease Daughter    Memory loss Maternal Aunt    Colon cancer Neg Hx    Colon polyps Neg Hx    Esophageal cancer Neg Hx    Stomach cancer Neg Hx    Rectal cancer Neg Hx    Social History   Socioeconomic History   Marital status: Married    Spouse name: Not on file   Number of children: Not on file   Years of education: 12   Highest education level: High school graduate  Occupational History   Occupation: Retired  Tobacco Use   Smoking status: Former    Types: Cigarettes   Smokeless tobacco: Never   Tobacco comments:    quit 30 years ago, 1980  Vaping Use   Vaping status: Never Used  Substance and Sexual Activity   Alcohol use: No   Drug use: No   Sexual activity: Yes    Birth control/protection: None  Other Topics Concern   Not on file   Social History Narrative   Not on file   Social Drivers of Health   Financial Resource Strain: Low Risk  (04/21/2023)   Overall Financial Resource Strain (CARDIA)    Difficulty of Paying Living Expenses: Not hard at all  Food Insecurity: No Food Insecurity (04/21/2023)   Hunger Vital Sign    Worried About Running Out of Food in the Last Year: Never true    Ran Out of Food in the Last Year: Never true  Transportation Needs: No Transportation Needs (04/21/2023)   PRAPARE - Transportation    Lack of  Transportation (Medical): No    Lack of Transportation (Non-Medical): No  Physical Activity: Sufficiently Active (04/21/2023)   Exercise Vital Sign    Days of Exercise per Week: 7 days    Minutes of Exercise per Session: 60 min  Stress: No Stress Concern Present (04/21/2023)   [employer] of Occupational Health - Occupational Stress Questionnaire    Feeling of Stress : Not at all  Social Connections: Moderately Integrated (04/21/2023)   Social Connection and Isolation Panel [NHANES]    Frequency of Communication with Friends and Family: More than three times a week    Frequency of Social Gatherings with Friends and Family: More than three times a week    Attends Religious Services: More than 4 times per year    Active Member of [employer] or Organizations: Yes    Attends Engineer, structural: More than 4 times per year    Marital Status: Separated    Tobacco Counseling Counseling given: Not Answered Tobacco comments: quit 30 years ago, 1980   Clinical Intake:  Pre-visit preparation completed: Yes  Pain : No/denies pain     BMI - recorded: 35.67 Nutritional Status: BMI > 30  Obese Nutritional Risks: None Diabetes: No  How often do you need to have someone help you when you read instructions, pamphlets, or other written materials from your doctor or pharmacy?: 1 - Never  Interpreter Needed?: No  Information entered by :: Theresa Mulligan LPN   Activities of Daily  Living    04/21/2023    3:20 PM  In your present state of health, do you have any difficulty performing the following activities:  Hearing? 0  Vision? 0  Difficulty concentrating or making decisions? 0  Walking or climbing stairs? 0  Dressing or bathing? 0  Doing errands, shopping? 0  Preparing Food and eating ? N  Using the Toilet? N  In the past six months, have you accidently leaked urine? N  Do you have problems with loss of bowel control? N  Managing your Medications? N  Managing your Finances? N  Housekeeping or managing your Housekeeping? N    Patient Care Team: Sharlene Dory, DO as PCP - General (Family Medicine) Dulce Sellar Iline Oven, MD as PCP - Cardiology (Cardiology)  Indicate any recent Medical Services you may have received from other than [facility] providers in the past year (date may be approximate).     Assessment:   This is a routine wellness examination for [place].  Hearing/Vision screen Hearing Screening - Comments:: Denies hearing difficulties   Vision Screening - Comments:: Wears rx glasses - up to date with routine eye exams with  [facility]   Goals Addressed               This Visit's Progress     Increase physical activity (pt-stated)        Stay active and lose weight.       Depression Screen    04/21/2023    3:19 PM 04/18/2022    3:43 PM 10/28/2021    8:08 AM 03/26/2021    3:16 PM 10/22/2020    8:54 AM 01/23/2020    7:04 AM 12/21/2018    8:57 AM  PHQ 2/9 Scores  PHQ - 2 Score 0 1 0 0 0 0 0  PHQ- 9 Score   0        Fall Risk    04/21/2023    3:20 PM 04/18/2022    3:41 PM 04/07/2022  11:53 AM 10/28/2021    8:08 AM 03/26/2021    3:18 PM  Fall Risk   Falls in the past year? 0 1 1 0 1  Number falls in past yr: 0 0 0 0 1  Injury with Fall? 0 1 1 0 1  Comment     fell on ice and bruised right thigh  Risk for fall due to : No Fall Risks History of fall(s) History of fall(s) No Fall Risks Impaired vision  Follow up Falls prevention  discussed Falls evaluation completed  Falls evaluation completed Falls prevention discussed    MEDICARE RISK AT HOME: Medicare Risk at Home Any stairs in or around the home?: Yes If so, are there any without handrails?: No Home free of loose throw rugs in walkways, pet beds, electrical cords, etc?: Yes Adequate lighting in your home to reduce risk of falls?: Yes Life alert?: No Use of a cane, walker or w/c?: No Grab bars in the bathroom?: Yes Shower chair or bench in shower?: No Elevated toilet seat or a handicapped toilet?: No  TIMED UP AND GO:  Was the test performed?  No    Cognitive Function:        04/21/2023    3:21 PM 04/18/2022    3:50 PM 03/26/2021    3:20 PM  6CIT Screen  What Year? 0 points 0 points 0 points  What month? 0 points 0 points 0 points  What time? 0 points 0 points 0 points  Count back from 20 0 points 0 points 0 points  Months in reverse 0 points 0 points 0 points  Repeat phrase 0 points 2 points 0 points  Total Score 0 points 2 points 0 points    Immunizations Immunization History  Administered Date(s) Administered   Fluad Quad(high Dose 65+) 12/21/2018, 12/10/2020, 12/19/2021   Influenza, High Dose Seasonal PF 12/14/2017, 01/16/2020   PFIZER(Purple Top)SARS-COV-2 Vaccination 04/18/2019, 05/09/2019, 01/16/2020, 08/13/2020   Pfizer Covid-19 Vaccine Bivalent Booster 73yrs & up 12/10/2020   Pneumococcal Conjugate-13 03/06/2017   Pneumococcal Polysaccharide-23 12/31/2015, 12/24/2018   Tdap 04/03/2015   Zoster Recombinant(Shingrix) 06/30/2020, 10/03/2020    TDAP status: Up to date  Flu Vaccine status: Due, Education has been provided regarding the importance of this vaccine. Advised may receive this vaccine at local pharmacy or Health Dept. Aware to provide a copy of the vaccination record if obtained from local pharmacy or Health Dept. Verbalized acceptance and understanding.  Pneumococcal vaccine status: Up to date  Covid-19 vaccine status:  Declined, Education has been provided regarding the importance of this vaccine but patient still declined. Advised may receive this vaccine at local pharmacy or Health Dept.or vaccine clinic. Aware to provide a copy of the vaccination record if obtained from local pharmacy or Health Dept. Verbalized acceptance and understanding.  Qualifies for Shingles Vaccine? Yes   Zostavax completed Yes   Shingrix Completed?: Yes  Screening Tests Health Maintenance  Topic Date Due   INFLUENZA VACCINE  10/23/2022   COVID-19 Vaccine (6 - 2024-25 season) 11/23/2022   Medicare Annual Wellness (AWV)  04/20/2024   MAMMOGRAM  07/16/2024   DTaP/Tdap/Td (2 - Td or Tdap) 04/02/2025   Colonoscopy  07/12/2030   Pneumonia Vaccine 54+ Years old  Completed   DEXA SCAN  Completed   Hepatitis C Screening  Completed   Zoster Vaccines- Shingrix  Completed   HPV VACCINES  Aged Out    Health Maintenance  Health Maintenance Due  Topic Date Due   INFLUENZA VACCINE  10/23/2022   COVID-19 Vaccine (6 - 2024-25 season) 11/23/2022    Colorectal cancer screening: Type of screening: Colonoscopy. Completed 07/11/20. Repeat every 10 years  Mammogram status: Completed 07/17/22. Repeat every year  Bone Density status: Completed 05/12/22. Results reflect: Bone density results: OSTEOPENIA. Repeat every   years.    Additional Screening:  Hepatitis C Screening: does qualify; Completed 09/16/18  Vision Screening: Recommended annual ophthalmology exams for early detection of glaucoma and other disorders of the eye. Is the patient up to date with their annual eye exam?  Yes  Who is the provider or what is the name of the office in which the patient attends annual eye exams? [facility] If pt is not established with a provider, would they like to be referred to a provider to establish care? No .   Dental Screening: Recommended annual dental exams for proper oral hygiene    Community Resource Referral / Chronic Care  Management:  CRR required this visit?  No   CCM required this visit?  No     Plan:     I have personally reviewed and noted the following in the patient's chart:   Medical and social history Use of alcohol, tobacco or illicit drugs  Current medications and supplements including opioid prescriptions. Patient is not currently taking opioid prescriptions. Functional ability and status Nutritional status Physical activity Advanced directives List of other physicians Hospitalizations, surgeries, and ER visits in previous 12 months Vitals Screenings to include cognitive, depression, and falls Referrals and appointments  In addition, I have reviewed and discussed with patient certain preventive protocols, quality metrics, and best practice recommendations. A written personalized care plan for preventive services as well as general preventive health recommendations were provided to patient.     Tillie Rung, LPN   1/61/0960   After Visit Summary: (MyChart) Due to this being a telephonic visit, the after visit summary with patients personalized plan was offered to patient via MyChart   Nurse Notes: None

## 2023-04-21 NOTE — Patient Instructions (Addendum)
Cynthia Montgomery , Thank you for taking time to come for your Medicare Wellness Visit. I appreciate your ongoing commitment to your health goals. Please review the following plan we discussed and let me know if I can assist you in the future.   Referrals/Orders/Follow-Ups/Clinician Recommendations:   This is a list of the screening recommended for you and due dates:  Health Maintenance  Topic Date Due   Flu Shot  10/23/2022   COVID-19 Vaccine (6 - 2024-25 season) 11/23/2022   Medicare Annual Wellness Visit  04/20/2024   Mammogram  07/16/2024   DTaP/Tdap/Td vaccine (2 - Td or Tdap) 04/02/2025   Colon Cancer Screening  07/12/2030   Pneumonia Vaccine  Completed   DEXA scan (bone density measurement)  Completed   Hepatitis C Screening  Completed   Zoster (Shingles) Vaccine  Completed   HPV Vaccine  Aged Out    Advanced directives: (Declined) Advance directive discussed with you today. Even though you declined this today, please call our office should you change your mind, and we can give you the proper paperwork for you to fill out.  Next Medicare Annual Wellness Visit scheduled for next year: Yes

## 2023-05-06 DIAGNOSIS — G4733 Obstructive sleep apnea (adult) (pediatric): Secondary | ICD-10-CM | POA: Diagnosis not present

## 2023-05-07 ENCOUNTER — Other Ambulatory Visit: Payer: Self-pay | Admitting: Cardiology

## 2023-05-07 DIAGNOSIS — I10 Essential (primary) hypertension: Secondary | ICD-10-CM

## 2023-05-12 ENCOUNTER — Other Ambulatory Visit: Payer: Self-pay | Admitting: Family Medicine

## 2023-05-27 ENCOUNTER — Encounter (HOSPITAL_BASED_OUTPATIENT_CLINIC_OR_DEPARTMENT_OTHER): Payer: Self-pay | Admitting: Emergency Medicine

## 2023-05-27 ENCOUNTER — Other Ambulatory Visit: Payer: Self-pay

## 2023-05-27 ENCOUNTER — Emergency Department (HOSPITAL_BASED_OUTPATIENT_CLINIC_OR_DEPARTMENT_OTHER)
Admission: EM | Admit: 2023-05-27 | Discharge: 2023-05-27 | Disposition: A | Discharge status: Home / Self Care | Attending: Emergency Medicine | Admitting: Emergency Medicine

## 2023-05-27 DIAGNOSIS — S90935A Unspecified superficial injury of left lesser toe(s), initial encounter: Secondary | ICD-10-CM | POA: Diagnosis not present

## 2023-05-27 DIAGNOSIS — S99922A Unspecified injury of left foot, initial encounter: Secondary | ICD-10-CM

## 2023-05-27 DIAGNOSIS — Z7982 Long term (current) use of aspirin: Secondary | ICD-10-CM | POA: Diagnosis not present

## 2023-05-27 DIAGNOSIS — W228XXA Striking against or struck by other objects, initial encounter: Secondary | ICD-10-CM | POA: Insufficient documentation

## 2023-05-27 NOTE — ED Triage Notes (Signed)
 States hit left pinky toe over rug this morning. C/o of pain in area. Patient is ambulatory.

## 2023-05-27 NOTE — ED Provider Notes (Signed)
 [facility] Provider Note   CSN: [id number] Arrival date & time: 05/27/23  1728     History  Chief Complaint  Patient presents with   Toe Pain    Cynthia Montgomery is a 73 y.o. female who presents for evaluation of left little toe injury.  Patient hit her left pinky toe on a rug this morning.  She has some pain in her toe when she bears weight.  No angulation deformity or bruising.   Toe Pain       Home Medications Prior to Admission medications   Medication Sig Start Date End Date Taking? Authorizing Provider  albuterol (VENTOLIN HFA) 108 (90 Base) MCG/ACT inhaler Inhale 2 puffs into the lungs every 6 (six) hours as needed for wheezing or shortness of breath. 02/27/20   Sharlene Dory, DO  aspirin EC 81 MG tablet Take 81 mg by mouth daily.    [provider]  baclofen (LIORESAL) 10 MG tablet Take 1 tablet (10 mg total) by mouth 3 (three) times daily. 09/03/22   Sharlene Dory, DO  cholecalciferol (VITAMIN D) 400 units TABS tablet Take 400 Units by mouth daily.    [provider]  citalopram (CELEXA) 20 MG tablet Take 1 tablet by mouth once daily 03/24/23   Carmelia Roller, Jilda Roche, DO  fluticasone [facility]) 50 MCG/ACT nasal spray Place 2 sprays into both nostrils daily. 07/15/22   Sharlene Dory, DO  hydrOXYzine (ATARAX) 25 MG tablet Take 0.5 tablets (12.5 mg total) by mouth 3 (three) times daily as needed for anxiety. 10/28/21   Sharlene Dory, DO  metoprolol succinate (TOPROL-XL) 50 MG 24 hr tablet Take 1 tablet (50 mg total) by mouth daily. 05/07/23   Georgeanna Lea, MD  Multiple Vitamin (MULTIVITAMIN) tablet Take 1 tablet by mouth daily.    [provider]  nitroGLYCERIN (NITROSTAT) 0.4 MG SL tablet Place 1 tablet (0.4 mg total) under the tongue every 5 (five) minutes x 3 doses as needed for chest pain. 05/12/23   Sharlene Dory, DO  nortriptyline (PAMELOR) 10 MG  capsule Take 1 capsule (10 mg total) by mouth at bedtime. 03/11/22   Myra Rude, MD  Olmesartan-amLODIPine-HCTZ 40-5-25 MG TABS Take 1 tablet by mouth daily. 07/15/22   Sharlene Dory, DO  oxybutynin (DITROPAN XL) 15 MG 24 hr tablet Take 1 tablet (15 mg total) by mouth at bedtime. 09/03/22   Sharlene Dory, DO  pantoprazole (PROTONIX) 40 MG tablet Take 1 tablet (40 mg total) by mouth daily. 02/06/23   Sharlene Dory, DO  rosuvastatin (CRESTOR) 10 MG tablet Take 1 tablet by mouth once daily 03/24/23   Sharlene Dory, DO      Allergies    Patient has no known allergies.    Review of Systems   Review of Systems  Physical Exam Updated Vital Signs BP (!) 145/98 (BP Location: Right Arm)   Pulse 63   Temp 97.9 F (36.6 C)   Resp 18   Ht 5\' 1"  (1.549 m)   Wt 89.8 kg   SpO2 95%   BMI 37.41 kg/m  Physical Exam Vitals and nursing note reviewed.  Constitutional:      General: She is not in acute distress.    Appearance: She is well-developed. She is not diaphoretic.  HENT:     Head: Normocephalic and atraumatic.     Right Ear: External ear normal.     Left Ear:  External ear normal.     Nose: Nose normal.     Mouth/Throat:     Mouth: Mucous membranes are moist.  Eyes:     General: No scleral icterus.    Conjunctiva/sclera: Conjunctivae normal.  Cardiovascular:     Rate and Rhythm: Normal rate and regular rhythm.     Heart sounds: Normal heart sounds. No murmur heard.    No friction rub. No gallop.  Pulmonary:     Effort: Pulmonary effort is normal. No respiratory distress.     Breath sounds: Normal breath sounds.  Abdominal:     General: Bowel sounds are normal. There is no distension.     Palpations: Abdomen is soft. There is no mass.     Tenderness: There is no abdominal tenderness. There is no guarding.  Musculoskeletal:     Cervical back: Normal range of motion.     Comments: Normal-appearing left little toe without bruising  angulation deformity subungual hematoma.  No bruising to the plantar surface of the foot, able to wiggle the toe and ambulate  Skin:    General: Skin is warm and dry.  Neurological:     Mental Status: She is alert and oriented to person, place, and time.  Psychiatric:        Behavior: Behavior normal.     ED Results / Procedures / Treatments   Labs (all labs ordered are listed, but only abnormal results are displayed) Labs Reviewed - No data to display  EKG None  Radiology No results found.  Procedures Procedures    Medications Ordered in ED Medications - No data to display  ED Course/ Medical Decision Making/ A&P                                 Medical Decision Making  Patient here with left toe injury.  In shared decision making patient will forego an x-ray as it will not change treatment.  Patient given postop shoe, supportive care and outpatient follow-up.  Discussed return precautions        Final Clinical Impression(s) / ED Diagnoses Final diagnoses:  Toe injury, left, initial encounter    Rx / DC Orders ED Discharge Orders     None         Arthor Captain, PA-C 05/27/23 1754    Virgina Norfolk, DO 05/27/23 1811

## 2023-05-27 NOTE — Discharge Instructions (Signed)
 Tylenol or Advil for pain.  Ice your toe. Contact a health care provider if you have: Pain that gets worse or does not get better with medicine. A fever. A bad smell coming from your cast. Get help right away if you have: Any of the following in your toes or your foot: Numbness or tingling that gets worse. Coldness. Blue skin. Redness or swelling that gets worse. Pain that suddenly becomes severe.

## 2023-06-20 ENCOUNTER — Other Ambulatory Visit: Payer: Self-pay | Admitting: Family Medicine

## 2023-06-20 DIAGNOSIS — F411 Generalized anxiety disorder: Secondary | ICD-10-CM

## 2023-07-04 ENCOUNTER — Other Ambulatory Visit: Payer: Self-pay | Admitting: Family Medicine

## 2023-07-04 DIAGNOSIS — F411 Generalized anxiety disorder: Secondary | ICD-10-CM

## 2023-07-07 ENCOUNTER — Other Ambulatory Visit: Payer: Self-pay | Admitting: Family Medicine

## 2023-07-14 ENCOUNTER — Other Ambulatory Visit: Payer: Self-pay | Admitting: Family Medicine

## 2023-07-14 DIAGNOSIS — F411 Generalized anxiety disorder: Secondary | ICD-10-CM

## 2023-07-15 ENCOUNTER — Other Ambulatory Visit: Payer: Self-pay | Admitting: Family Medicine

## 2023-07-15 DIAGNOSIS — F411 Generalized anxiety disorder: Secondary | ICD-10-CM

## 2023-07-15 NOTE — Telephone Encounter (Signed)
 Copied from CRM [id number]. Topic: Clinical - Medication Refill >> Jul 15, 2023 11:01 AM Marlan Silva wrote: Most Recent Primary Care Visit:  Provider: Dewayne Ford  Department: [facility]  Visit Type: MEDICARE AWV, SEQUENTIAL  Date: 04/21/2023  Medication: citalopram  (CELEXA ) 20 MG tablet  Has the patient contacted their pharmacy? Yes (Agent: If no, request that the patient contact the pharmacy for the refill. If patient does not wish to contact the pharmacy document the reason why and proceed with request.) (Agent: If yes, when and what did the pharmacy advise?)  Is this the correct pharmacy for this prescription? Yes If no, delete pharmacy and type the correct one.  This is the patient's preferred pharmacy:  [facility] [address] [place] - [address] [place] [address] Phone: [phone] Fax: [phone]   Has the prescription been filled recently? Yes  Is the patient out of the medication? Yes  Has the patient been seen for an appointment in the last year OR does the patient have an upcoming appointment? Yes  Can we respond through MyChart? Yes  Agent: Please be advised that Rx refills may take up to 3 business days. We ask that you follow-up with your pharmacy.

## 2023-07-15 NOTE — Telephone Encounter (Signed)
 duplicate

## 2023-08-04 ENCOUNTER — Other Ambulatory Visit: Payer: Self-pay | Admitting: Family Medicine

## 2023-08-04 DIAGNOSIS — K219 Gastro-esophageal reflux disease without esophagitis: Secondary | ICD-10-CM

## 2023-08-31 ENCOUNTER — Other Ambulatory Visit: Payer: Self-pay | Admitting: Family Medicine

## 2023-08-31 MED ORDER — ROSUVASTATIN CALCIUM 10 MG PO TABS: 10.0000 mg | ORAL_TABLET | Freq: Every day | ORAL | 0 refills | Status: DC

## 2023-08-31 NOTE — Telephone Encounter (Signed)
 Copied from CRM [id number]. Topic: Clinical - Medication Refill >> Aug 31, 2023 11:11 AM Jenice Mitts wrote: Medication:  rosuvastatin  (CRESTOR ) 10 MG tablet   Has the patient contacted their pharmacy? Yes (Agent: If no, request that the patient contact the pharmacy for the refill. If patient does not wish to contact the pharmacy document the reason why and proceed with request.) (Agent: If yes, when and what did the pharmacy advise?)  This is the patient's preferred pharmacy:  [facility] [address] [place] - [address] [place] [address] Phone: [phone] Fax: [phone]  Is this the correct pharmacy for this prescription? Yes If no, delete pharmacy and type the correct one.   Has the prescription been filled recently? No  Is the patient out of the medication? No  Has the patient been seen for an appointment in the last year OR does the patient have an upcoming appointment? Yes  Can we respond through MyChart? Yes  Agent: Please be advised that Rx refills may take up to 3 business days. We ask that you follow-up with your pharmacy.

## 2023-09-02 ENCOUNTER — Other Ambulatory Visit: Payer: Self-pay | Admitting: Family Medicine

## 2023-09-02 DIAGNOSIS — K219 Gastro-esophageal reflux disease without esophagitis: Secondary | ICD-10-CM

## 2023-09-16 ENCOUNTER — Encounter: Payer: Self-pay | Admitting: Family Medicine

## 2023-09-16 ENCOUNTER — Ambulatory Visit: Payer: Self-pay | Admitting: Family Medicine

## 2023-09-16 ENCOUNTER — Ambulatory Visit (INDEPENDENT_AMBULATORY_CARE_PROVIDER_SITE_OTHER): Admitting: Family Medicine

## 2023-09-16 ENCOUNTER — Other Ambulatory Visit: Payer: Self-pay | Admitting: Family Medicine

## 2023-09-16 VITALS — BP 134/84 | HR 64 | Temp 98.0°F | Resp 16 | Ht 61.0 in | Wt 197.8 lb

## 2023-09-16 DIAGNOSIS — K219 Gastro-esophageal reflux disease without esophagitis: Secondary | ICD-10-CM

## 2023-09-16 DIAGNOSIS — G8929 Other chronic pain: Secondary | ICD-10-CM | POA: Diagnosis not present

## 2023-09-16 DIAGNOSIS — I10 Essential (primary) hypertension: Secondary | ICD-10-CM

## 2023-09-16 DIAGNOSIS — M25512 Pain in left shoulder: Secondary | ICD-10-CM | POA: Diagnosis not present

## 2023-09-16 DIAGNOSIS — E785 Hyperlipidemia, unspecified: Secondary | ICD-10-CM | POA: Diagnosis not present

## 2023-09-16 DIAGNOSIS — Z1231 Encounter for screening mammogram for malignant neoplasm of breast: Secondary | ICD-10-CM

## 2023-09-16 LAB — COMPREHENSIVE METABOLIC PANEL WITH GFR
ALT: 16 U/L (ref 0–35)
AST: 19 U/L (ref 0–37)
Albumin: 4 g/dL (ref 3.5–5.2)
Alkaline Phosphatase: 67 U/L (ref 39–117)
BUN: 23 mg/dL (ref 6–23)
CO2: 26 meq/L (ref 19–32)
Calcium: 9.3 mg/dL (ref 8.4–10.5)
Chloride: 102 meq/L (ref 96–112)
Creatinine, Ser: 1.13 mg/dL (ref 0.40–1.20)
GFR: 48.39 mL/min — ABNORMAL LOW (ref >60.00)
Glucose, Bld: 111 mg/dL — ABNORMAL HIGH (ref 70–99)
Potassium: 3.9 meq/L (ref 3.5–5.1)
Sodium: 137 meq/L (ref 135–145)
Total Bilirubin: 0.4 mg/dL (ref 0.2–1.2)
Total Protein: 7 g/dL (ref 6.0–8.3)

## 2023-09-16 LAB — LIPID PANEL
Cholesterol: 176 mg/dL (ref 0–200)
HDL: 55 mg/dL (ref >39.00)
LDL Cholesterol: 104 mg/dL — ABNORMAL HIGH (ref 0–99)
NonHDL: 120.77
Total CHOL/HDL Ratio: 3
Triglycerides: 83 mg/dL (ref 0.0–149.0)
VLDL: 16.6 mg/dL (ref 0.0–40.0)

## 2023-09-16 MED ORDER — PANTOPRAZOLE SODIUM 40 MG PO TBEC: 40.0000 mg | DELAYED_RELEASE_TABLET | Freq: Every day | ORAL | 1 refills | Status: DC

## 2023-09-16 MED ORDER — OLMESARTAN-AMLODIPINE-HCTZ 40-5-25 MG PO TABS: 1.0000 | ORAL_TABLET | Freq: Every day | ORAL | 1 refills | Status: DC

## 2023-09-16 MED ORDER — ROSUVASTATIN CALCIUM 10 MG PO TABS: 10.0000 mg | ORAL_TABLET | Freq: Every day | ORAL | 1 refills | Status: DC

## 2023-09-16 NOTE — Patient Instructions (Signed)
Give us 2-3 business days to get the results of your labs back.   Keep the diet clean and stay active.  Let us know if you need anything.  EXERCISES  RANGE OF MOTION (ROM) AND STRETCHING EXERCISES These exercises may help you when beginning to rehabilitate your injury. While completing these exercises, remember:  Restoring tissue flexibility helps normal motion to return to the joints. This allows healthier, less painful movement and activity. An effective stretch should be held for at least 30 seconds. A stretch should never be painful. You should only feel a gentle lengthening or release in the stretched tissue.  ROM - Pendulum Bend at the waist so that your right / left arm falls away from your body. Support yourself with your opposite hand on a solid surface, such as a table or a countertop. Your right / left arm should be perpendicular to the ground. If it is not perpendicular, you need to lean over farther. Relax the muscles in your right / left arm and shoulder as much as possible. Gently sway your hips and trunk so they move your right / left arm without any use of your right / left shoulder muscles. Progress your movements so that your right / left arm moves side to side, then forward and backward, and finally, both clockwise and counterclockwise. Complete 10-15 repetitions in each direction. Many people use this exercise to relieve discomfort in their shoulder as well as to gain range of motion. Repeat 2 times. Complete this exercise 3 times per week.  STRETCH - Flexion, Standing Stand with good posture. With an underhand grip on your right / left hand and an overhand grip on the opposite hand, grasp a broomstick or cane so that your hands are a little more than shoulder-width apart. Keeping your right / left elbow straight and shoulder muscles relaxed, push the stick with your opposite hand to raise your right / left arm in front of your body and then overhead. Raise your arm until  you feel a stretch in your right / left shoulder, but before you have increased shoulder pain. Try to avoid shrugging your right / left shoulder as your arm rises by keeping your shoulder blade tucked down and toward your mid-back spine. Hold 30 seconds. Slowly return to the starting position. Repeat 2 times. Complete this exercise 3 times per week.  STRETCH - Internal Rotation Place your right / left hand behind your back, palm-up. Throw a towel or belt over your opposite shoulder. Grasp the towel/belt with your right / left hand. While keeping an upright posture, gently pull up on the towel/belt until you feel a stretch in the front of your right / left shoulder. Avoid shrugging your right / left shoulder as your arm rises by keeping your shoulder blade tucked down and toward your mid-back spine. Hold 30. Release the stretch by lowering your opposite hand. Repeat 2 times. Complete this exercise 3 times per week.  STRETCH - External Rotation and Abduction Stagger your stance through a doorframe. It does not matter which foot is forward. As instructed by your physician, physical therapist or athletic trainer, place your hands: And forearms above your head and on the door frame. And forearms at head-height and on the door frame. At elbow-height and on the door frame. Keeping your head and chest upright and your stomach muscles tight to prevent over-extending your low-back, slowly shift your weight onto your front foot until you feel a stretch across your chest and/or in   the front of your shoulders. Hold 30 seconds. Shift your weight to your back foot to release the stretch. Repeat 2 times. Complete this stretch 3 times per week.   STRENGTHENING EXERCISES  These exercises may help you when beginning to rehabilitate your injury. They may resolve your symptoms with or without further involvement from your physician, physical therapist or athletic trainer. While completing these exercises,  remember:  Muscles can gain both the endurance and the strength needed for everyday activities through controlled exercises. Complete these exercises as instructed by your physician, physical therapist or athletic trainer. Progress the resistance and repetitions only as guided. You may experience muscle soreness or fatigue, but the pain or discomfort you are trying to eliminate should never worsen during these exercises. If this pain does worsen, stop and make certain you are following the directions exactly. If the pain is still present after adjustments, discontinue the exercise until you can discuss the trouble with your clinician. If advised by your physician, during your recovery, avoid activity or exercises which involve actions that place your right / left hand or elbow above your head or behind your back or head. These positions stress the tissues which are trying to heal.  STRENGTH - Scapular Depression and Adduction With good posture, sit on a firm chair. Supported your arms in front of you with pillows, arm rests or a table top. Have your elbows in line with the sides of your body. Gently draw your shoulder blades down and toward your mid-back spine. Gradually increase the tension without tensing the muscles along the top of your shoulders and the back of your neck. Hold for 3 seconds. Slowly release the tension and relax your muscles completely before completing the next repetition. After you have practiced this exercise, remove the arm support and complete it in standing as well as sitting. Repeat 2 times. Complete this exercise 3 times per week.   STRENGTH - External Rotators Secure a rubber exercise band/tubing to a fixed object so that it is at the same height as your right / left elbow when you are standing or sitting on a firm surface. Stand or sit so that the secured exercise band/tubing is at your side that is not injured. Bend your elbow 90 degrees. Place a folded towel or small  pillow under your right / left arm so that your elbow is a few inches away from your side. Keeping the tension on the exercise band/tubing, pull it away from your body, as if pivoting on your elbow. Be sure to keep your body steady so that the movement is only coming from your shoulder rotating. Hold 3 seconds. Release the tension in a controlled manner as you return to the starting position. Repeat 2 times. Complete this exercise 3 times per week.   STRENGTH - Supraspinatus Stand or sit with good posture. Grasp a 2-3 lb weight or an exercise band/tubing so that your hand is "thumbs-up," like when you shake hands. Slowly lift your right / left hand from your thigh into the air, traveling about 30 degrees from straight out at your side. Lift your hand to shoulder height or as far as you can without increasing any shoulder pain. Initially, many people do not lift their hands above shoulder height. Avoid shrugging your right / left shoulder as your arm rises by keeping your shoulder blade tucked down and toward your mid-back spine. Hold for 3 seconds. Control the descent of your hand as you slowly return to your starting   position. Repeat 2 times. Complete this exercise 3 times per week.   STRENGTH - Shoulder Extensors Secure a rubber exercise band/tubing so that it is at the height of your shoulders when you are either standing or sitting on a firm arm-less chair. With a thumbs-up grip, grasp an end of the band/tubing in each hand. Straighten your elbows and lift your hands straight in front of you at shoulder height. Step back away from the secured end of band/tubing until it becomes tense. Squeezing your shoulder blades together, pull your hands down to the sides of your thighs. Do not allow your hands to go behind you. Hold for 3 seconds. Slowly ease the tension on the band/tubing as you reverse the directions and return to the starting position. Repeat 2 times. Complete this exercise 3 times per  week.   STRENGTH - Scapular Retractors Secure a rubber exercise band/tubing so that it is at the height of your shoulders when you are either standing or sitting on a firm arm-less chair. With a palm-down grip, grasp an end of the band/tubing in each hand. Straighten your elbows and lift your hands straight in front of you at shoulder height. Step back away from the secured end of band/tubing until it becomes tense. Squeezing your shoulder blades together, draw your elbows back as you bend them. Keep your upper arm lifted away from your body throughout the exercise. Hold 3 seconds. Slowly ease the tension on the band/tubing as you reverse the directions and return to the starting position. Repeat 2 times. Complete this exercise 3 times per week.  STRENGTH - Scapular Depressors Find a sturdy chair without wheels, such as a from a dining room table. Keeping your feet on the floor, lift your bottom from the seat and lock your elbows. Keeping your elbows straight, allow gravity to pull your body weight down. Your shoulders will rise toward your ears. Raise your body against gravity by drawing your shoulder blades down your back, shortening the distance between your shoulders and ears. Although your feet should always maintain contact with the floor, your feet should progressively support less body weight as you get stronger. Hold 3 seconds. In a controlled and slow manner, lower your body weight to begin the next repetition. Repeat 2 times. Complete this exercise 3 times per week.    This information is not intended to replace advice given to you by your health care provider. Make sure you discuss any questions you have with your health care provider.   Document Released: 01/22/2005 Document Revised: 03/31/2014 Document Reviewed: 06/22/2008 Elsevier Interactive Patient Education 2016 [employer]  

## 2023-09-16 NOTE — Progress Notes (Signed)
 Chief Complaint  Patient presents with   Medication Refill    Medication Refill    Subjective Cynthia Montgomery is a 73 y.o. female who presents for hypertension follow up. She does monitor home blood pressures. Blood pressures ranging from 130's/80's on average. She is compliant with medications- Azor-hydrochlorothiazide  40-5-25 mg/d. Patient has these side effects of medication: none She is adhering to a healthy diet overall. Current exercise: none No CP or SOB.   Hyperlipidemia Patient presents for hyperlipidemia follow up. Currently being treated with Crestor  10 mg/d and compliance with treatment thus far has been good. She denies myalgias. Diet/exercise as above.  The patient is not known to have coexisting coronary artery disease.  GERD Patient has a history of GERD taking Protonix  40 mg daily.  Compliant, no adverse effects.  No issues with reflux symptoms since taking the medication.  Denies any difficulty swallowing, unintentional weight loss, bleeding, or pain.  Patient has a several month history of left shoulder pain.  She has associated decreased range of motion.  No bruising, redness, swelling, or neurologic signs or symptoms.  She has tried Tylenol  and ice at home.   Past Medical History:  Diagnosis Date   Abnormal myocardial perfusion study 07/22/2018   Acute pharyngitis 09/26/2017   Adiposity 09/26/2013   AKI (acute kidney injury) 01/24/2017   Arthrofibrosis of knee joint, right 10/11/2013   Asthma    Chronic back pain 08/26/2019   Edema, unspecified 09/26/2013   Essential hypertension    Dx 30 ys ago; normal BP 130/60 (with med compliance)   Exertional chest pain 07/22/2018   Facet arthropathy, lumbosacral 06/15/2015   Gastro-esophageal reflux disease without esophagitis 09/26/2013   Gonalgia 09/26/2013   Heart murmur 02/27/2016   History of cataracts    bilateral; s/p surgery   History of colon polyps    Hyperlipidemia 04/23/2020   Hypokalemia  09/26/2013   Intervertebral disc degeneration, 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   Left knee pain 04/06/2007   Left thyroid  nodule 10/28/2017   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   Nonintractable episodic headache 07/27/2017   Nuclear sclerotic cataract of left eye 07/13/2014   OA (osteoarthritis) of knee    s/p left TKR 09/2010   OAB (overactive bladder) 07/19/2019   Obesity, Class II, BMI 35-39.9 08/26/2019   Positive TB test    Primary osteoarthritis of right knee 07/19/2013   Pure hypercholesterolemia 04/06/2007   Annotation: reports a history, and that she was on a medication for it   RBBB (right bundle branch block) 07/22/2018   S/P total knee replacement, right 08/26/2016   Snoring 08/26/2019   STOP BANG 4   Syncope 01/24/2017   Transaminitis 01/24/2017   Xerostomia 10/28/2017    Exam BP 134/84 (BP Location: Left Arm, Patient Position: Sitting)   Pulse 64   Temp 98 F (36.7 C) (Oral)   Resp 16   Ht 5' 1 (1.549 m)   Wt 197 lb 12.8 oz (89.7 kg)   SpO2 95%   BMI 37.37 kg/m  General:  well developed, well nourished, in no apparent distress Heart: RRR, no bruits, no LE edema Lungs: clear to auscultation, no accessory muscle use GI: Bowel sounds present, soft, nontender, nondistended MSK: No TTP over the trapezius or shoulder region.  No deformity or edema.  Decreased active and passive range of motion.  Positive Neer's, empty can, Hawkins.  Negative speeds, crossover, O'Brien's. Psych: well oriented with normal range of affect and appropriate judgment/insight  Essential hypertension - Plan: Olmesartan -amLODIPine -HCTZ 40-5-25  MG TABS  Gastroesophageal reflux disease, unspecified whether esophagitis present - Plan: pantoprazole  (PROTONIX ) 40 MG tablet  Hyperlipidemia, unspecified hyperlipidemia type - Plan: rosuvastatin  (CRESTOR ) 10 MG tablet, Comprehensive metabolic panel with GFR, Lipid panel  Chronic left shoulder pain  Chronic, stable. Cont Azor-hct 40-5-25 mg/d. Counseled on  diet and exercise. Chronic, stable. Cont Crestor  10 mg/d.  Chronic, stable. Cont Protonix  40 mg/d.  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  Stretches and exercises provided.  Heat, ice, Tylenol .  Her husband will come back in 1 month, if she is still having issues she will join him and we will consider steroid injection versus physical therapy. F/u in 6 mo. The patient voiced understanding and agreement to the plan.  Mabel [place], DO 09/16/23  11:16 AM

## 2023-09-21 ENCOUNTER — Telehealth: Payer: Self-pay | Admitting: Adult Health

## 2023-09-21 NOTE — Telephone Encounter (Signed)
 Dec 2024 office visit faxed to [facility]. Received a receipt of confirmation.

## 2023-09-21 NOTE — Telephone Encounter (Signed)
 Beth from [facility] called in regards to Pt CPAP supplies .  [facility] received Paper work but have not received Pt last Office Note .   Phone number [phone]  Fax Number [phone]

## 2023-09-23 ENCOUNTER — Ambulatory Visit
Admission: RE | Admit: 2023-09-23 | Discharge: 2023-09-23 | Disposition: A | Discharge status: Home / Self Care | Source: Ambulatory Visit | Attending: Family Medicine | Admitting: Family Medicine

## 2023-09-23 DIAGNOSIS — Z1231 Encounter for screening mammogram for malignant neoplasm of breast: Secondary | ICD-10-CM

## 2023-09-28 NOTE — Telephone Encounter (Signed)
 [facility]) faxed detailed ordered form for CPAP supplies, requesting face to face notes, sleep study.  Contact information: [phone], Fax: [phone]

## 2023-09-28 NOTE — Telephone Encounter (Signed)
 Faxed to [facility] last OV, SS, and order.  [phone].  Fax confirmation received.

## 2023-10-08 ENCOUNTER — Other Ambulatory Visit: Payer: Self-pay | Admitting: Family Medicine

## 2023-10-08 DIAGNOSIS — F411 Generalized anxiety disorder: Secondary | ICD-10-CM

## 2023-10-14 ENCOUNTER — Other Ambulatory Visit: Payer: Self-pay | Admitting: Family Medicine

## 2023-10-14 DIAGNOSIS — F411 Generalized anxiety disorder: Secondary | ICD-10-CM

## 2023-10-16 ENCOUNTER — Encounter: Payer: Self-pay | Admitting: Pharmacist

## 2023-10-16 NOTE — Progress Notes (Signed)
 Pharmacy Quality Measure Review  This patient is appearing on a report for being at risk of failing the adherence measure for cholesterol (statin) medications this calendar year.   Medication: rosuvastatin  Last fill date: 08/04/2023 for 30 day supply per refill report  Reviewed recent refill history in Dr Annemarie database. Actual last refill date was 09/16/2023 for 90 day supply. Patient has 1 refill remaining. Next appointment with PCP is not yet scheduled but last visit was 09/16/2023.    Insurance report was not up to date. No action needed at this time.   Madelin Ray, PharmD Clinical Pharmacist [facility] Primary Care  Population Health [phone]

## 2023-10-26 ENCOUNTER — Other Ambulatory Visit: Payer: Self-pay | Admitting: Cardiology

## 2023-10-26 ENCOUNTER — Other Ambulatory Visit: Payer: Self-pay | Admitting: Family Medicine

## 2023-10-26 DIAGNOSIS — I10 Essential (primary) hypertension: Secondary | ICD-10-CM

## 2023-10-27 DIAGNOSIS — M810 Age-related osteoporosis without current pathological fracture: Secondary | ICD-10-CM | POA: Diagnosis not present

## 2023-10-27 DIAGNOSIS — Z008 Encounter for other general examination: Secondary | ICD-10-CM | POA: Diagnosis not present

## 2023-10-27 DIAGNOSIS — G43909 Migraine, unspecified, not intractable, without status migrainosus: Secondary | ICD-10-CM | POA: Diagnosis not present

## 2023-10-27 DIAGNOSIS — F411 Generalized anxiety disorder: Secondary | ICD-10-CM | POA: Diagnosis not present

## 2023-10-27 DIAGNOSIS — I2510 Atherosclerotic heart disease of native coronary artery without angina pectoris: Secondary | ICD-10-CM | POA: Diagnosis not present

## 2023-10-27 DIAGNOSIS — F17211 Nicotine dependence, cigarettes, in remission: Secondary | ICD-10-CM | POA: Diagnosis not present

## 2023-10-27 DIAGNOSIS — N1831 Chronic kidney disease, stage 3a: Secondary | ICD-10-CM | POA: Diagnosis not present

## 2023-10-27 DIAGNOSIS — E041 Nontoxic single thyroid nodule: Secondary | ICD-10-CM | POA: Diagnosis not present

## 2023-10-27 DIAGNOSIS — J4520 Mild intermittent asthma, uncomplicated: Secondary | ICD-10-CM | POA: Diagnosis not present

## 2023-11-04 ENCOUNTER — Other Ambulatory Visit: Payer: Self-pay | Admitting: Cardiology

## 2023-11-04 DIAGNOSIS — I10 Essential (primary) hypertension: Secondary | ICD-10-CM

## 2023-12-10 ENCOUNTER — Other Ambulatory Visit: Payer: Self-pay | Admitting: Family Medicine

## 2023-12-10 ENCOUNTER — Other Ambulatory Visit: Payer: Self-pay | Admitting: Cardiology

## 2023-12-10 DIAGNOSIS — N3281 Overactive bladder: Secondary | ICD-10-CM

## 2023-12-10 DIAGNOSIS — I10 Essential (primary) hypertension: Secondary | ICD-10-CM

## 2023-12-15 ENCOUNTER — Other Ambulatory Visit: Payer: Self-pay | Admitting: Family Medicine

## 2023-12-15 ENCOUNTER — Other Ambulatory Visit: Payer: Self-pay | Admitting: Cardiology

## 2023-12-15 DIAGNOSIS — H524 Presbyopia: Secondary | ICD-10-CM | POA: Diagnosis not present

## 2023-12-15 DIAGNOSIS — N3281 Overactive bladder: Secondary | ICD-10-CM

## 2023-12-15 DIAGNOSIS — H26492 Other secondary cataract, left eye: Secondary | ICD-10-CM | POA: Diagnosis not present

## 2023-12-15 DIAGNOSIS — Z135 Encounter for screening for eye and ear disorders: Secondary | ICD-10-CM | POA: Diagnosis not present

## 2023-12-15 DIAGNOSIS — I10 Essential (primary) hypertension: Secondary | ICD-10-CM

## 2024-01-05 NOTE — Progress Notes (Unsigned)
 Cardiology Office Note:    Date:  01/06/2024   ID:  Cynthia Montgomery, DOB 11-Jan-1951, MRN [id number]  PCP:  Frann Mabel Mt, DO  Cardiologist:  Redell Leiter, MD    Referring MD: Frann Mabel Mt*    ASSESSMENT:    1. Mild CAD   2. RBBB (right bundle branch block)   3. Essential hypertension   4. Mixed hyperlipidemia    PLAN:    In order of problems listed above:  Doing well she has mild nonobstructive CAD rare anginal discomfort continue current medical therapy including aspirin low-dose beta-blocker and/or statin along with nitroglycerin  if needed She has a chronic pattern bifascicular heart block that is uncommon to progress to complete 1% a year but I told her if she ever to have a syncopal episode she need to contact her physicians.  Syncope in the setting of bifascicular heart block problems permanent pacemaker Well-controlled in my office encouraged to check daily for 2 weeks validated device good technique and send it to me through MyChart Continue her current high intensity statin   Next appointment: I will plan to see him in 1 year   Medication Adjustments/Labs and Tests Ordered: Current medicines are reviewed at length with the patient today.  Concerns regarding medicines are outlined above.  Orders Placed This Encounter  Procedures   EKG 12-Lead   No orders of the defined types were placed in this encounter.    History of Present Illness:    Cynthia Montgomery is a 73 y.o. female with a hx of mild nonobstructive CAD with right bundle branch block LAHB hypertension and hyperlipidemia.  Last seen 12/22/2022. Compliance with diet, lifestyle and medications: Yes  She has had a very good year she knows watching 68-year-old granddaughter Had 1 episode of nonexertional chest pain took a nitroglycerin  and quickly relieved Otherwise doing well and not having shortness of breath edema palpitation or syncope She does not check home blood pressure and I  have encouraged her to start using her device check daily for 1 week good technique and send a copy of the readings to me She is on a high intensity statin recent lipid profile LDL 104 cholesterol 176 She has had no muscle pain or weakness Past Medical History:  Diagnosis Date   Abnormal myocardial perfusion study 07/22/2018   Acute pharyngitis 09/26/2017   Adiposity 09/26/2013   Age-related osteoporosis without current pathological fracture 07/15/2022   AKI (acute kidney injury) 01/24/2017   Arthrofibrosis of knee joint, right 10/11/2013   Asthma    Cervical strain 03/11/2022   Chronic back pain 08/26/2019   Edema, unspecified 09/26/2013   Essential hypertension    Dx 30 ys ago; normal BP 130/60 (with med compliance)   Exertional chest pain 07/22/2018   Facet arthropathy, lumbosacral 06/15/2015   GAD (generalized anxiety disorder) 07/15/2022   Gastro-esophageal reflux disease without esophagitis 09/26/2013   Gastroesophageal reflux disease 09/26/2013   Gonalgia 09/26/2013   Heart murmur 02/27/2016   History of cataracts    bilateral; s/p surgery   History of colon polyps    Hyperlipidemia 04/23/2020   Hypokalemia 09/26/2013   Insomnia disorder, with other sleep disorder, recurrent 12/22/2022   Intervertebral disc degeneration, 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   Left knee pain 04/06/2007   Left thyroid  nodule 10/28/2017   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   Non-restorative sleep 12/22/2022   Nonintractable episodic headache 07/27/2017   Nuclear sclerotic cataract of left eye 07/13/2014   OA (osteoarthritis) of knee  s/p left TKR 09/2010   OAB (overactive bladder) 07/19/2019   Obesity, Class II, BMI 35-39.9 08/26/2019   Positive TB test    Post-concussion headache 03/11/2022   Primary osteoarthritis of right knee 07/19/2013   Pure hypercholesterolemia 04/06/2007   Annotation: reports a history, and that she was on a medication for it   RBBB (right bundle branch block) 07/22/2018   S/P  total knee replacement, right 08/26/2016   Sacroiliac joint dysfunction of left side 04/24/2021   Severe obesity (BMI 35.0-35.9 with comorbidity) (HCC) 09/26/2013   Snoring 08/26/2019   STOP BANG 4   Strain of lumbar region 04/24/2021   Syncope 01/24/2017   Transaminitis 01/24/2017   Xerostomia 10/28/2017    Current Medications: Current Meds  Medication Sig   albuterol  (VENTOLIN  HFA) 108 (90 Base) MCG/ACT inhaler Inhale 2 puffs into the lungs every 6 (six) hours as needed for wheezing or shortness of breath.   aspirin EC 81 MG tablet Take 81 mg by mouth daily.   baclofen  (LIORESAL ) 10 MG tablet TAKE 1 TABLET BY MOUTH THREE TIMES DAILY   cholecalciferol (VITAMIN D) 400 units TABS tablet Take 400 Units by mouth daily.   citalopram  (CELEXA ) 20 MG tablet Take 1 tablet by mouth once daily   fluticasone  (FLONASE ) 50 MCG/ACT nasal spray Place 2 sprays into both nostrils daily.   metoprolol  succinate (TOPROL -XL) 50 MG 24 hr tablet Take 1 tablet by mouth once daily   Multiple Vitamin (MULTIVITAMIN) tablet Take 1 tablet by mouth daily.   nitroGLYCERIN  (NITROSTAT ) 0.4 MG SL tablet Place 1 tablet (0.4 mg total) under the tongue every 5 (five) minutes x 3 doses as needed for chest pain.   Olmesartan -amLODIPine -HCTZ 40-5-25 MG TABS Take 1 tablet by mouth daily.   oxybutynin  (DITROPAN  XL) 15 MG 24 hr tablet Take 15 mg by mouth at bedtime.   pantoprazole  (PROTONIX ) 40 MG tablet Take 1 tablet (40 mg total) by mouth daily.   rosuvastatin  (CRESTOR ) 10 MG tablet Take 1 tablet (10 mg total) by mouth daily.      EKGs/Labs/Other Studies Reviewed:    The following studies were reviewed today:  Cardiac Studies & Procedures   ______________________________________________________________________________________________          CT SCANS  CT CORONARY MORPH W/CTA COR W/SCORE 09/21/2018  Addendum 09/21/2018  2:57 PM ADDENDUM REPORT: 09/21/2018 14:54  CLINICAL DATA:  Chest pain  EXAM: Cardiac  CTA  MEDICATIONS: Sub lingual nitro. 4 mg and lopressor  5mg   TECHNIQUE: The patient was scanned on a [employer] 192 scanner. Gantry rotation speed was 250 msecs. Collimation was. 6 mm . A 120 kV prospective scan was triggered in the ascending thoracic aorta at 140 HU's with full mA between 30-70% of the R-R interval . Average HR during the scan was 53 bpm. The 3D data set was interpreted on a dedicated work station using MPR, MIP and VRT modes. A total of 80 cc of contrast was used.  FINDINGS: Non-cardiac: See separate report from [facility]. No significant findings on limited lung and soft tissue windows.  Calcium  score: Calcium  noted in proximal LAD  Coronary Arteries: Right dominant with no anomalies  LM: Normal  LAD: 1-24% calcified plaque in proximal LAD  D1: Normal  D2: Normal  Circumflex: Normal  OM1: Normal  OM2: Normal  RCA: Normal  PDA: Normal  PLA: Normal  IMPRESSION: 1. Calcium  score 50 isolated to proximal LAD this is 32 th percentile for age and sex  2.  Normal  aortic root 3.6 cm  3.  CAD RADS 1 non obstructive CAD in proximal LAD  Cynthia Montgomery   Electronically Signed By: Cynthia Montgomery M.D. On: 09/21/2018 14:54  Narrative EXAM: OVER-READ INTERPRETATION  CT CHEST  The following report is an over-read performed by radiologist Dr. Waddell Calk of [facility] on 09/21/2018. This over-read does not include interpretation of cardiac or coronary anatomy or pathology. The coronary calcium  score/coronary CTA interpretation by the cardiologist is attached.  COMPARISON:  None  FINDINGS: Vascular: Cardiac enlargement.  No pericardial effusion.  Mediastinum/Nodes: No enlarged mediastinal or axillary lymph nodes. Thyroid  gland, trachea, and esophagus demonstrate no significant findings.  Lungs/Pleura: No pleural effusion identified. No airspace consolidation. Scar identified within the lingula and medial  right lower lobe.  Upper Abdomen: No acute abnormality.  Musculoskeletal: Spondylosis within the thoracic spine. No aggressive lytic or sclerotic bone lesions.  IMPRESSION: 1. No active cardiopulmonary abnormalities. 2. Cardiac enlargement.  Electronically Signed: By: Waddell Calk M.D. On: 09/21/2018 14:35     ______________________________________________________________________________________________      EKG Interpretation Date/Time:  Wednesday January 06 2024 14:25:01 EDT Ventricular Rate:  75 PR Interval:  196 QRS Duration:  126 QT Interval:  416 QTC Calculation: 464 R Axis:   -62  Text Interpretation: Normal sinus rhythm Right bundle branch block Left anterior fascicular block Bifascicular block When compared with ECG of 22-Dec-2022 13:00, PR interval has decreased Confirmed by Monetta Rogue ([id number]) on 01/06/2024 2:29:41 PM   Recent Labs: 09/16/2023: ALT 16; BUN 23; Creatinine, Ser 1.13; Potassium 3.9; Sodium 137  Recent Lipid Panel    Component Value Date/Time   CHOL 176 09/16/2023 1119   CHOL 170 08/29/2019 1054   TRIG 83.0 09/16/2023 1119   HDL 55.00 09/16/2023 1119   HDL 68 08/29/2019 1054   CHOLHDL 3 09/16/2023 1119   VLDL 16.6 09/16/2023 1119   LDLCALC 104 (H) 09/16/2023 1119   LDLCALC 85 08/29/2019 1054  Non-HDL cholesterol 121 EKG Interpretation Date/Time:  Wednesday January 06 2024 14:25:01 EDT Ventricular Rate:  75 PR Interval:  196 QRS Duration:  126 QT Interval:  416 QTC Calculation: 464 R Axis:   -62  Text Interpretation: Normal sinus rhythm Right bundle branch block Left anterior fascicular block Bifascicular block When compared with ECG of 22-Dec-2022 13:00, PR interval has decreased Confirmed by Monetta Rogue ([id number]) on 01/06/2024 2:29:41 PM   Physical Exam:    VS:  BP (!) 150/80   Pulse 75   Ht 5' 2 (1.575 m)   Wt 197 lb 9.6 oz (89.6 kg)   SpO2 96%   BMI 36.14 kg/m     Wt Readings from Last 3 Encounters:  01/06/24 197 lb  9.6 oz (89.6 kg)  09/16/23 197 lb 12.8 oz (89.7 kg)  05/27/23 198 lb (89.8 kg)  Repeat by me 136/80  GEN:  Well nourished, well developed in no acute distress HEENT: Normal NECK: No JVD; No carotid bruits LYMPHATICS: No lymphadenopathy CARDIAC: RRR, no murmurs, rubs, gallops RESPIRATORY:  Clear to auscultation without rales, wheezing or rhonchi  ABDOMEN: Soft, non-tender, non-distended MUSCULOSKELETAL:  No edema; No deformity  SKIN: Warm and dry NEUROLOGIC:  Alert and oriented x 3 PSYCHIATRIC:  Normal affect    Signed, Rogue Monetta, MD  01/06/2024 2:41 PM    [facility]

## 2024-01-06 ENCOUNTER — Encounter: Payer: Self-pay | Admitting: Cardiology

## 2024-01-06 ENCOUNTER — Ambulatory Visit: Payer: Self-pay

## 2024-01-06 ENCOUNTER — Ambulatory Visit: Attending: Cardiology | Admitting: Cardiology

## 2024-01-06 VITALS — BP 150/80 | HR 75 | Ht 62.0 in | Wt 197.6 lb

## 2024-01-06 DIAGNOSIS — I10 Essential (primary) hypertension: Secondary | ICD-10-CM

## 2024-01-06 DIAGNOSIS — E782 Mixed hyperlipidemia: Secondary | ICD-10-CM | POA: Diagnosis not present

## 2024-01-06 DIAGNOSIS — I4510 Unspecified right bundle-branch block: Secondary | ICD-10-CM

## 2024-01-06 DIAGNOSIS — I2510 Atherosclerotic heart disease of native coronary artery without angina pectoris: Secondary | ICD-10-CM | POA: Diagnosis not present

## 2024-01-06 MED ORDER — METOPROLOL SUCCINATE ER 50 MG PO TB24
50.0000 mg | ORAL_TABLET | Freq: Every day | ORAL | 3 refills | Status: DC
Start: 2024-01-06 — End: 2024-01-25

## 2024-01-06 NOTE — Telephone Encounter (Signed)
 FYI Only or Action Required?: FYI only for provider.  Patient was last seen in primary care on 09/16/2023 by Frann Mabel Mt, DO.  Called Nurse Triage reporting Nasal Congestion.  Symptoms began several days ago.  Interventions attempted: Rest, hydration, or home remedies.  Symptoms are: gradually worsening.  Triage Disposition: See PCP When Office is Open (Within 3 Days)  Patient/caregiver understands and will follow disposition?: Yes Reason for Disposition  [1] Nasal discharge AND [2] present > 10 days  Answer Assessment - Initial Assessment Questions Pt got flu shot Monday. Since has had nasal congestion, sneezing, cough. Non-productive. States she would like to be seen same day as her husband. Scheduled for tomorrow.   1. ONSET: When did the nasal discharge start?      monday 2. AMOUNT: How much discharge is there?      mild 3. COUGH: Do you have a cough? If Yes, ask: Describe the color of your mucus. (e.g., clear, white, yellow, green)     Yes; non productive 4. RESPIRATORY DISTRESS: Describe your breathing.      normal 5. FEVER: Do you have a fever? If Yes, ask: What is your temperature, how was it measured, and when did it start?     denies 6. SEVERITY: Overall, how bad are you feeling right now? (e.g., doesn't interfere with normal activities, staying home from school/work, staying in bed)      Feels bad fatigued 7. OTHER SYMPTOMS: Do you have any other symptoms? (e.g., earache, mouth sores, sore throat, wheezing)     Possible R ear congestion  Protocols used: Common Cold-A-AH

## 2024-01-06 NOTE — Telephone Encounter (Signed)
 Appt scheduled

## 2024-01-06 NOTE — Patient Instructions (Addendum)
 Medication Instructions:  Your physician recommends that you continue on your current medications as directed. Please refer to the Current Medication list given to you today.  *If you need a refill on your cardiac medications before your next appointment, please call your pharmacy*  Lab Work: None If you have labs (blood work) drawn today and your tests are completely normal, you will receive your results only by: MyChart Message (if you have MyChart) OR A paper copy in the mail If you have any lab test that is abnormal or we need to change your treatment, we will call you to review the results.  Testing/Procedures: None  Follow-Up: At [facility], you and your health needs are our priority.  As part of our continuing mission to provide you with exceptional heart care, our providers are all part of one team.  This team includes your primary Cardiologist (physician) and Advanced Practice Providers or APPs (Physician Assistants and Nurse Practitioners) who all work together to provide you with the care you need, when you need it.  Your next appointment:   1 year(s)  Provider:   Redell Leiter, MD    We recommend signing up for the patient portal called MyChart.  Sign up information is provided on this After Visit Summary.  MyChart is used to connect with patients for Virtual Visits (Telemedicine).  Patients are able to view lab/test results, encounter notes, upcoming appointments, etc.  Non-urgent messages can be sent to your provider as well.   To learn more about what you can do with MyChart, go to [email or web address].   Other Instructions Please keep a BP log for 2 weeks and send by MyChart or mail.                          Name and DOB__________________________ Dr. Leiter [address] [place], [place] [address]  Blood Pressure Record Sheet To take your blood pressure, you will need a blood pressure machine. You can buy a blood pressure machine (blood pressure monitor)  at your clinic, drug store, or online. When choosing one, consider: An automatic monitor that has an arm cuff. A cuff that wraps snugly around your upper arm. You should be able to fit only one finger between your arm and the cuff. A device that stores blood pressure reading results. Do not choose a monitor that measures your blood pressure from your wrist or finger. Follow your health care provider's instructions for how to take your blood pressure. To use this form: Get one reading in the morning (a.m.) 1-2 hours after you take any medicines. Get one reading in the evening (p.m.) before supper.   Blood pressure log Date: _______________________  a.m. _____________________(1st reading) HR___________            p.m. _____________________(2nd reading) HR__________  Date: _______________________  a.m. _____________________(1st reading) HR___________            p.m. _____________________(2nd reading) HR__________  Date: _______________________  a.m. _____________________(1st reading) HR___________            p.m. _____________________(2nd reading) HR__________  Date: _______________________  a.m. _____________________(1st reading) HR___________            p.m. _____________________(2nd reading) HR__________  Date: _______________________  a.m. _____________________(1st reading) HR___________            p.m. _____________________(2nd reading) HR__________  Date: _______________________  a.m. _____________________(1st reading) HR___________            p.m. _____________________(2nd reading)  HR__________  Date: _______________________  a.m. _____________________(1st reading) HR___________            p.m. _____________________(2nd reading) HR__________   This information is not intended to replace advice given to you by your health care provider. Make sure you discuss any questions you have with your health care provider. Document Revised: 06/29/2019 Document Reviewed:  06/29/2019 Elsevier Patient Education  2021 Elsevier Inc.            [employer]  Tips to measure your blood pressure correctly  To determine whether you have hypertension, a medical professional will take a blood pressure reading. How you prepare for the test, the position of your arm, and other factors can change a blood pressure reading by 10% or more. That could be enough to hide high blood pressure, start you on a drug you don't really need, or lead your doctor to incorrectly adjust your medications. National and international guidelines offer specific instructions for measuring blood pressure. If a doctor, nurse, or medical assistant isn't doing it right, don't hesitate to ask him or her to get with the guidelines. Here's what you can do to ensure a correct reading:  Don't drink a caffeinated beverage or smoke during the 30 minutes before the test.  Sit quietly for five minutes before the test begins.  During the measurement, sit in a chair with your feet on the floor and your arm supported so your elbow is at about heart level.  The inflatable part of the cuff should completely cover at least 80% of your upper arm, and the cuff should be placed on bare skin, not over a shirt.  Don't talk during the measurement.  Have your blood pressure measured twice, with a brief break in between. If the readings are different by 5 points or more, have it done a third time. There are times to break these rules. If you sometimes feel lightheaded when getting out of bed in the morning or when you stand after sitting, you should have your blood pressure checked while seated and then while standing to see if it falls from one position to the next. Because blood pressure varies throughout the day, your doctor will rarely diagnose hypertension on the basis of a single reading. Instead, he or she will want to confirm the measurements on at least two occasions, usually within a few weeks of one another. The  exception to this rule is if you have a blood pressure reading of 180/110 mm Hg or higher. A result this high usually calls for prompt treatment. It's also a good idea to have your blood pressure measured in both arms at least once, since the reading in one arm (usually the right) may be higher than that in the left. A 2014 study in The American Journal of Medicine of nearly 3,400 people found average arm- to-arm differences in systolic blood pressure of about 5 points. The higher number should be used to make treatment decisions. In 2017, new guidelines from the American Heart Association, the [employer] of Cardiology, and nine other health organizations lowered the diagnosis of high blood pressure to 130/80 mm Hg or higher for all adults. The guidelines also redefined the various blood pressure categories to now include normal, elevated, Stage 1 hypertension, Stage 2 hypertension, and hypertensive crisis (see Blood pressure categories). Blood pressure categories  Blood pressure category SYSTOLIC (upper number)  DIASTOLIC (lower number)  Normal Less than 120 mm Hg and Less than 80 mm Hg  Elevated 120-129  mm Hg and Less than 80 mm Hg  High blood pressure: Stage 1 hypertension 130-139 mm Hg or 80-89 mm Hg  High blood pressure: Stage 2 hypertension 140 mm Hg or higher or 90 mm Hg or higher  Hypertensive crisis (consult your doctor immediately) Higher than 180 mm Hg and/or Higher than 120 mm Hg  Source: American Heart Association and American Stroke Association. For more on getting your blood pressure under control, buy Controlling Your Blood Pressure, a Special Health Report from [facility].

## 2024-01-06 NOTE — Addendum Note (Signed)
 Addended by: SHERRE ADE I on: 01/06/2024 02:53 PM   Modules accepted: Orders

## 2024-01-07 ENCOUNTER — Other Ambulatory Visit: Payer: Self-pay | Admitting: Cardiology

## 2024-01-07 ENCOUNTER — Encounter: Payer: Self-pay | Admitting: Family Medicine

## 2024-01-07 ENCOUNTER — Other Ambulatory Visit: Payer: Self-pay | Admitting: Family Medicine

## 2024-01-07 ENCOUNTER — Ambulatory Visit (INDEPENDENT_AMBULATORY_CARE_PROVIDER_SITE_OTHER): Admitting: Family Medicine

## 2024-01-07 VITALS — BP 130/80 | HR 72 | Temp 98.0°F | Resp 16 | Ht 62.0 in | Wt 198.0 lb

## 2024-01-07 DIAGNOSIS — F411 Generalized anxiety disorder: Secondary | ICD-10-CM

## 2024-01-07 DIAGNOSIS — Z79899 Other long term (current) drug therapy: Secondary | ICD-10-CM | POA: Diagnosis not present

## 2024-01-07 DIAGNOSIS — J0110 Acute frontal sinusitis, unspecified: Secondary | ICD-10-CM

## 2024-01-07 DIAGNOSIS — I10 Essential (primary) hypertension: Secondary | ICD-10-CM

## 2024-01-07 DIAGNOSIS — S81812A Laceration without foreign body, left lower leg, initial encounter: Secondary | ICD-10-CM | POA: Diagnosis not present

## 2024-01-07 DIAGNOSIS — Z87891 Personal history of nicotine dependence: Secondary | ICD-10-CM | POA: Diagnosis not present

## 2024-01-07 DIAGNOSIS — Z23 Encounter for immunization: Secondary | ICD-10-CM | POA: Diagnosis not present

## 2024-01-07 DIAGNOSIS — Z7982 Long term (current) use of aspirin: Secondary | ICD-10-CM | POA: Diagnosis not present

## 2024-01-07 MED ORDER — METHYLPREDNISOLONE ACETATE 80 MG/ML IJ SUSP
80.0000 mg | Freq: Once | INTRAMUSCULAR | Status: AC
  Administered 2024-01-07: 80 mg via INTRAMUSCULAR

## 2024-01-07 MED ORDER — DOXYCYCLINE HYCLATE 100 MG PO TABS: 100.0000 mg | ORAL_TABLET | Freq: Two times a day (BID) | ORAL | 0 refills | Status: AC

## 2024-01-07 NOTE — Progress Notes (Signed)
 Chief Complaint  Patient presents with   Nasal Congestion    Nasal Congestion    Cynthia Montgomery Slot here for URI complaints.  Duration: 2 weeks  Associated symptoms: sinus headache, sinus congestion, sinus pain, rhinorrhea, and coughing Denies: itchy watery eyes, ear pain, ear drainage, sore throat, wheezing, shortness of breath, myalgia, and fevers Treatment to date: Mucinex, INCS, SABA Sick contacts: No  Past Medical History:  Diagnosis Date   Abnormal myocardial perfusion study 07/22/2018   Acute pharyngitis 09/26/2017   Adiposity 09/26/2013   Age-related osteoporosis without current pathological fracture 07/15/2022   AKI (acute kidney injury) 01/24/2017   Arthrofibrosis of knee joint, right 10/11/2013   Asthma    Cervical strain 03/11/2022   Chronic back pain 08/26/2019   Edema, unspecified 09/26/2013   Essential hypertension    Dx 30 ys ago; normal BP 130/60 (with med compliance)   Exertional chest pain 07/22/2018   Facet arthropathy, lumbosacral 06/15/2015   GAD (generalized anxiety disorder) 07/15/2022   Gastro-esophageal reflux disease without esophagitis 09/26/2013   Gastroesophageal reflux disease 09/26/2013   Gonalgia 09/26/2013   Heart murmur 02/27/2016   History of cataracts    bilateral; s/p surgery   History of colon polyps    Hyperlipidemia 04/23/2020   Hypokalemia 09/26/2013   Insomnia disorder, with other sleep disorder, recurrent 12/22/2022   Intervertebral disc degeneration, 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   Left knee pain 04/06/2007   Left thyroid  nodule 10/28/2017   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   Non-restorative sleep 12/22/2022   Nonintractable episodic headache 07/27/2017   Nuclear sclerotic cataract of left eye 07/13/2014   OA (osteoarthritis) of knee    s/p left TKR 09/2010   OAB (overactive bladder) 07/19/2019   Obesity, Class II, BMI 35-39.9 08/26/2019   Positive TB test    Post-concussion headache 03/11/2022   Primary osteoarthritis of right knee  07/19/2013   Pure hypercholesterolemia 04/06/2007   Annotation: reports a history, and that she was on a medication for it   RBBB (right bundle branch block) 07/22/2018   S/P total knee replacement, right 08/26/2016   Sacroiliac joint dysfunction of left side 04/24/2021   Severe obesity (BMI 35.0-35.9 with comorbidity) (HCC) 09/26/2013   Snoring 08/26/2019   STOP BANG 4   Strain of lumbar region 04/24/2021   Syncope 01/24/2017   Transaminitis 01/24/2017   Xerostomia 10/28/2017    Objective BP 130/80 (BP Location: Left Arm, Cuff Size: Normal)   Pulse 72   Temp 98 F (36.7 C) (Oral)   Resp 16   Ht 5' 2 (1.575 m)   Wt 198 lb (89.8 kg)   SpO2 99%   BMI 36.21 kg/m  General: Awake, alert, appears stated age HEENT: AT, [place], ears patent b/l and TM's neg, nares patent w/o discharge, pharynx pink and without exudates, MMM, ttp over frontal sinuses b/l Neck: No masses or asymmetry Heart: RRR Lungs: CTAB, no accessory muscle use Psych: Age appropriate judgment and insight, normal mood and affect  Acute frontal sinusitis, recurrence not specified - Plan: doxycycline  (VIBRA -TABS) 100 MG tablet  2 weeks without improvement, will tx w 7 d of doxy, Depomedrol IM 80 mg today. Continue to push fluids, practice good hand hygiene, cover mouth when coughing. F/u prn. If starting to experience fevers, shaking, or shortness of breath, seek immediate care. Pt voiced understanding and agreement to the plan.  [name], DO 01/07/24 12:01 PM

## 2024-01-07 NOTE — Patient Instructions (Signed)
 Continue to push fluids, practice good hand hygiene, and cover your mouth if you cough.  If you start having fevers, shaking or shortness of breath, seek immediate care.  OK to take Tylenol  1000 mg (2 extra strength tabs) or 975 mg (3 regular strength tabs) every 6 hours as needed.  Stay on your Flonase .   Let us  know if you need anything.

## 2024-01-07 NOTE — Addendum Note (Signed)
 Addended by: ESTELLE GILLIS D on: 01/07/2024 12:19 PM   Modules accepted: Orders

## 2024-01-12 ENCOUNTER — Other Ambulatory Visit: Payer: Self-pay | Admitting: Family Medicine

## 2024-01-12 DIAGNOSIS — F411 Generalized anxiety disorder: Secondary | ICD-10-CM

## 2024-01-24 ENCOUNTER — Encounter: Payer: Self-pay | Admitting: Pharmacist

## 2024-01-24 NOTE — Progress Notes (Signed)
 Pharmacy Quality Measure Review  This patient is appearing on a report for being at risk of failing the adherence measure for cholesterol (statin) medications this calendar year.   Medication: rosuvastatin  Last fill date: 09/16/2023 for 90 day supply per refill report  Reviewed recent refill history in Dr Annemarie database. Actual last refill date was 12/13/2023 for 90 day supply. Patient has no refills remaining. Next appointment with PCP is not yet scheduled. Last visit was 09/16/2023 with recommendation for patient to follow up in 6 months.    Insurance report was not up to date. No action needed at this time.   Madelin Ray, PharmD Clinical Pharmacist [facility]  Population Health [phone]

## 2024-01-25 ENCOUNTER — Telehealth: Payer: Self-pay | Admitting: Cardiology

## 2024-01-25 DIAGNOSIS — I10 Essential (primary) hypertension: Secondary | ICD-10-CM

## 2024-01-25 MED ORDER — METOPROLOL SUCCINATE ER 50 MG PO TB24: 50.0000 mg | ORAL_TABLET | Freq: Every day | ORAL | 3 refills | Status: AC

## 2024-01-25 NOTE — Telephone Encounter (Signed)
 RX sent in

## 2024-01-25 NOTE — Telephone Encounter (Signed)
*  STAT* If patient is at the pharmacy, call can be transferred to refill team.   1. Which medications need to be refilled? (please list name of each medication and dose if known) metoprolol  succinate (TOPROL -XL) 50 MG 24 hr tablet    2. Would you like to learn more about the convenience, safety, & potential cost savings by using the [facility]? no   3. Are you open to using the [facility] (Type [facility].  ). no   4. Which pharmacy/location (including street and city if local pharmacy) is medication to be sent to?[facility] [address], [place] - [address]    5. Do they need a 30 day or 90 day supply? 90 day   Pt is out of medication

## 2024-01-25 NOTE — Progress Notes (Signed)
 Pt was seen on 10/16 by pcp no notes to follow up

## 2024-02-01 ENCOUNTER — Other Ambulatory Visit: Payer: Self-pay | Admitting: Family Medicine

## 2024-02-04 ENCOUNTER — Ambulatory Visit: Payer: Self-pay

## 2024-02-04 NOTE — Telephone Encounter (Signed)
 FYI Only or Action Required?: FYI only for provider: appointment scheduled on 02/05/24.  Patient was last seen in primary care on 01/07/2024 by Frann Mabel Mt, DO.  Called Nurse Triage reporting Back Pain.  Symptoms began intermittently chronic increasing frequency X 1 week, .  Interventions attempted: Prescription medications: Baclofen  and Rest, hydration, or home remedies.  Symptoms are: gradually worsening.  Triage Disposition: See PCP When Office is Open (Within 3 Days)  Patient/caregiver understands and will follow disposition?: Yes   Copied from CRM [id number]. Topic: Clinical - Red Word Triage >> Feb 04, 2024 10:07 AM Carlyon D wrote: Red Word that prompted transfer to Nurse Triage: pt is having Back pain. Reason for Disposition  [1] MODERATE back pain (e.g., interferes with normal activities) AND [2] present > 3 days  Answer Assessment - Initial Assessment Questions 1. ONSET: When did the pain begin? (e.g., minutes, hours, days)     Years but worsening over one week 2. LOCATION: Where does it hurt? (upper, mid or lower back)     Lower and upper between shoulders  3. SEVERITY: How bad is the pain?  (e.g., Scale 1-10; mild, moderate, or severe)     Burning sensation  4. PATTERN: Is the pain constant? (e.g., yes, no; constant, intermittent)      intermittent 5. RADIATION: Does the pain shoot into your legs or somewhere else?     If overusing arms she will get some tingling down her arm and worse tingling when laying on that side 6. CAUSE:  What do you think is causing the back pain?      disc 7. BACK OVERUSE:  Any recent lifting of heavy objects, strenuous work or exercise?     No, when standing for 20 minutes pain will intensify 8. MEDICINES: What have you taken so far for the pain? (e.g., nothing, acetaminophen , NSAIDS)     Baclofen  not helpful. She has not tried OTC pain relief or heat at this time.  9. NEUROLOGIC SYMPTOMS: Do you have any  weakness, numbness, or problems with bowel/bladder control?     Denies  10. OTHER SYMPTOMS: Do you have any other symptoms? (e.g., fever, abdomen pain, burning with urination, blood in urine)       Denies  Protocols used: Back Pain-A-AH

## 2024-02-05 ENCOUNTER — Ambulatory Visit: Admitting: Medical

## 2024-02-09 ENCOUNTER — Ambulatory Visit: Admitting: Family Medicine

## 2024-02-15 ENCOUNTER — Telehealth: Payer: Self-pay | Admitting: Adult Health

## 2024-02-15 NOTE — Telephone Encounter (Signed)
 LVM and sent mychart msg informing pt of need to reschedule 03/08/24 appt - NP out   If pt calls back you can offer a slot with Megan for 12/17 in the afternoon

## 2024-02-16 ENCOUNTER — Encounter: Payer: Self-pay | Admitting: Family Medicine

## 2024-02-16 ENCOUNTER — Ambulatory Visit: Admitting: Family Medicine

## 2024-02-16 VITALS — BP 134/82 | HR 80 | Temp 98.0°F | Resp 16 | Ht 62.0 in | Wt 197.6 lb

## 2024-02-16 DIAGNOSIS — H93A1 Pulsatile tinnitus, right ear: Secondary | ICD-10-CM

## 2024-02-16 DIAGNOSIS — M545 Low back pain, unspecified: Secondary | ICD-10-CM | POA: Diagnosis not present

## 2024-02-16 NOTE — Progress Notes (Signed)
 Chief Complaint  Patient presents with   Headache    Headaches and Back Pain    Subjective: Patient is a 74 y.o. female here for f/u ringing in the ears.  Started 6 mo ago. Saw ENT. Workup neg. Describes a heart beat buzz in her R ear. Hearing is OK. No trauma or loud noise exposure. No fullness in her face.  She has not had imaging for this.  Patient has been having bilateral low back pain.  No injury or change in activity.  No neurologic signs or symptoms.  Denies bruising, redness, swelling, or bowel/bladder incontinence.  She does not stretch routinely.  Past Medical History:  Diagnosis Date   Abnormal myocardial perfusion study 07/22/2018   Acute pharyngitis 09/26/2017   Adiposity 09/26/2013   Age-related osteoporosis without current pathological fracture 07/15/2022   AKI (acute kidney injury) 01/24/2017   Arthrofibrosis of knee joint, right 10/11/2013   Asthma    Cervical strain 03/11/2022   Chronic back pain 08/26/2019   Edema, unspecified 09/26/2013   Essential hypertension    Dx 30 ys ago; normal BP 130/60 (with med compliance)   Exertional chest pain 07/22/2018   Facet arthropathy, lumbosacral 06/15/2015   GAD (generalized anxiety disorder) 07/15/2022   Gastro-esophageal reflux disease without esophagitis 09/26/2013   Gastroesophageal reflux disease 09/26/2013   Gonalgia 09/26/2013   Heart murmur 02/27/2016   History of cataracts    bilateral; s/p surgery   History of colon polyps    Hyperlipidemia 04/23/2020   Hypokalemia 09/26/2013   Insomnia disorder, with other sleep disorder, recurrent 12/22/2022   Intervertebral disc degeneration, 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   Left knee pain 04/06/2007   Left thyroid  nodule 10/28/2017   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   Non-restorative sleep 12/22/2022   Nonintractable episodic headache 07/27/2017   Nuclear sclerotic cataract of left eye 07/13/2014   OA (osteoarthritis) of knee    s/p left TKR 09/2010   OAB (overactive bladder)  07/19/2019   Obesity, Class II, BMI 35-39.9 08/26/2019   Positive TB test    Post-concussion headache 03/11/2022   Primary osteoarthritis of right knee 07/19/2013   Pure hypercholesterolemia 04/06/2007   Annotation: reports a history, and that she was on a medication for it   RBBB (right bundle branch block) 07/22/2018   S/P total knee replacement, right 08/26/2016   Sacroiliac joint dysfunction of left side 04/24/2021   Severe obesity (BMI 35.0-35.9 with comorbidity) (HCC) 09/26/2013   Snoring 08/26/2019   STOP BANG 4   Strain of lumbar region 04/24/2021   Syncope 01/24/2017   Transaminitis 01/24/2017   Xerostomia 10/28/2017    Objective: BP 134/82 (BP Location: Left Arm, Patient Position: Sitting)   Pulse 80   Temp 98 F (36.7 C) (Oral)   Resp 16   Ht 5' 2 (1.575 m)   Wt 197 lb 9.6 oz (89.6 kg)   SpO2 98%   BMI 36.14 kg/m  General: Awake, appears stated age Ears: Patent, no otorrhea, TMs negative Mouth: MMM MSK: TTP over the lower thoracic and lumbar paraspinal musculature.  No bony tenderness.  Or hamstring range of motion bilaterally. Neuro: Negative straight leg bilaterally.  DTRs equal and symmetric throughout, no clonus, no cerebellar signs, gait is normal, 5/5 strength throughout. Lungs: CTAB, no rales, wheezes or rhonchi. No accessory muscle use Psych: Age appropriate judgment and insight, normal affect and mood  Assessment and Plan: Pulsatile tinnitus of right ear - Plan: MR Angiogram Head Wo Contrast  Acute bilateral low back pain  without sciatica  New diagnosis with uncertain prognosis.  Check MRA as above. Heat, ice, Tylenol , stretches and exercises provided.  Physical therapy if no improvement in next month or so. The patient voiced understanding and agreement to the plan.  Mabel Mt [place], DO 02/16/24  2:26 PM

## 2024-02-16 NOTE — Patient Instructions (Addendum)
 Someone will reach out regarding your imaging.   Heat (pad or rice pillow in microwave) over affected area, 10-15 minutes twice daily.   Ice/cold pack over area for 10-15 min twice daily.  OK to take Tylenol  1000 mg (2 extra strength tabs) or 975 mg (3 regular strength tabs) every 6 hours as needed.  Let us  know if you need anything.  EXERCISES  RANGE OF MOTION (ROM) AND STRETCHING EXERCISES - Low Back Pain Most people with lower back pain will find that their symptoms get worse with excessive bending forward (flexion) or arching at the lower back (extension). The exercises that will help resolve your symptoms will focus on the opposite motion.  If you have pain, numbness or tingling which travels down into your buttocks, leg or foot, the goal of the therapy is for these symptoms to move closer to your back and eventually resolve. Sometimes, these leg symptoms will get better, but your lower back pain may worsen. This is often an indication of progress in your rehabilitation. Be very alert to any changes in your symptoms and the activities in which you participated in the 24 hours prior to the change. Sharing this information with your caregiver will allow him or her to most efficiently treat your condition. These exercises may help you when beginning to rehabilitate your injury. Your symptoms may resolve with or without further involvement from your physician, physical therapist or athletic trainer. While completing these exercises, remember:  Restoring tissue flexibility helps normal motion to return to the joints. This allows healthier, less painful movement and activity. An effective stretch should be held for at least 30 seconds. A stretch should never be painful. You should only feel a gentle lengthening or release in the stretched tissue. FLEXION RANGE OF MOTION AND STRETCHING EXERCISES:  STRETCH - Flexion, Single Knee to Chest  Lie on a firm bed or floor with both legs extended in front  of you. Keeping one leg in contact with the floor, bring your opposite knee to your chest. Hold your leg in place by either grabbing behind your thigh or at your knee. Pull until you feel a gentle stretch in your low back. Hold 30 seconds. Slowly release your grasp and repeat the exercise with the opposite side. Repeat 2 times. Complete this exercise 3 times per week.   STRETCH - Flexion, Double Knee to Chest Lie on a firm bed or floor with both legs extended in front of you. Keeping one leg in contact with the floor, bring your opposite knee to your chest. Tense your stomach muscles to support your back and then lift your other knee to your chest. Hold your legs in place by either grabbing behind your thighs or at your knees. Pull both knees toward your chest until you feel a gentle stretch in your low back. Hold 30 seconds. Tense your stomach muscles and slowly return one leg at a time to the floor. Repeat 2 times. Complete this exercise 3 times per week.   STRETCH - Low Trunk Rotation Lie on a firm bed or floor. Keeping your legs in front of you, bend your knees so they are both pointed toward the ceiling and your feet are flat on the floor. Extend your arms out to the side. This will stabilize your upper body by keeping your shoulders in contact with the floor. Gently and slowly drop both knees together to one side until you feel a gentle stretch in your low back. Hold for 30 seconds.  Tense your stomach muscles to support your lower back as you bring your knees back to the starting position. Repeat the exercise to the other side. Repeat 2 times. Complete this exercise at least 3 times per week.   EXTENSION RANGE OF MOTION AND FLEXIBILITY EXERCISES:  STRETCH - Extension, Prone on Elbows  Lie on your stomach on the floor, a bed will be too soft. Place your palms about shoulder width apart and at the height of your head. Place your elbows under your shoulders. If this is too painful, stack  pillows under your chest. Allow your body to relax so that your hips drop lower and make contact more completely with the floor. Hold this position for 30 seconds. Slowly return to lying flat on the floor. Repeat 2 times. Complete this exercise 3 times per week.   RANGE OF MOTION - Extension, Prone Press Ups Lie on your stomach on the floor, a bed will be too soft. Place your palms about shoulder width apart and at the height of your head. Keeping your back as relaxed as possible, slowly straighten your elbows while keeping your hips on the floor. You may adjust the placement of your hands to maximize your comfort. As you gain motion, your hands will come more underneath your shoulders. Hold this position 30 seconds. Slowly return to lying flat on the floor. Repeat 2 times. Complete this exercise 3 times per week.   RANGE OF MOTION- Quadruped, Neutral Spine  Assume a hands and knees position on a firm surface. Keep your hands under your shoulders and your knees under your hips. You may place padding under your knees for comfort. Drop your head and point your tailbone toward the ground below you. This will round out your lower back like an angry cat. Hold this position for 30 seconds. Slowly lift your head and release your tail bone so that your back sags into a large arch, like an old horse. Hold this position for 30 seconds. Repeat this until you feel limber in your low back. Now, find your sweet spot. This will be the most comfortable position somewhere between the two previous positions. This is your neutral spine. Once you have found this position, tense your stomach muscles to support your low back. Hold this position for 30 seconds. Repeat 2 times. Complete this exercise 3 times per week.   STRENGTHENING EXERCISES - Low Back Sprain These exercises may help you when beginning to rehabilitate your injury. These exercises should be done near your sweet spot. This is the neutral,  low-back arch, somewhere between fully rounded and fully arched, that is your least painful position. When performed in this safe range of motion, these exercises can be used for people who have either a flexion or extension based injury. These exercises may resolve your symptoms with or without further involvement from your physician, physical therapist or athletic trainer. While completing these exercises, remember:  Muscles can gain both the endurance and the strength needed for everyday activities through controlled exercises. Complete these exercises as instructed by your physician, physical therapist or athletic trainer. Increase the resistance and repetitions only as guided. You may experience muscle soreness or fatigue, but the pain or discomfort you are trying to eliminate should never worsen during these exercises. If this pain does worsen, stop and make certain you are following the directions exactly. If the pain is still present after adjustments, discontinue the exercise until you can discuss the trouble with your caregiver.  STRENGTHENING -  Deep Abdominals, Pelvic Tilt  Lie on a firm bed or floor. Keeping your legs in front of you, bend your knees so they are both pointed toward the ceiling and your feet are flat on the floor. Tense your lower abdominal muscles to press your low back into the floor. This motion will rotate your pelvis so that your tail bone is scooping upwards rather than pointing at your feet or into the floor. With a gentle tension and even breathing, hold this position for 3 seconds. Repeat 2 times. Complete this exercise 3 times per week.   STRENGTHENING - Abdominals, Crunches  Lie on a firm bed or floor. Keeping your legs in front of you, bend your knees so they are both pointed toward the ceiling and your feet are flat on the floor. Cross your arms over your chest. Slightly tip your chin down without bending your neck. Tense your abdominals and slowly lift your  trunk high enough to just clear your shoulder blades. Lifting higher can put excessive stress on the lower back and does not further strengthen your abdominal muscles. Control your return to the starting position. Repeat 2 times. Complete this exercise 3 times per week.   STRENGTHENING - Quadruped, Opposite UE/LE Lift  Assume a hands and knees position on a firm surface. Keep your hands under your shoulders and your knees under your hips. You may place padding under your knees for comfort. Find your neutral spine and gently tense your abdominal muscles so that you can maintain this position. Your shoulders and hips should form a rectangle that is parallel with the floor and is not twisted. Keeping your trunk steady, lift your right hand no higher than your shoulder and then your left leg no higher than your hip. Make sure you are not holding your breath. Hold this position for 30 seconds. Continuing to keep your abdominal muscles tense and your back steady, slowly return to your starting position. Repeat with the opposite arm and leg. Repeat 2 times. Complete this exercise 3 times per week.   STRENGTHENING - Abdominals and Quadriceps, Straight Leg Raise  Lie on a firm bed or floor with both legs extended in front of you. Keeping one leg in contact with the floor, bend the other knee so that your foot can rest flat on the floor. Find your neutral spine, and tense your abdominal muscles to maintain your spinal position throughout the exercise. Slowly lift your straight leg off the floor about 6 inches for a count of 3, making sure to not hold your breath. Still keeping your neutral spine, slowly lower your leg all the way to the floor. Repeat this exercise with each leg 2 times. Complete this exercise 3 times per week.  POSTURE AND BODY MECHANICS CONSIDERATIONS - Low Back Sprain Keeping correct posture when sitting, standing or completing your activities will reduce the stress put on different body  tissues, allowing injured tissues a chance to heal and limiting painful experiences. The following are general guidelines for improved posture.  While reading these guidelines, remember: The exercises prescribed by your provider will help you have the flexibility and strength to maintain correct postures. The correct posture provides the best environment for your joints to work. All of your joints have less wear and tear when properly supported by a spine with good posture. This means you will experience a healthier, less painful body. Correct posture must be practiced with all of your activities, especially prolonged sitting and standing. Correct posture is  as important when doing repetitive low-stress activities (typing) as it is when doing a single heavy-load activity (lifting).  RESTING POSITIONS Consider which positions are most painful for you when choosing a resting position. If you have pain with flexion-based activities (sitting, bending, stooping, squatting), choose a position that allows you to rest in a less flexed posture. You would want to avoid curling into a fetal position on your side. If your pain worsens with extension-based activities (prolonged standing, working overhead), avoid resting in an extended position such as sleeping on your stomach. Most people will find more comfort when they rest with their spine in a more neutral position, neither too rounded nor too arched. Lying on a non-sagging bed on your side with a pillow between your knees, or on your back with a pillow under your knees will often provide some relief. Keep in mind, being in any one position for a prolonged period of time, no matter how correct your posture, can still lead to stiffness.  PROPER SITTING POSTURE In order to minimize stress and discomfort on your spine, you must sit with correct posture. Sitting with good posture should be effortless for a healthy body. Returning to good posture is a gradual process.  Many people can work toward this most comfortably by using various supports until they have the flexibility and strength to maintain this posture on their own. When sitting with proper posture, your ears will fall over your shoulders and your shoulders will fall over your hips. You should use the back of the chair to support your upper back. Your lower back will be in a neutral position, just slightly arched. You may place a small pillow or folded towel at the base of your lower back for  support.  When working at a desk, create an environment that supports good, upright posture. Without extra support, muscles tire, which leads to excessive strain on joints and other tissues. Keep these recommendations in mind:  CHAIR: A chair should be able to slide under your desk when your back makes contact with the back of the chair. This allows you to work closely. The chair's height should allow your eyes to be level with the upper part of your monitor and your hands to be slightly lower than your elbows.  BODY POSITION Your feet should make contact with the floor. If this is not possible, use a foot rest. Keep your ears over your shoulders. This will reduce stress on your neck and low back.  INCORRECT SITTING POSTURES  If you are feeling tired and unable to assume a healthy sitting posture, do not slouch or slump. This puts excessive strain on your back tissues, causing more damage and pain. Healthier options include: Using more support, like a lumbar pillow. Switching tasks to something that requires you to be upright or walking. Talking a brief walk. Lying down to rest in a neutral-spine position.  PROLONGED STANDING WHILE SLIGHTLY LEANING FORWARD  When completing a task that requires you to lean forward while standing in one place for a long time, place either foot up on a stationary 2-4 inch high object to help maintain the best posture. When both feet are on the ground, the lower back tends to lose  its slight inward curve. If this curve flattens (or becomes too large), then the back and your other joints will experience too much stress, tire more quickly, and can cause pain.  CORRECT STANDING POSTURES Proper standing posture should be assumed with all daily activities,  even if they only take a few moments, like when brushing your teeth. As in sitting, your ears should fall over your shoulders and your shoulders should fall over your hips. You should keep a slight tension in your abdominal muscles to brace your spine. Your tailbone should point down to the ground, not behind your body, resulting in an over-extended swayback posture.   INCORRECT STANDING POSTURES  Common incorrect standing postures include a forward head, locked knees and/or an excessive swayback. WALKING Walk with an upright posture. Your ears, shoulders and hips should all line-up.  PROLONGED ACTIVITY IN A FLEXED POSITION When completing a task that requires you to bend forward at your waist or lean over a low surface, try to find a way to stabilize 3 out of 4 of your limbs. You can place a hand or elbow on your thigh or rest a knee on the surface you are reaching across. This will provide you more stability, so that your muscles do not tire as quickly. By keeping your knees relaxed, or slightly bent, you will also reduce stress across your lower back. CORRECT LIFTING TECHNIQUES  DO : Assume a wide stance. This will provide you more stability and the opportunity to get as close as possible to the object which you are lifting. Tense your abdominals to brace your spine. Bend at the knees and hips. Keeping your back locked in a neutral-spine position, lift using your leg muscles. Lift with your legs, keeping your back straight. Test the weight of unknown objects before attempting to lift them. Try to keep your elbows locked down at your sides in order get the best strength from your shoulders when carrying an object.   Always  ask for help when lifting heavy or awkward objects. INCORRECT LIFTING TECHNIQUES DO NOT:  Lock your knees when lifting, even if it is a small object. Bend and twist. Pivot at your feet or move your feet when needing to change directions. Assume that you can safely pick up even a paperclip without proper posture.

## 2024-03-08 ENCOUNTER — Telehealth: Payer: Medicare Other | Admitting: Adult Health

## 2024-03-09 ENCOUNTER — Other Ambulatory Visit: Payer: Self-pay | Admitting: Family Medicine

## 2024-03-13 ENCOUNTER — Ambulatory Visit (HOSPITAL_BASED_OUTPATIENT_CLINIC_OR_DEPARTMENT_OTHER)
Admission: RE | Admit: 2024-03-13 | Discharge: 2024-03-13 | Disposition: A | Discharge status: Home / Self Care | Source: Ambulatory Visit | Attending: Family Medicine | Admitting: Family Medicine

## 2024-03-13 DIAGNOSIS — H93A1 Pulsatile tinnitus, right ear: Secondary | ICD-10-CM | POA: Diagnosis present

## 2024-03-18 ENCOUNTER — Ambulatory Visit: Payer: Self-pay | Admitting: Family Medicine

## 2024-04-10 ENCOUNTER — Other Ambulatory Visit: Payer: Self-pay | Admitting: Family Medicine

## 2024-04-10 DIAGNOSIS — E785 Hyperlipidemia, unspecified: Secondary | ICD-10-CM

## 2024-04-11 ENCOUNTER — Other Ambulatory Visit: Payer: Self-pay | Admitting: Family Medicine

## 2024-04-11 DIAGNOSIS — I10 Essential (primary) hypertension: Secondary | ICD-10-CM

## 2024-04-21 ENCOUNTER — Other Ambulatory Visit: Payer: Self-pay | Admitting: Family Medicine

## 2024-04-21 DIAGNOSIS — K219 Gastro-esophageal reflux disease without esophagitis: Secondary | ICD-10-CM

## 2024-04-26 ENCOUNTER — Ambulatory Visit: Payer: Medicare Other

## 2024-04-26 ENCOUNTER — Other Ambulatory Visit: Payer: Self-pay

## 2024-04-26 VITALS — BP 130/64 | HR 67 | Temp 98.1°F | Ht 62.0 in | Wt 195.5 lb

## 2024-04-26 DIAGNOSIS — Z0000 Encounter for general adult medical examination without abnormal findings: Secondary | ICD-10-CM | POA: Diagnosis not present

## 2024-04-26 DIAGNOSIS — M8580 Other specified disorders of bone density and structure, unspecified site: Secondary | ICD-10-CM

## 2024-04-26 NOTE — Patient Instructions (Addendum)
 [name],  Thank you for taking the time for your Medicare Wellness Visit. I appreciate your continued commitment to your health goals. Please review the care plan we discussed, and feel free to reach out if I can assist you further.  Please note that Annual Wellness Visits do not include a physical exam. Some assessments may be limited, especially if the visit was conducted virtually. If needed, we may recommend an in-person follow-up with your provider.  Ongoing Care Seeing your primary care provider every 3 to 6 months helps us  monitor your health and provide consistent, personalized care.   Referrals If a referral was made during today's visit and you haven't received any updates within two weeks, please contact the referred provider directly to check on the status.  Recommended Screenings:  Health Maintenance  Topic Date Due   DTaP/Tdap/Td vaccine (2 - Td or Tdap) 04/02/2025   Medicare Annual Wellness Visit  04/26/2025   Breast Cancer Screening  09/22/2025   Colon Cancer Screening  07/12/2030   Pneumococcal Vaccine for age over 29  Completed   Flu Shot  Completed   Osteoporosis screening with Bone Density Scan  Completed   Hepatitis C Screening  Completed   Zoster (Shingles) Vaccine  Completed   Meningitis B Vaccine  Aged Out   COVID-19 Vaccine  Discontinued       04/26/2024    3:43 PM  Advanced Directives  Does Patient Have a Medical Advance Directive? Yes  Type of Estate Agent of [place];Living will  Does patient want to make changes to medical advance directive? No - Patient declined  Copy of Healthcare Power of Attorney in Chart? No - copy requested    Vision: Annual vision screenings are recommended for early detection of glaucoma, cataracts, and diabetic retinopathy. These exams can also reveal signs of chronic conditions such as diabetes and high blood pressure.  Dental: Annual dental screenings help detect early signs of oral cancer, gum  disease, and other conditions linked to overall health, including heart disease and diabetes.  Please see the attached documents for additional preventive care recommendations.

## 2025-05-02 ENCOUNTER — Ambulatory Visit
